# Patient Record
Sex: Male | Born: 1955
Health system: Southern US, Community
[De-identification: ages and names within clinical notes are randomized; demographics above are authoritative.]

## PROBLEM LIST (undated history)

## (undated) ENCOUNTER — Ambulatory Visit: Payer: MEDICARE

## (undated) ENCOUNTER — Telehealth

## (undated) ENCOUNTER — Encounter

## (undated) ENCOUNTER — Ambulatory Visit

## (undated) ENCOUNTER — Ambulatory Visit: Attending: Internal Medicine | Primary: Internal Medicine

## (undated) ENCOUNTER — Ambulatory Visit
Payer: MEDICARE | Attending: Student in an Organized Health Care Education/Training Program | Primary: Student in an Organized Health Care Education/Training Program

## (undated) ENCOUNTER — Inpatient Hospital Stay

## (undated) ENCOUNTER — Encounter
Attending: Student in an Organized Health Care Education/Training Program | Primary: Student in an Organized Health Care Education/Training Program

## (undated) ENCOUNTER — Telehealth
Attending: Student in an Organized Health Care Education/Training Program | Primary: Student in an Organized Health Care Education/Training Program

## (undated) ENCOUNTER — Encounter: Attending: Internal Medicine | Primary: Internal Medicine

## (undated) ENCOUNTER — Telehealth: Attending: Urology | Primary: Urology

## (undated) ENCOUNTER — Ambulatory Visit: Payer: MEDICARE | Attending: Anesthesiology | Primary: Anesthesiology

## (undated) ENCOUNTER — Telehealth: Attending: Family | Primary: Family

## (undated) ENCOUNTER — Ambulatory Visit: Payer: MEDICARE | Attending: Vascular Surgery | Primary: Vascular Surgery

## (undated) ENCOUNTER — Encounter: Payer: MEDICARE | Attending: Pulmonary Disease | Primary: Pulmonary Disease

## (undated) ENCOUNTER — Encounter: Attending: Adult Health | Primary: Adult Health

## (undated) ENCOUNTER — Telehealth: Attending: Internal Medicine | Primary: Internal Medicine

## (undated) ENCOUNTER — Non-Acute Institutional Stay: Payer: MEDICARE

## (undated) ENCOUNTER — Ambulatory Visit: Payer: MEDICARE | Attending: Internal Medicine | Primary: Internal Medicine

## (undated) ENCOUNTER — Ambulatory Visit: Attending: Pharmacist | Primary: Pharmacist

## (undated) ENCOUNTER — Encounter: Attending: Registered" | Primary: Registered"

## (undated) ENCOUNTER — Ambulatory Visit: Attending: Nurse Practitioner | Primary: Nurse Practitioner

## (undated) ENCOUNTER — Encounter: Attending: Anesthesiology | Primary: Anesthesiology

## (undated) ENCOUNTER — Ambulatory Visit: Payer: MEDICARE | Attending: Family | Primary: Family

## (undated) ENCOUNTER — Encounter: Attending: Pharmacist | Primary: Pharmacist

## (undated) ENCOUNTER — Encounter: Attending: Family | Primary: Family

## (undated) ENCOUNTER — Encounter: Payer: MEDICARE | Attending: Pediatrics | Primary: Pediatrics

## (undated) DIAGNOSIS — R519 Headache, unspecified: Secondary | ICD-10-CM

## (undated) DIAGNOSIS — I2510 Atherosclerotic heart disease of native coronary artery without angina pectoris: Secondary | ICD-10-CM

## (undated) DIAGNOSIS — E7800 Pure hypercholesterolemia, unspecified: Secondary | ICD-10-CM

## (undated) DIAGNOSIS — E119 Type 2 diabetes mellitus without complications: Secondary | ICD-10-CM

## (undated) DIAGNOSIS — Q2112 Patent foramen ovale: Secondary | ICD-10-CM

## (undated) DIAGNOSIS — F329 Major depressive disorder, single episode, unspecified: Secondary | ICD-10-CM

## (undated) DIAGNOSIS — F32A Depression, unspecified: Secondary | ICD-10-CM

## (undated) DIAGNOSIS — G4733 Obstructive sleep apnea (adult) (pediatric): Secondary | ICD-10-CM

## (undated) DIAGNOSIS — K279 Peptic ulcer, site unspecified, unspecified as acute or chronic, without hemorrhage or perforation: Secondary | ICD-10-CM

## (undated) DIAGNOSIS — R51 Headache: Secondary | ICD-10-CM

## (undated) DIAGNOSIS — I5030 Unspecified diastolic (congestive) heart failure: Secondary | ICD-10-CM

## (undated) DIAGNOSIS — Q211 Atrial septal defect: Secondary | ICD-10-CM

## (undated) DIAGNOSIS — I10 Essential (primary) hypertension: Secondary | ICD-10-CM

## (undated) HISTORY — PX: SHOULDER SURGERY: SHX246

## (undated) HISTORY — PX: CORONARY ANGIOPLASTY WITH STENT PLACEMENT: SHX49

## (undated) HISTORY — PX: PROSTATE SURGERY: SHX751

## (undated) HISTORY — PX: OTHER SURGICAL HISTORY: SHX169

## (undated) HISTORY — PX: JOINT REPLACEMENT: SHX530

## (undated) HISTORY — PX: HERNIA REPAIR: SHX51

## (undated) MED ORDER — MEDICAL SUPPLY ITEM: Freq: Every evening | 0 days

## (undated) MED ORDER — SULFAMETHOXAZOLE 400 MG-TRIMETHOPRIM 80 MG TABLET: Freq: Two times a day (BID) | ORAL | 0 days

## (undated) MED ORDER — OZEMPIC 0.25 MG OR 0.5 MG (2 MG/1.5 ML) SUBCUTANEOUS PEN INJECTOR: SUBCUTANEOUS | 0 days

## (undated) MED ORDER — ISOSORBIDE DINITRATE ORAL: ORAL | 0.00000 days

## (undated) MED ORDER — TESTOSTERONE CYPIONATE 200 MG/ML INTRAMUSCULAR OIL: INTRAMUSCULAR | 0 days

## (undated) MED ORDER — BUPROPION HCL 100 MG TABLET: Freq: Two times a day (BID) | ORAL | 0.00000 days

## (undated) MED ORDER — DULOXETINE 30 MG CAPSULE,DELAYED RELEASE: Freq: Every day | ORAL | 0 days

## (undated) MED ORDER — FARXIGA ORAL: Freq: Every day | ORAL | 0 days

## (undated) MED ORDER — ROPINIROLE 2 MG TABLET: Freq: Every evening | ORAL | 0 days

## (undated) MED ORDER — LISINOPRIL 10 MG TABLET: Freq: Every day | ORAL | 0 days

---

## 2006-12-03 ENCOUNTER — Encounter: Admission: RE | Admit: 2006-12-03 | Discharge: 2006-12-03 | Payer: Self-pay | Admitting: Specialist

## 2010-03-10 ENCOUNTER — Encounter: Payer: Self-pay | Admitting: Specialist

## 2012-04-10 DIAGNOSIS — K219 Gastro-esophageal reflux disease without esophagitis: Secondary | ICD-10-CM

## 2012-04-10 DIAGNOSIS — E785 Hyperlipidemia, unspecified: Secondary | ICD-10-CM | POA: Insufficient documentation

## 2012-04-10 DIAGNOSIS — I10 Essential (primary) hypertension: Secondary | ICD-10-CM | POA: Insufficient documentation

## 2012-04-10 DIAGNOSIS — E1165 Type 2 diabetes mellitus with hyperglycemia: Secondary | ICD-10-CM | POA: Insufficient documentation

## 2012-04-10 HISTORY — DX: Essential (primary) hypertension: I10

## 2012-04-10 HISTORY — DX: Gastro-esophageal reflux disease without esophagitis: K21.9

## 2012-07-02 DIAGNOSIS — I219 Acute myocardial infarction, unspecified: Secondary | ICD-10-CM | POA: Insufficient documentation

## 2012-07-02 DIAGNOSIS — I252 Old myocardial infarction: Secondary | ICD-10-CM | POA: Insufficient documentation

## 2013-02-24 ENCOUNTER — Encounter (HOSPITAL_COMMUNITY): Payer: Self-pay | Admitting: Emergency Medicine

## 2013-02-24 ENCOUNTER — Inpatient Hospital Stay (HOSPITAL_COMMUNITY)
Admission: EM | Admit: 2013-02-24 | Discharge: 2013-02-25 | DRG: 313 | Disposition: A | Discharge status: Home / Self Care | Payer: Medicare PPO | Attending: Internal Medicine | Admitting: Internal Medicine

## 2013-02-24 ENCOUNTER — Emergency Department (HOSPITAL_COMMUNITY): Payer: Medicare PPO

## 2013-02-24 DIAGNOSIS — Z79899 Other long term (current) drug therapy: Secondary | ICD-10-CM

## 2013-02-24 DIAGNOSIS — Z7982 Long term (current) use of aspirin: Secondary | ICD-10-CM

## 2013-02-24 DIAGNOSIS — I200 Unstable angina: Secondary | ICD-10-CM

## 2013-02-24 DIAGNOSIS — R079 Chest pain, unspecified: Secondary | ICD-10-CM | POA: Diagnosis present

## 2013-02-24 DIAGNOSIS — I10 Essential (primary) hypertension: Secondary | ICD-10-CM

## 2013-02-24 DIAGNOSIS — F329 Major depressive disorder, single episode, unspecified: Secondary | ICD-10-CM | POA: Diagnosis present

## 2013-02-24 DIAGNOSIS — M545 Low back pain, unspecified: Secondary | ICD-10-CM | POA: Diagnosis present

## 2013-02-24 DIAGNOSIS — I2510 Atherosclerotic heart disease of native coronary artery without angina pectoris: Secondary | ICD-10-CM | POA: Diagnosis present

## 2013-02-24 DIAGNOSIS — G8929 Other chronic pain: Secondary | ICD-10-CM | POA: Diagnosis present

## 2013-02-24 DIAGNOSIS — E785 Hyperlipidemia, unspecified: Secondary | ICD-10-CM | POA: Diagnosis present

## 2013-02-24 DIAGNOSIS — Z8249 Family history of ischemic heart disease and other diseases of the circulatory system: Secondary | ICD-10-CM

## 2013-02-24 DIAGNOSIS — E119 Type 2 diabetes mellitus without complications: Secondary | ICD-10-CM | POA: Diagnosis present

## 2013-02-24 DIAGNOSIS — R0789 Other chest pain: Principal | ICD-10-CM | POA: Diagnosis present

## 2013-02-24 DIAGNOSIS — Z9861 Coronary angioplasty status: Secondary | ICD-10-CM

## 2013-02-24 DIAGNOSIS — I509 Heart failure, unspecified: Secondary | ICD-10-CM

## 2013-02-24 DIAGNOSIS — F3289 Other specified depressive episodes: Secondary | ICD-10-CM

## 2013-02-24 HISTORY — DX: Essential (primary) hypertension: I10

## 2013-02-24 HISTORY — DX: Pure hypercholesterolemia, unspecified: E78.00

## 2013-02-24 HISTORY — DX: Type 2 diabetes mellitus without complications: E11.9

## 2013-02-24 HISTORY — DX: Atherosclerotic heart disease of native coronary artery without angina pectoris: I25.10

## 2013-02-24 HISTORY — DX: Peptic ulcer, site unspecified, unspecified as acute or chronic, without hemorrhage or perforation: K27.9

## 2013-02-24 HISTORY — DX: Unstable angina: I20.0

## 2013-02-24 LAB — CBC
HCT: 42.3 % (ref 39.0–52.0)
Hemoglobin: 14.4 g/dL (ref 13.0–17.0)
MCH: 29.6 pg (ref 26.0–34.0)
MCHC: 34 g/dL (ref 30.0–36.0)
MCV: 86.9 fL (ref 78.0–100.0)
Platelets: 222 10*3/uL (ref 150–400)
RBC: 4.87 MIL/uL (ref 4.22–5.81)
RDW: 14.3 % (ref 11.5–15.5)
WBC: 7.8 10*3/uL (ref 4.0–10.5)

## 2013-02-24 LAB — COMPREHENSIVE METABOLIC PANEL
ALBUMIN: 4 g/dL (ref 3.5–5.2)
ALK PHOS: 101 U/L (ref 39–117)
ALT: 23 U/L (ref 0–53)
AST: 16 U/L (ref 0–37)
BUN: 14 mg/dL (ref 6–23)
CO2: 27 meq/L (ref 19–32)
Calcium: 9.9 mg/dL (ref 8.4–10.5)
Chloride: 99 mEq/L (ref 96–112)
Creatinine, Ser: 0.9 mg/dL (ref 0.50–1.35)
GFR calc Af Amer: >90 mL/min (ref >90)
GFR calc non Af Amer: >90 mL/min (ref >90)
GLUCOSE: 133 mg/dL — AB (ref 70–99)
Potassium: 4.3 mEq/L (ref 3.7–5.3)
SODIUM: 140 meq/L (ref 137–147)
Total Bilirubin: 0.4 mg/dL (ref 0.3–1.2)
Total Protein: 7.5 g/dL (ref 6.0–8.3)

## 2013-02-24 LAB — PROTIME-INR
INR: 1.01 (ref 0.00–1.49)
Prothrombin Time: 13.1 seconds (ref 11.6–15.2)

## 2013-02-24 LAB — APTT: aPTT: 26 seconds (ref 24–37)

## 2013-02-24 LAB — POCT I-STAT TROPONIN I
Troponin i, poc: 0 ng/mL (ref 0.00–0.08)
Troponin i, poc: 0 ng/mL (ref 0.00–0.08)

## 2013-02-24 LAB — PRO B NATRIURETIC PEPTIDE: PRO B NATRI PEPTIDE: 29.1 pg/mL (ref 0–125)

## 2013-02-24 LAB — GLUCOSE, CAPILLARY: Glucose-Capillary: 172 mg/dL — ABNORMAL HIGH (ref 70–99)

## 2013-02-24 LAB — TROPONIN I

## 2013-02-24 IMAGING — CR DG CHEST 2V
2 series · 2 of 2 positions shown · non-contrast
Comparison: [DATE] PA and lateral chest x-ray

CLINICAL DATA: Chest pain with history of cardiac stent placement

EXAM:
CHEST  2 VIEW

[w chest pa]
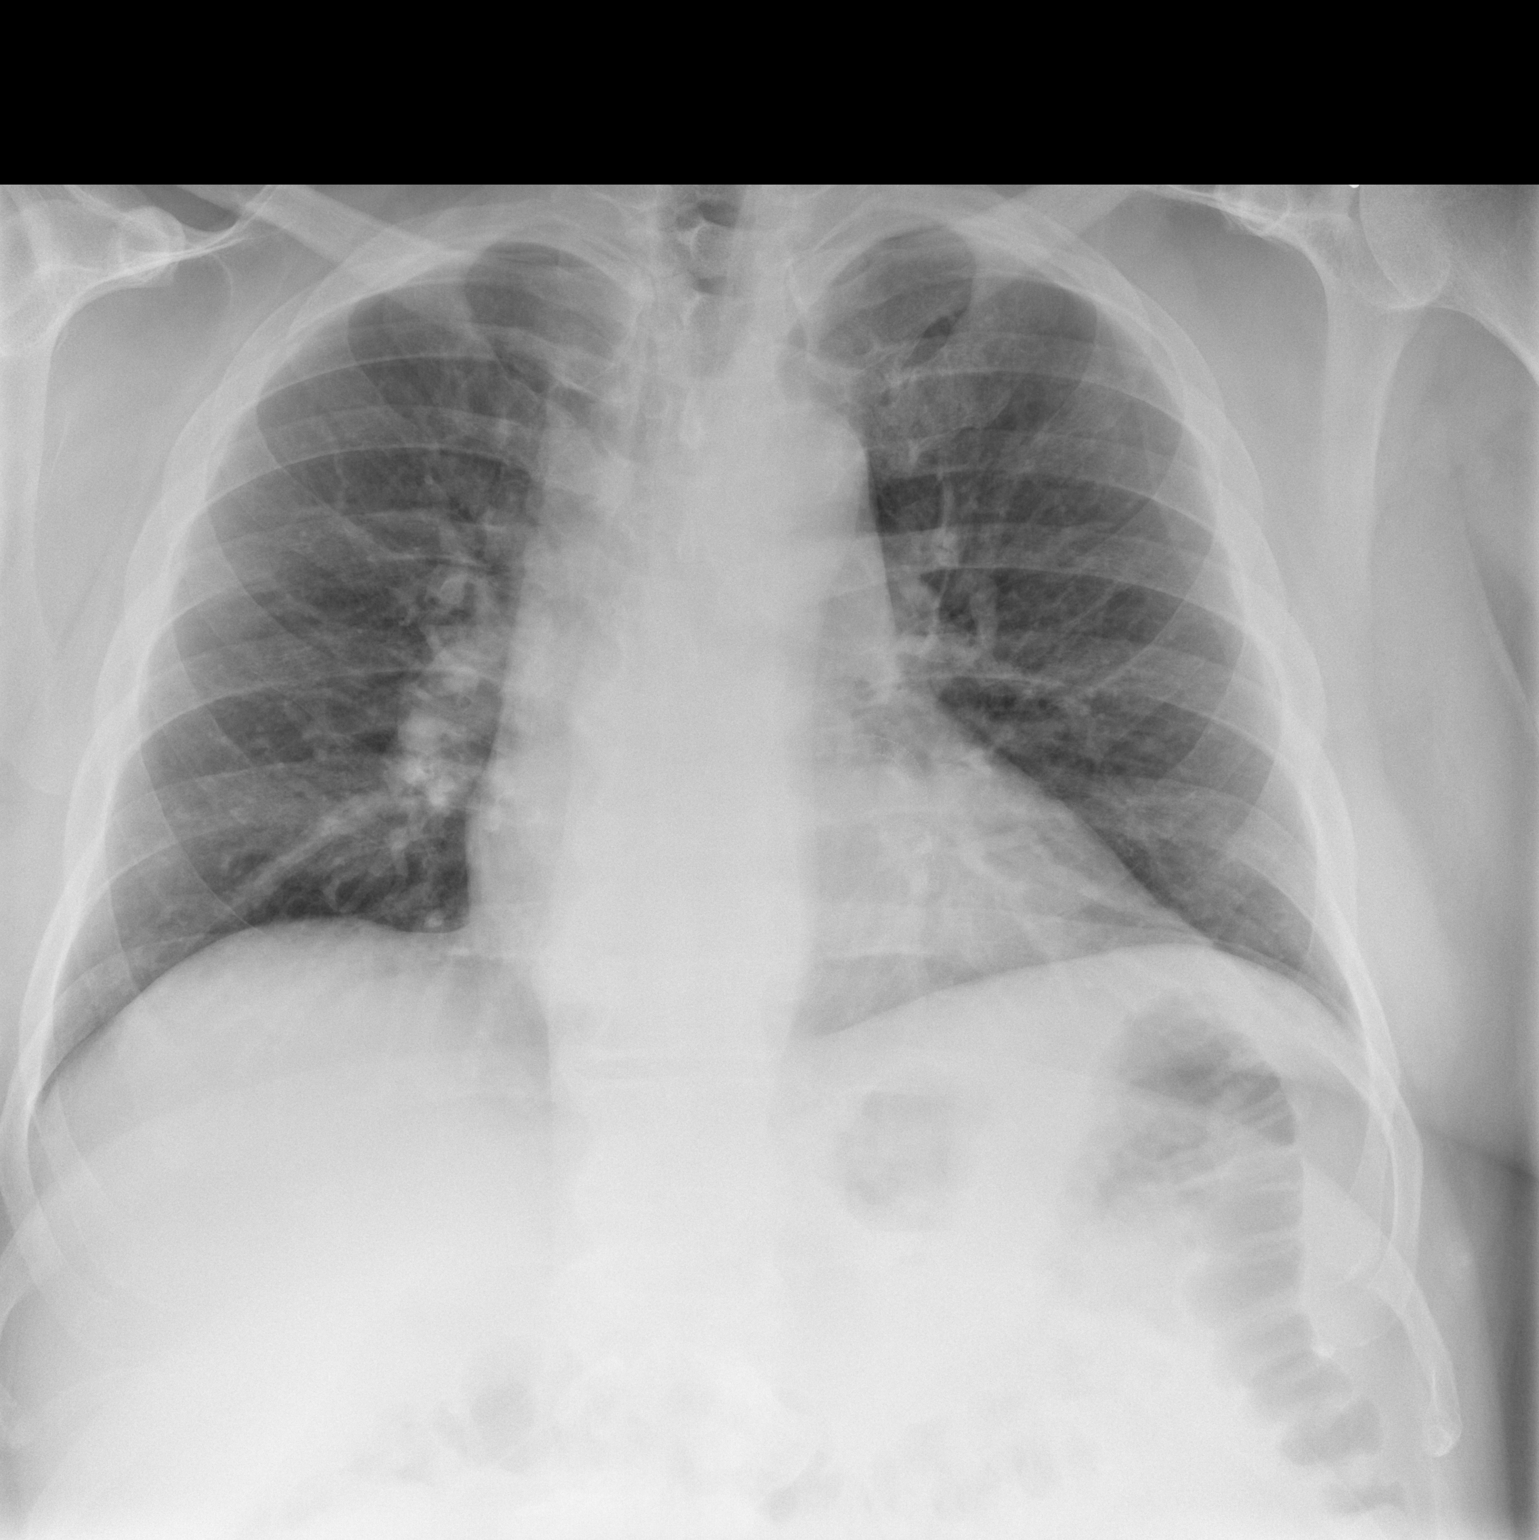

[w chest lat]
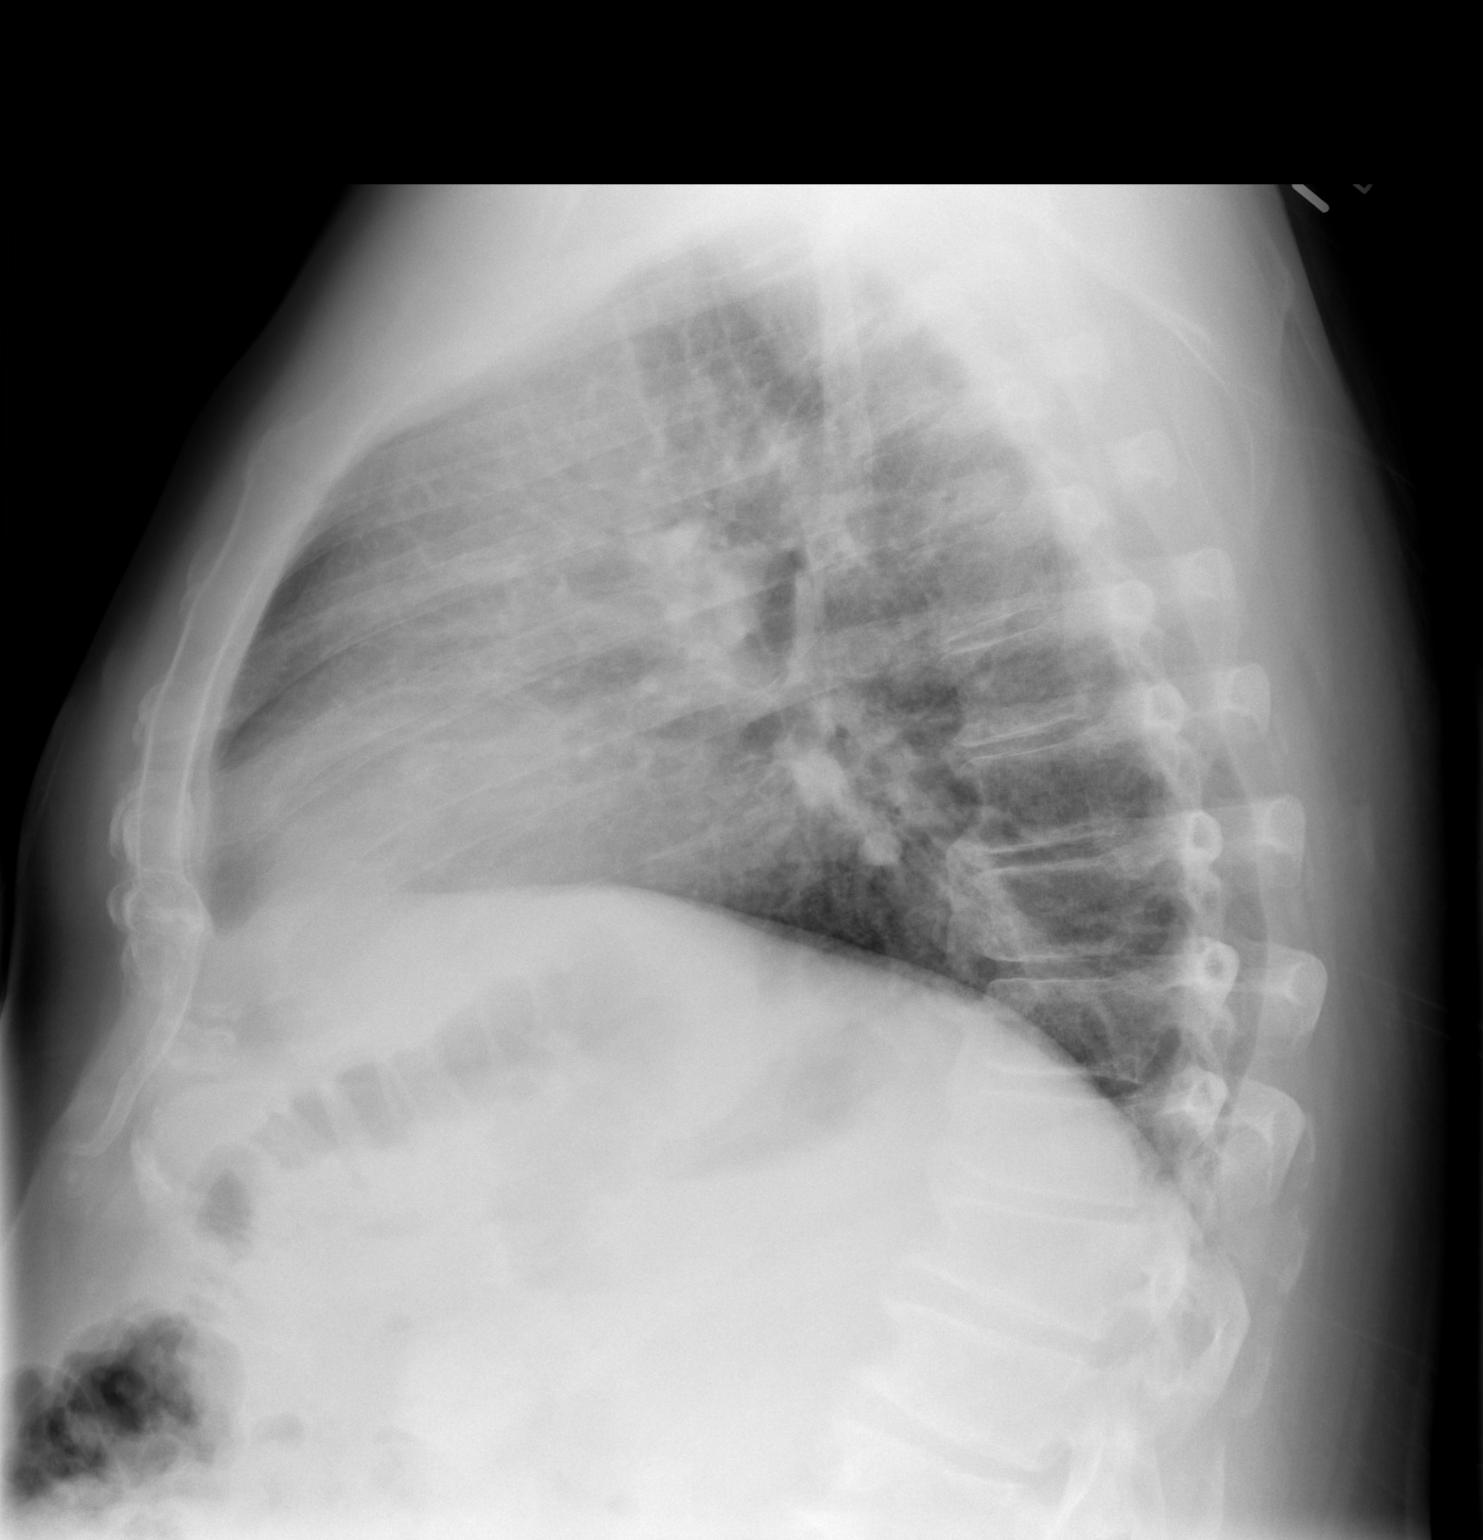

[2 of 2 positions shown; findings below may reference images not displayed]

FINDINGS: The lungs are borderline hypoinflated. The interstitial markings are
mildly increased but not greatly changed from the previous study.
The cardiopericardial silhouette is enlarged though stable. The
central pulmonary vascularity is prominent. There is no pleural
effusion or pneumothorax. The mediastinum is normal in width. There
is tortuosity of the descending thoracic aorta.
IMPRESSION: The findings suggest low-grade CHF with mild pulmonary interstitial
edema. There is no alveolar pneumonia.

## 2013-02-24 MED ORDER — ONDANSETRON HCL 4 MG/2ML IJ SOLN
4.0000 mg | Freq: Once | INTRAMUSCULAR | Status: AC
  Administered 2013-02-24: 4 mg via INTRAVENOUS
  Filled 2013-02-24: qty 2

## 2013-02-24 MED ORDER — METOCLOPRAMIDE HCL 5 MG/ML IJ SOLN
5.0000 mg | Freq: Once | INTRAMUSCULAR | Status: AC
  Administered 2013-02-24: 5 mg via INTRAVENOUS
  Filled 2013-02-24: qty 2

## 2013-02-24 MED ORDER — GABAPENTIN 300 MG PO CAPS
300.0000 mg | ORAL_CAPSULE | Freq: Two times a day (BID) | ORAL | Status: DC
  Administered 2013-02-25 (×2): 300 mg via ORAL
  Filled 2013-02-24 (×3): qty 1

## 2013-02-24 MED ORDER — ONDANSETRON HCL 4 MG/2ML IJ SOLN
4.0000 mg | Freq: Four times a day (QID) | INTRAMUSCULAR | Status: DC | PRN
Start: 2013-02-24 — End: 2013-02-25
  Administered 2013-02-24: 4 mg via INTRAVENOUS

## 2013-02-24 MED ORDER — CLOPIDOGREL BISULFATE 75 MG PO TABS
75.0000 mg | ORAL_TABLET | Freq: Every day | ORAL | Status: DC
  Administered 2013-02-25: 75 mg via ORAL
  Filled 2013-02-24: qty 1

## 2013-02-24 MED ORDER — SODIUM CHLORIDE 0.9 % IJ SOLN: 3.0000 mL | Freq: Two times a day (BID) | INTRAMUSCULAR | Status: DC

## 2013-02-24 MED ORDER — ACETAMINOPHEN 325 MG PO TABS
650.0000 mg | ORAL_TABLET | ORAL | Status: DC | PRN
  Administered 2013-02-24: 650 mg via ORAL
  Filled 2013-02-24: qty 2

## 2013-02-24 MED ORDER — RAMELTEON 8 MG PO TABS
8.0000 mg | ORAL_TABLET | Freq: Every day | ORAL | Status: DC
  Filled 2013-02-24: qty 1

## 2013-02-24 MED ORDER — DULOXETINE HCL 60 MG PO CPEP
60.0000 mg | ORAL_CAPSULE | Freq: Every day | ORAL | Status: DC
  Administered 2013-02-24 – 2013-02-25 (×2): 60 mg via ORAL
  Filled 2013-02-24 (×2): qty 1

## 2013-02-24 MED ORDER — ASPIRIN EC 81 MG PO TBEC
81.0000 mg | DELAYED_RELEASE_TABLET | Freq: Every day | ORAL | Status: DC
  Administered 2013-02-25: 81 mg via ORAL
  Filled 2013-02-24: qty 1

## 2013-02-24 MED ORDER — NITROGLYCERIN 0.3 MG SL SUBL
0.3000 mg | SUBLINGUAL_TABLET | SUBLINGUAL | Status: DC | PRN
  Administered 2013-02-24: 0.3 mg via SUBLINGUAL
  Filled 2013-02-24: qty 100

## 2013-02-24 MED ORDER — NITROGLYCERIN IN D5W 200-5 MCG/ML-% IV SOLN
INTRAVENOUS | Status: AC
  Filled 2013-02-24: qty 250

## 2013-02-24 MED ORDER — PAROXETINE HCL 20 MG PO TABS
20.0000 mg | ORAL_TABLET | Freq: Every day | ORAL | Status: DC
  Administered 2013-02-24 – 2013-02-25 (×2): 20 mg via ORAL
  Filled 2013-02-24 (×2): qty 1

## 2013-02-24 MED ORDER — FUROSEMIDE 10 MG/ML IJ SOLN
40.0000 mg | Freq: Once | INTRAMUSCULAR | Status: AC
  Administered 2013-02-24: 40 mg via INTRAVENOUS
  Filled 2013-02-24: qty 4

## 2013-02-24 MED ORDER — HYDROMORPHONE HCL PF 1 MG/ML IJ SOLN
0.5000 mg | Freq: Once | INTRAMUSCULAR | Status: AC
  Administered 2013-02-24: 0.5 mg via INTRAVENOUS
  Filled 2013-02-24: qty 1

## 2013-02-24 MED ORDER — ATORVASTATIN CALCIUM 80 MG PO TABS
80.0000 mg | ORAL_TABLET | Freq: Every day | ORAL | Status: DC
  Administered 2013-02-24 – 2013-02-25 (×2): 80 mg via ORAL
  Filled 2013-02-24 (×2): qty 1

## 2013-02-24 MED ORDER — HEPARIN (PORCINE) IN NACL 100-0.45 UNIT/ML-% IJ SOLN
1500.0000 [IU]/h | INTRAMUSCULAR | Status: DC
  Administered 2013-02-24: 1500 [IU]/h via INTRAVENOUS
  Administered 2013-02-25: 1800 [IU]/h via INTRAVENOUS
  Filled 2013-02-24 (×3): qty 250

## 2013-02-24 MED ORDER — ISOSORBIDE MONONITRATE ER 30 MG PO TB24
30.0000 mg | ORAL_TABLET | Freq: Every day | ORAL | Status: DC
  Administered 2013-02-25: 30 mg via ORAL
  Filled 2013-02-24: qty 1

## 2013-02-24 MED ORDER — ASPIRIN 325 MG PO TABS
325.0000 mg | ORAL_TABLET | ORAL | Status: AC
  Administered 2013-02-24: 325 mg via ORAL
  Filled 2013-02-24: qty 1

## 2013-02-24 MED ORDER — ONDANSETRON HCL 4 MG/2ML IJ SOLN
INTRAMUSCULAR | Status: AC
  Administered 2013-02-24: 18:00:00 4 mg via INTRAVENOUS
  Filled 2013-02-24: qty 2

## 2013-02-24 MED ORDER — TESTOSTERONE 20.25 MG/ACT (1.62%) TD GEL: 6.0000 | Freq: Every day | TRANSDERMAL | Status: DC

## 2013-02-24 MED ORDER — MORPHINE SULFATE 4 MG/ML IJ SOLN
4.0000 mg | INTRAMUSCULAR | Status: AC
  Administered 2013-02-24: 4 mg via INTRAVENOUS
  Filled 2013-02-24: qty 1

## 2013-02-24 MED ORDER — GABAPENTIN 300 MG PO CAPS
900.0000 mg | ORAL_CAPSULE | Freq: Every day | ORAL | Status: DC
  Administered 2013-02-24: 900 mg via ORAL
  Filled 2013-02-24 (×2): qty 3

## 2013-02-24 MED ORDER — NITROGLYCERIN IN D5W 200-5 MCG/ML-% IV SOLN
2.0000 ug/min | INTRAVENOUS | Status: DC
  Administered 2013-02-24: 20 ug/min via INTRAVENOUS

## 2013-02-24 MED ORDER — RANOLAZINE ER 500 MG PO TB12
1000.0000 mg | ORAL_TABLET | Freq: Two times a day (BID) | ORAL | Status: DC
  Administered 2013-02-24 – 2013-02-25 (×2): 1000 mg via ORAL
  Filled 2013-02-24 (×3): qty 2

## 2013-02-24 MED ORDER — PROMETHAZINE HCL 25 MG/ML IJ SOLN
12.5000 mg | Freq: Once | INTRAMUSCULAR | Status: AC
  Administered 2013-02-24: 12.5 mg via INTRAVENOUS
  Filled 2013-02-24: qty 1

## 2013-02-24 MED ORDER — HEPARIN BOLUS VIA INFUSION
4000.0000 [IU] | Freq: Once | INTRAVENOUS | Status: AC
  Administered 2013-02-24: 4000 [IU] via INTRAVENOUS
  Filled 2013-02-24: qty 4000

## 2013-02-24 NOTE — ED Notes (Addendum)
Attempted to call report to care nurse, but nurse not available currently. HUC indicated that RN will call this RN when available.

## 2013-02-24 NOTE — H&P (Signed)
Date: 02/24/2013               Patient Name:  Benjamin Bush MRN: [id number]  DOB: July 20, 1955 Age / Sex: 58 y.o., male   PCP: No Pcp Per Patient         Medical Service: Internal Medicine Teaching Service         Attending Physician: Dr. Inez Catalina, MD    First Contact: Dr. Darci Needle Pager: [phone]  Second Contact: Dr. Virgina Organ Pager: [phone]       After Hours (After 5p/  First Contact Pager: [phone]  weekends / holidays): Second Contact Pager: [phone]   Chief Complaint: chest pain  History of Present Illness:  This is a 57yo WM w/ PMH HTN, DMT2, HLD, CHF (unknown echo results), known CAD w/ 2 stents (2014) who presents with chest pain.  Patient is visiting from [place] area to see his wife's cousin who is currently hospitalized. He has been in his usual sate of health until this morning when he was sitting in the waiting room when he had sudden onset of L sharp chest pain w/ associated SOB and nausea, no diaphoresis. Pt took 1 NTG tablet which improved the pain, but did not relieve it completely so he went to the ED. In the ED patient was given another NTG tablet which did not help. Patient got some morphine IV which brought the pain down to a 2/10. The pain has gradually worsened again and is now a 4/10. He has some residual nausea, no vomiting. Pain has been constant since onset and does not radiate anywhere. He has some chronic lower back pain, no changes. Patient has intermittent BLE swelling and he notes his legs were swollen a few days ago, but this resolved after taking lasix 20mg  (which he takes prn at home). Patient is compliant with all medications and took all medications this morning, however he has been out of his Ranexa x 4 days. Denies URI symptoms.   In the ED, afebrile, BP 129/67, HR 59, RR 20, satting 94% on 2LPM. BMP and CBC wnl. Trop neg x 1. BNP wnl. CXR showed mild interstitial edema. Patient received lasix 40mg  IV, ASA 325mg , morphine, dilauded, reglan, and  phenergan.  Of note, patient tells [place] he is in the process of getting home O2 set up, but denies h/o lung diseases and is not sure why he requires supplemental oxygen.   Meds: Current Facility-Administered Medications  Medication Dose Route Frequency Provider Last Rate Last Dose  . nitroGLYCERIN (NITROSTAT) SL tablet 0.3 mg  0.3 mg Sublingual Q5 min PRN Junius Argyle, MD   0.3 mg at 02/24/13 1143  . nitroGLYCERIN 0.2 mg/mL in dextrose 5 % infusion  2-200 mcg/min Intravenous Titrated Junius Argyle, MD      . nitroGLYCERIN 0.2 mg/mL in dextrose 5 % infusion            Current Outpatient Prescriptions  Medication Sig Dispense Refill  . amLODipine (NORVASC) 10 MG tablet Take 20 mg by mouth daily.      Marland Kitchen aspirin EC 81 MG tablet Take 81 mg by mouth daily.      Marland Kitchen atorvastatin (LIPITOR) 80 MG tablet Take 80 mg by mouth daily.      . clopidogrel (PLAVIX) 75 MG tablet Take 75 mg by mouth daily with breakfast.      . diclofenac sodium (VOLTAREN) 1 % GEL Apply 4 g topically 4 (four) times daily as needed.      Marland Kitchen  DULoxetine (CYMBALTA) 60 MG capsule Take 60 mg by mouth daily.      . furosemide (LASIX) 20 MG tablet Take 20 mg by mouth daily as needed. edema      . gabapentin (NEURONTIN) 300 MG capsule Take 300-900 mg by mouth 3 (three) times daily. 1 capsule at am and lunch and 3 capsules at bedtime.      Marland Kitchen glipiZIDE (GLUCOTROL XL) 10 MG 24 hr tablet Take 10 mg by mouth daily with breakfast.      . isosorbide mononitrate (IMDUR) 30 MG 24 hr tablet Take 30 mg by mouth daily.      . metFORMIN (GLUCOPHAGE) 500 MG tablet Take 500 mg by mouth 2 (two) times daily with a meal.      . metoprolol (LOPRESSOR) 100 MG tablet Take 100 mg by mouth 2 (two) times daily.      Marland Kitchen PARoxetine (PAXIL) 20 MG tablet Take 20 mg by mouth daily.      . potassium chloride SA (K-DUR,KLOR-CON) 20 MEQ tablet Take 20 mEq by mouth daily.      . ranolazine (RANEXA) 1000 MG SR tablet Take 1,000 mg by mouth 2 (two) times daily.       . Testosterone (ANDROGEL PUMP) 20.25 MG/ACT (1.62%) GEL Place 6 sprays onto the skin daily.       Allergies: Allergies as of 02/24/2013 - Review Complete 02/24/2013  Allergen Reaction Noted  . Codeine Swelling 02/24/2013   Past Medical History  Diagnosis Date  . Hypertension   . Diabetes mellitus without complication   . Hypercholesteremia   . Congestive heart failure (CHF)    Past Surgical History  Procedure Laterality Date  . Coronary angioplasty with stent placement    . Hernia repair    . Joint replacement Left     knee replacement  . Vetebral fusion     No family history on file. History   Social History  . Marital Status: Married    Spouse Name: N/A    Number of Children: N/A  . Years of Education: N/A   Occupational History  . Not on file.   Social History Main Topics  . Smoking status: Never Smoker   . Smokeless tobacco: Current User    Types: Chew  . Alcohol Use: No  . Drug Use: No  . Sexual Activity: Not on file   Other Topics Concern  . Not on file   Social History Narrative  . No narrative on file    Review of Systems: General: no fevers, chills, changes in weight, changes in appetite Skin: no rash HEENT: no blurry vision, hearing changes, sore throat Pulm: no dyspnea, coughing, wheezing CV: see HPI Abd: see HPI GU: no dysuria, hematuria, polyuria Ext: no arthralgias, myalgias Neuro: no weakness, numbness, or tingling  Physical Exam: Blood pressure 129/67, pulse 55, temperature 98 F (36.7 C), temperature source Oral, resp. rate 17, height 6' (1.829 m), weight 322 lb (146.058 kg), SpO2 95.00%. on 2LPM (desatted to 89% on room air)  General: alert, cooperative, and in no apparent distress HEENT: pupils equal round and reactive to light, vision grossly intact, oropharynx clear and non-erythematous; MMM  Neck: supple, no JVD Lungs: clear to ascultation bilaterally, normal work of respiration, no wheezes, rales, ronchi Heart: regular  rate and rhythm, no murmurs, gallops, or rubs Abdomen: soft, non-tender, non-distended, normal bowel sounds Extremities: warm extremities, trace BLE to L lower leg Neurologic: alert & oriented X3, cranial nerves II-XII grossly intact, strength grossly  intact, sensation intact to light touch  Lab results: Basic Metabolic Panel:  Recent Labs  56/21/2999/10/01 1135  NA 140  K 4.3  CL 99  CO2 27  GLUCOSE 133*  BUN 14  CREATININE 0.90  CALCIUM 9.9   Liver Function Tests:  Recent Labs  02/24/13 1135  AST 16  ALT 23  ALKPHOS 101  BILITOT 0.4  PROT 7.5  ALBUMIN 4.0   CBC:  Recent Labs  02/24/13 1135  WBC 7.8  HGB 14.4  HCT 42.3  MCV 86.9  PLT 222   BNP:  Recent Labs  02/24/13 1135  PROBNP 29.1   Imaging results:  Dg Chest 2 View  02/24/2013   CLINICAL DATA:  Chest pain with history of cardiac stent placement  EXAM: CHEST  2 VIEW  COMPARISON:  March 23, 2009 PA and lateral chest x-ray  FINDINGS: The lungs are borderline hypoinflated. The interstitial markings are mildly increased but not greatly changed from the previous study. The cardiopericardial silhouette is enlarged though stable. The central pulmonary vascularity is prominent. There is no pleural effusion or pneumothorax. The mediastinum is normal in width. There is tortuosity of the descending thoracic aorta.  IMPRESSION: The findings suggest low-grade CHF with mild pulmonary interstitial edema. There is no alveolar pneumonia.   Electronically Signed   By: David  [place]   On: 02/24/2013 11:16   Other results: EKG: NSR, normal intervals and axis, no ischemic changes.   Assessment & Plan by Problem:  # Unstable angina vs NSTEMI: Patient's story is c/w anginal chest pain and given the pain occurred at rest this is likely UA vs NSTEMI. He has history of known CAD w/ 2 stents as well as HLD, HTN, DM- therefore, she is at high risk for ACS. TIMI score 4 (20% risk all cause mortality, MI in 14 days). Other DDX: anxiety,  costochondritis, MSK pain- though these are all less likely than ACS given the patient's history. Considered aortic dissection, though no new back pain and mediastinum was not wide on CXR. PE less likely given patient scores a 0.0 on modified geneva score. -admit to SDU -heparin gtt -cycle CE -cardiology consult, appreciate recs -continue plavix and ASA -continue lipitor, IMDUR -NTG gtt -CBC, PTT, PT/INR -EKG in AM -HbA1c, TSH -supplemental O2 to keep O2 sats >92% -continued home ranexa -NPO -zofran prn   # Depression: Stable. Continue duloxetine, paroxetine.  # DM type 2: Unknown A1c. Will hold metformin and glipizide for now and monitor CBGs overnight. -consider SSI in AM if CBGs uncontrolled -repeat A1c -continue gabapentin  # CHF: Per patient, he had an echo this year, but does not know the result. Pt euvolemic on exam, though with mild interstitial edema on CXR. S/p lasix 40mg  IV in ED w/ good diuresis. Unknown weight baseline.  -monitor I/O, daily weights -continue CHF medications as above  # VTE: heparin gtt  # Diet: HH  Code status:full  Dispo: Disposition is deferred at this time, awaiting improvement of current medical problems. Anticipated discharge in approximately 1-2 day(s).   The patient does have a current PCP (No Pcp Per Patient) and [facility] not [facility] after discharge.  The patient does not have transportation limitations that hinder transportation to clinic appointments.  Signed: Windell Hummingbirdachel Rosamaria Donn, MD 02/24/2013, 5:53 PM

## 2013-02-24 NOTE — Progress Notes (Signed)
Unit CM UR Completed by [facility] ED CM  W. Tomi Grandpre RN  

## 2013-02-24 NOTE — ED Notes (Signed)
Pt reports left sided sharp CP that started at 1000 this morning while resting. States that he took 1 nitro, relieving from 9/10 to 7/10. Pt has hx stent. States he was nausea, flushed and SOB with CP. AO x4. Sinus rhythm on monitor.

## 2013-02-24 NOTE — Progress Notes (Signed)
ANTICOAGULATION CONSULT NOTE - Initial Consult  Pharmacy Consult for heparin Indication: chest pain/ACS  Allergies  Allergen Reactions  . Codeine Swelling    Patient Measurements: Height: 6' (182.9 cm) Weight: 322 lb (146.058 kg) IBW/kg (Calculated) : 77.6 Heparin Dosing Weight: 112 kg  Vital Signs: Temp: 98 F (36.7 C) (01/08 1122) Temp src: Oral (01/08 1122) BP: 129/67 mmHg (01/08 1459) Pulse Rate: 55 (01/08 1459)  Labs:  Recent Labs  02/24/13 1135  HGB 14.4  HCT 42.3  PLT 222  CREATININE 0.90    Estimated Creatinine Clearance: 137.7 ml/min (by C-G formula based on Cr of 0.9).   Medical History: Past Medical History  Diagnosis Date  . Hypertension   . Diabetes mellitus without complication   . Hypercholesteremia     Assessment: 89 YOM who was visiting a family member and was in the waiting room when he had onset of chest pain. Note in EDP note that patient stated he ran out of his Ranexa and hasn't taken for a few days. Confirmed with him that he is NOT on anticoagulants PTA. No bleeding or bruising. Baseline Hgb and Plts are WNL.  Goal of Therapy:  Heparin level 0.3-0.7 units/ml Monitor platelets by anticoagulation protocol: Yes   Plan:  1. Stat aPTT and INR for baseline values 2. Heparin bolus with 4000 units IV x1 3. Start heparin drip at 1500 units/hr 4. Heparin level in 6 hours 5. Daily HL and CBC 6. Follow for long term plan  Shonte Beutler D. Jaculin Rasmus, PharmD, BCPS Clinical Pharmacist Pager: [phone] 02/24/2013 5:53 PM

## 2013-02-24 NOTE — Consult Note (Signed)
HPI: and 58 year old male with past medical history of coronary artery disease, hypertension, diabetes mellitus, hyperlipidemia for evaluation of chest pain. Patient is visiting from [facility]. In March of 2014 he had stent placed. I do not have records available. He apparently has had problems with recurrent chest pain since that time. He states he has been admitted multiple times and has been treated medically. He is on ranexa. He was visiting a cousin who is having surgery today and developed substernal chest pain described as a sharp sensation. No radiation. The pain was not pleuritic, positional, related to food. There was mild nausea and dyspnea but no diaphoresis. He took one nitroglycerin with no change. He was brought to the emergency room and took another nitroglycerin without change. His pain lasted for approximately 15 minutes and now is mild by his report. Cardiology is asked to evaluate. He has mild dyspnea on exertion. He has fatigue with exertion but his chest pain occurs both with exertion and at rest and apparently is chronic.   (Not in a hospital admission)  Allergies  Allergen Reactions  . Codeine Swelling    Past Medical History  Diagnosis Date  . Hypertension   . Diabetes mellitus without complication   . Hypercholesteremia   . Congestive heart failure (CHF)   . CAD (coronary artery disease)   . PUD (peptic ulcer disease)     Past Surgical History  Procedure Laterality Date  . Coronary angioplasty with stent placement    . Hernia repair    . Joint replacement Left     knee replacement  . Vetebral fusion    . Shoulder surgery      History   Social History  . Marital Status: Married    Spouse Name: N/A    Number of Children: 2  . Years of Education: N/A   Occupational History  .      Disabled   Social History Main Topics  . Smoking status: Never Smoker   . Smokeless tobacco: Current User    Types: Chew  . Alcohol Use: No  .  Drug Use: No  . Sexual Activity: Not on file   Other Topics Concern  . Not on file   Social History Narrative  . No narrative on file    Family History  Problem Relation Age of Onset  . CAD Mother     MI in her 38s  . CAD Father     CABG in his 9s    ROS:  Chronic back pain but no fevers or chills, productive cough, hemoptysis, dysphasia, odynophagia, melena, hematochezia, dysuria, hematuria, rash, seizure activity, orthopnea, PND, pedal edema, claudication. Remaining systems are negative.  Physical Exam:   Blood pressure 114/65, pulse 55, temperature 98 F (36.7 C), temperature source Oral, resp. rate 18, height 6' (1.829 m), weight 322 lb (146.058 kg), SpO2 99.00%.  General:  Well developed/well nourished in NAD Skin warm/dry Patient not depressed No peripheral clubbing Back-normal HEENT-normal/normal eyelids Neck supple/normal carotid upstroke bilaterally; no bruits; no JVD; no thyromegaly chest - CTA/ normal expansion CV - RRR/normal S1 and S2; no murmurs, rubs or gallops;  PMI nondisplaced Abdomen -NT/ND, no HSM, no mass, + bowel sounds, no bruit, previous abdominal surgery 2+ femoral pulses, no bruits Ext-no edema, chords, 2+ DP Neuro-grossly nonfocal  ECG Sinus with no ST changes.   Results for orders placed during the hospital encounter of 02/24/13 (from the past 48 hour(s))  CBC  Status: None   Collection Time    02/24/13 11:35 AM      Result Value Range   WBC 7.8  4.0 - 10.5 K/uL   RBC 4.87  4.22 - 5.81 MIL/uL   Hemoglobin 14.4  13.0 - 17.0 g/dL   HCT 42.3  39.0 - 52.0 %   MCV 86.9  78.0 - 100.0 fL   MCH 29.6  26.0 - 34.0 pg   MCHC 34.0  30.0 - 36.0 g/dL   RDW 14.3  11.5 - 15.5 %   Platelets 222  150 - 400 K/uL  COMPREHENSIVE METABOLIC PANEL     Status: Abnormal   Collection Time    02/24/13 11:35 AM      Result Value Range   Sodium 140  137 - 147 mEq/L   Potassium 4.3  3.7 - 5.3 mEq/L   Chloride 99  96 - 112 mEq/L   CO2 27  19 - 32 mEq/L     Glucose, Bld 133 (*) 70 - 99 mg/dL   BUN 14  6 - 23 mg/dL   Creatinine, Ser 0.90  0.50 - 1.35 mg/dL   Calcium 9.9  8.4 - 10.5 mg/dL   Total Protein 7.5  6.0 - 8.3 g/dL   Albumin 4.0  3.5 - 5.2 g/dL   AST 16  0 - 37 U/L   ALT 23  0 - 53 U/L   Alkaline Phosphatase 101  39 - 117 U/L   Total Bilirubin 0.4  0.3 - 1.2 mg/dL   GFR calc non Af Amer >90  >90 mL/min   GFR calc Af Amer >90  >90 mL/min   Comment: (NOTE)     The eGFR has been calculated using the CKD EPI equation.     This calculation has not been validated in all clinical situations.     eGFR's persistently <90 mL/min signify possible Chronic Kidney     Disease.  PRO B NATRIURETIC PEPTIDE     Status: None   Collection Time    02/24/13 11:35 AM      Result Value Range   Pro B Natriuretic peptide (BNP) 29.1  0 - 125 pg/mL  POCT I-STAT TROPONIN I     Status: None   Collection Time    02/24/13 11:40 AM      Result Value Range   Troponin i, poc 0.00  0.00 - 0.08 ng/mL   Comment 3            Comment: Due to the release kinetics of cTnI,     a negative result within the first hours     of the onset of symptoms does not rule out     myocardial infarction with certainty.     If myocardial infarction is still suspected,     repeat the test at appropriate intervals.  POCT I-STAT TROPONIN I     Status: None   Collection Time    02/24/13  2:44 PM      Result Value Range   Troponin i, poc 0.00  0.00 - 0.08 ng/mL   Comment 3            Comment: Due to the release kinetics of cTnI,     a negative result within the first hours     of the onset of symptoms does not rule out     myocardial infarction with certainty.     If myocardial infarction is still suspected,     repeat  the test at appropriate intervals.    Dg Chest 2 View  02/24/2013   CLINICAL DATA:  Chest pain with history of cardiac stent placement  EXAM: CHEST  2 VIEW  COMPARISON:  March 23, 2009 PA and lateral chest x-ray  FINDINGS: The lungs are borderline  hypoinflated. The interstitial markings are mildly increased but not greatly changed from the previous study. The cardiopericardial silhouette is enlarged though stable. The central pulmonary vascularity is prominent. There is no pleural effusion or pneumothorax. The mediastinum is normal in width. There is tortuosity of the descending thoracic aorta.  IMPRESSION: The findings suggest low-grade CHF with mild pulmonary interstitial edema. There is no alveolar pneumonia.   Electronically Signed   By: David  [place]   On: 02/24/2013 11:16    Assessment/Plan 1 chest pain-symptoms are extremely atypical and sound to be chronic. Initial enzymes are negative. Electrocardiogram shows no ST changes. Would continue to cycle enzymes. If negative he can be discharged home and followup with his cardiologist in one to 2 weeks. Continue preadmission antianginals. It would be helpful to have outside records. 2 coronary artery disease-continue aspirin, Plavix and statin. 3 hypertension-continue preadmission blood pressure medications. 4 hyperlipidemia-continue statin.  Kirk Ruths MD 02/24/2013, 6:25 PM

## 2013-02-24 NOTE — ED Notes (Signed)
Dorris, wife, [phone]

## 2013-02-24 NOTE — ED Notes (Signed)
Pt with hx of 2 stents placed to ED c/o acute onset L sided chest pain.  Took 1 nitro with some relief.  C/o nausea, sob and "flushed face".

## 2013-02-24 NOTE — ED Notes (Addendum)
Received report from [place] [facility], Charity fundraiserN. Admitting MD was paged to determine admission care plan. Patient currently unable to be transferred due to hypotension secondary to Nitroglycerin drip.

## 2013-02-24 NOTE — ED Provider Notes (Signed)
CSN: [id number]     Arrival date & time 02/24/13  1037 History   First MD Initiated Contact with Patient 02/24/13 1110     Chief Complaint  Patient presents with  . Chest Pain   (Consider location/radiation/quality/duration/timing/severity/associated sxs/prior Treatment) Patient is a 58 y.o. male presenting with chest pain. The history is provided by the patient.  Chest Pain Pain location:  L chest Pain quality: sharp   Pain radiates to:  Does not radiate Pain radiates to the back: no   Pain severity:  Moderate Onset quality:  Sudden Duration:  2 hours Timing:  Intermittent Progression:  Unchanged Chronicity:  New Context: at rest   Relieved by:  Nothing Worsened by:  Nothing tried Ineffective treatments:  None tried Associated symptoms: nausea   Associated symptoms: no abdominal pain, no cough, no fever, no headache, no numbness, no shortness of breath and not vomiting     Past Medical History  Diagnosis Date  . Hypertension   . Diabetes mellitus without complication   . Hypercholesteremia    Past Surgical History  Procedure Laterality Date  . Coronary angioplasty with stent placement    . Hernia repair    . Joint replacement Left     knee replacement  . Vetebral fusion     No family history on file. History  Substance Use Topics  . Smoking status: Never Smoker   . Smokeless tobacco: Current User    Types: Chew  . Alcohol Use: No    Review of Systems  Constitutional: Negative for fever.       Feels flushed.   HENT: Negative for drooling and rhinorrhea.   Eyes: Negative for pain.  Respiratory: Negative for cough and shortness of breath.   Cardiovascular: Positive for chest pain and leg swelling (mild swelling several days ago which was relieved w/ diuretic).  Gastrointestinal: Positive for nausea. Negative for vomiting, abdominal pain and diarrhea.  Genitourinary: Negative for dysuria and hematuria.  Musculoskeletal: Negative for gait problem and neck pain.   Skin: Negative for color change.  Neurological: Negative for numbness and headaches.  Hematological: Negative for adenopathy.  Psychiatric/Behavioral: Negative for behavioral problems.  All other systems reviewed and are negative.    Allergies  Codeine  Home Medications  No current outpatient prescriptions on file. BP 132/68  Pulse 60  Temp(Src) 97.6 F (36.4 C) (Oral)  Resp 20  SpO2 94% Physical Exam  Nursing note and vitals reviewed. Constitutional: He is oriented to person, place, and time. He appears well-developed and well-nourished.  HENT:  Head: Normocephalic and atraumatic.  Right Ear: External ear normal.  Left Ear: External ear normal.  Nose: Nose normal.  Mouth/Throat: Oropharynx is clear and moist. No oropharyngeal exudate.  Eyes: Conjunctivae and EOM are normal. Pupils are equal, round, and reactive to light.  Neck: Normal range of motion. Neck supple.  Cardiovascular: Normal rate, regular rhythm, normal heart sounds and intact distal pulses.  Exam reveals no gallop and no friction rub.   No murmur heard. Pulmonary/Chest: Effort normal and breath sounds normal. No respiratory distress. He has no wheezes.  Abdominal: Soft. Bowel sounds are normal. He exhibits no distension. There is no tenderness. There is no rebound and no guarding.  Musculoskeletal: Normal range of motion. He exhibits no edema and no tenderness.  Neurological: He is alert and oriented to person, place, and time.  Skin: Skin is warm and dry.  Psychiatric: He has a normal mood and affect. His behavior is normal.  ED Course  Procedures (including critical care time) Labs Review Labs Reviewed  COMPREHENSIVE METABOLIC PANEL - Abnormal; Notable for the following:    Glucose, Bld 133 (*)    All other components within normal limits  CBC  PRO B NATRIURETIC PEPTIDE  POCT I-STAT TROPONIN I   Imaging Review Dg Chest 2 View  02/24/2013   CLINICAL DATA:  Chest pain with history of cardiac  stent placement  EXAM: CHEST  2 VIEW  COMPARISON:  March 23, 2009 PA and lateral chest x-ray  FINDINGS: The lungs are borderline hypoinflated. The interstitial markings are mildly increased but not greatly changed from the previous study. The cardiopericardial silhouette is enlarged though stable. The central pulmonary vascularity is prominent. There is no pleural effusion or pneumothorax. The mediastinum is normal in width. There is tortuosity of the descending thoracic aorta.  IMPRESSION: The findings suggest low-grade CHF with mild pulmonary interstitial edema. There is no alveolar pneumonia.   Electronically Signed   By: David  Swazil[place]Jordan   On: 02/24/2013 11:16    EKG Interpretation    Date/Time:  Thursday February 24 2013 10:38:43 EST Ventricular Rate:  61 PR Interval:  192 QRS Duration: 88 QT Interval:  424 QTC Calculation: 426 R Axis:   36 Text Interpretation:  Normal sinus rhythm Normal ECG Confirmed by Taina Landry  MD, Daisie Haft ([id number]) on 02/24/2013 11:11:33 AM           CRITICAL CARE Performed by: Purvis SheffieldHARRISON, Ricco Dershem, S Total critical care time: 30 min Critical care time was exclusive of separately billable procedures and treating other patients. Critical care was necessary to treat or prevent imminent or life-threatening deterioration. Critical care was time spent personally by me on the following activities: development of treatment plan with patient and/or surrogate as well as nursing, discussions with consultants, evaluation of patient's response to treatment, examination of patient, obtaining history from patient or surrogate, ordering and performing treatments and interventions, ordering and review of laboratory studies, ordering and review of radiographic studies, pulse oximetry and re-evaluation of patient's condition.  MDM   1. Chest pain    11:20 AM 58 y.o. male w hx of CAD s/p PCI (2 stents) who pw left sided cp which began approximately 1.5 hours ago while he was upstairs in  a waiting room. He is here with a family member who is getting surgery. He states that the pain came on suddenly, he felt nauseous, and also short of breath. He also felt flushed. He states that he took one nitroglycerin which took the edge off his pain. He states that his pain was initially a 7 and was brought down to a 4 by the nitroglycerin. It is currently a 6 on my exam. He has multiple risk factors for cardiac disease. His cardiologist and all of his previous care has been done at hospitals outside the area as he is from out of town. He is afebrile and vital signs are unremarkable on exam. Will get screening labs, aspirin, nitroglycerin for pain.  Pt notes he has been out of his ranexa for 4-5 days.   Will admit to Internal medicine teaching service for cp r/o.   5:31 PM Pt admitted to internal medicine, but sent to Pod C while awaiting a bed. IM residents note the pt continues to have pain which is suspicious for [place]. Residents to arrange stepdown bed. Will heparinize and start IV nitroglycerin. Delta trop neg thus far. I continued to follow the pt and check on him. He remains  stable on my exam.   Critical care documented in this pt w/ chest pain concerning for unstable angina w/ pulmonary edema on CXR and titratable nitro gtt initiated. Pt was sent to step down unit for closer monitoring.   Junius Argyle, MD 02/25/13 1105

## 2013-02-24 NOTE — ED Notes (Signed)
Admitting physician at bedside

## 2013-02-24 NOTE — ED Notes (Signed)
Patient given urinal per request.

## 2013-02-24 NOTE — ED Notes (Signed)
Patient's unit assignment changed, attempted to call report to 3W, but RN asked for a moment to look over patient information. Inpatient RN to call this RN soon.

## 2013-02-24 NOTE — ED Notes (Signed)
Admitting physcian paged.

## 2013-02-25 LAB — CBC
HEMATOCRIT: 41.7 % (ref 39.0–52.0)
Hemoglobin: 13.7 g/dL (ref 13.0–17.0)
MCH: 29.1 pg (ref 26.0–34.0)
MCHC: 32.9 g/dL (ref 30.0–36.0)
MCV: 88.5 fL (ref 78.0–100.0)
PLATELETS: 194 10*3/uL (ref 150–400)
RBC: 4.71 MIL/uL (ref 4.22–5.81)
RDW: 14.3 % (ref 11.5–15.5)
WBC: 5.5 10*3/uL (ref 4.0–10.5)

## 2013-02-25 LAB — HEPARIN LEVEL (UNFRACTIONATED): Heparin Unfractionated: <0.1 IU/mL — ABNORMAL LOW (ref 0.30–0.70)

## 2013-02-25 LAB — GLUCOSE, CAPILLARY
Glucose-Capillary: 99 mg/dL (ref 70–99)
Glucose-Capillary: 139 mg/dL — ABNORMAL HIGH (ref 70–99)

## 2013-02-25 LAB — HEMOGLOBIN A1C
Hgb A1c MFr Bld: 6.7 % — ABNORMAL HIGH (ref <5.7)
MEAN PLASMA GLUCOSE: 146 mg/dL — AB (ref <117)

## 2013-02-25 LAB — TSH: TSH: 1.646 u[IU]/mL (ref 0.350–4.500)

## 2013-02-25 LAB — PROTIME-INR
INR: 0.98 (ref 0.00–1.49)
Prothrombin Time: 12.8 seconds (ref 11.6–15.2)

## 2013-02-25 LAB — TROPONIN I

## 2013-02-25 MED ORDER — TESTOSTERONE 50 MG/5GM (1%) TD GEL: 10.0000 g | Freq: Every day | TRANSDERMAL | Status: DC

## 2013-02-25 MED ORDER — AMLODIPINE BESYLATE 10 MG PO TABS: 20.0000 mg | ORAL_TABLET | Freq: Every day | ORAL | Status: DC

## 2013-02-25 MED ORDER — METOPROLOL TARTRATE 100 MG PO TABS: 100.0000 mg | ORAL_TABLET | Freq: Two times a day (BID) | ORAL | Status: DC

## 2013-02-25 MED ORDER — HEPARIN BOLUS VIA INFUSION
3000.0000 [IU] | Freq: Once | INTRAVENOUS | Status: AC
  Administered 2013-02-25: 3000 [IU] via INTRAVENOUS
  Filled 2013-02-25: qty 3000

## 2013-02-25 MED ORDER — RANOLAZINE ER 1000 MG PO TB12
1000.0000 mg | ORAL_TABLET | Freq: Two times a day (BID) | ORAL | Status: DC
Start: 2013-02-25 — End: 2016-11-24

## 2013-02-25 NOTE — Progress Notes (Signed)
Subjective: Patient's chest pain completely resolved overnight and he had no recurrent episodes. No more SOB or nausea. Patient feels ready to go home. Cardiology evaluated patient yesterday and agreed w/ plan to send patient home with close cardiology follow up as long as troponins negative.  Objective: Vital signs in last 24 hours: Filed Vitals:   02/24/13 1946 02/24/13 2056 02/24/13 2120 02/25/13 0637  BP: 98/46 102/49 125/68 126/50  Pulse: 52 52 58 64  Temp:   97.4 F (36.3 C) 97.4 F (36.3 C)  TempSrc: Oral  Oral Oral  Resp: 16 18 18 18   Height:   6' (1.829 m)   Weight:   320 lb (145.151 kg) 319 lb 10.7 oz (145 kg)  SpO2: 93% 92% 93% 95%   Weight change:   Intake/Output Summary (Last 24 hours) at 02/25/13 0704 Last data filed at 02/24/13 2200  Gross per 24 hour  Intake      0 ml  Output   2100 ml  Net  -2100 ml   Physical Exam  General: alert, cooperative, and in no apparent distress HEENT: pupils equal round and reactive to light, vision grossly intact, oropharynx clear and non-erythematous; MMM  Neck: supple, no JVD Lungs: clear to ascultation bilaterally, normal work of respiration, no wheezes, rales, ronchi Heart: regular rate and rhythm, no murmurs, gallops, or rubs Abdomen: soft,obese, non-tender, non-distended, normal bowel sounds  Extremities: warm extremities, no BLE edema Neurologic: alert & oriented X3, cranial nerves II-XII grossly intact, strength grossly intact, sensation intact to light touch  Lab Results: Basic Metabolic Panel:  Recent Labs Lab 02/24/13 1135  NA 140  K 4.3  CL 99  CO2 27  GLUCOSE 133*  BUN 14  CREATININE 0.90  CALCIUM 9.9   Liver Function Tests:  Recent Labs Lab 02/24/13 1135  AST 16  ALT 23  ALKPHOS 101  BILITOT 0.4  PROT 7.5  ALBUMIN 4.0   CBC:  Recent Labs Lab 02/24/13 1135 02/25/13 0450  WBC 7.8 5.5  HGB 14.4 13.7  HCT 42.3 41.7  MCV 86.9 88.5  PLT 222 194   Cardiac Enzymes:  Recent Labs Lab  02/24/13 2235 02/25/13 0450  TROPONINI <0.30 <0.30   BNP:  Recent Labs Lab 02/24/13 1135  PROBNP 29.1   CBG:  Recent Labs Lab 02/24/13 2136  GLUCAP 172*   Coagulation:  Recent Labs Lab 02/24/13 1901 02/25/13 0450  LABPROT 13.1 12.8  INR 1.01 0.98    Micro Results: No results found for this or any previous visit (from the past 240 hour(s)). Studies/Results: Dg Chest 2 View  02/24/2013   CLINICAL DATA:  Chest pain with history of cardiac stent placement  EXAM: CHEST  2 VIEW  COMPARISON:  March 23, 2009 PA and lateral chest x-ray  FINDINGS: The lungs are borderline hypoinflated. The interstitial markings are mildly increased but not greatly changed from the previous study. The cardiopericardial silhouette is enlarged though stable. The central pulmonary vascularity is prominent. There is no pleural effusion or pneumothorax. The mediastinum is normal in width. There is tortuosity of the descending thoracic aorta.  IMPRESSION: The findings suggest low-grade CHF with mild pulmonary interstitial edema. There is no alveolar pneumonia.   Electronically Signed   By: David  [place]   On: 02/24/2013 11:16   Medications: I have reviewed the patient's current medications. Scheduled Meds: . aspirin EC  81 mg Oral Daily  . atorvastatin  80 mg Oral Daily  . clopidogrel  75 mg Oral Q  breakfast  . DULoxetine  60 mg Oral Daily  . gabapentin  300 mg Oral BID WC  . gabapentin  900 mg Oral QHS  . heparin  3,000 Units Intravenous Once  . isosorbide mononitrate  30 mg Oral Daily  . PARoxetine  20 mg Oral Daily  . ranolazine  1,000 mg Oral BID  . sodium chloride  3 mL Intravenous Q12H  . testosterone  10 g Transdermal Daily   Continuous Infusions: . heparin 1,500 Units/hr (02/24/13 2005)   PRN Meds:.acetaminophen, nitroGLYCERIN, ondansetron (ZOFRAN) IV  Assessment/Plan:  # Atypical chest pain: Patient's pain has resolve and ACS ruled out with negative troponins. NTG gtt was d/c'd early  last night given low BPs. I was able to obtain close cardiology follow up for patient. Patient feels ready to go home. TSH wnl. HbA1c still pending. -heparin gtt stopped  -hold antihypertensives with plan to restart them all tomorrow -continue plavix and ASA  -continue lipitor, IMDUR   # Depression: Stable. Continue duloxetine, paroxetine.   # DM type 2: Unknown A1c. Will hold metformin and glipizide for now and monitor CBGs overnight.  -consider SSI in AM if CBGs uncontrolled  -repeat A1c  -continue gabapentin   # CHF: Per patient, he had an echo this year, but does not know the result. Pt euvolemic on exam, though with mild interstitial edema on CXR. S/p lasix 40mg  IV in ED w/ good diuresis. Unknown weight baseline.  -monitor I/O, daily weights  -continue CHF medications as above   # Diet: HH  Code status:full  Dispo: Discharge today.  The patient does have a current PCP (No Pcp Per Patient) and does not need an [facility] hospital follow-up appointment after discharge.  The patient does not have transportation limitations that hinder transportation to clinic appointments.  .Services Needed at time of discharge: Y = Yes, Blank = No PT:   OT:   RN:   Equipment:   Other:     LOS: 1 day   Windell Hummingbird, MD 02/25/2013, 7:04 AM

## 2013-02-25 NOTE — H&P (Signed)
  Date: 02/25/2013  Patient name: Benjamin Bush  Medical record number: [id number]  Date of birth: [birth year]   I have seen and evaluated Benjamin Bush and discussed their care with the Residency Team.  Briefly, Benjamin Bush is a 58yo man with PMH of HTN, DM2, CHF, CAD who presents with chest pain.  [place] traveling from [place] and has been out of his chronic angina medications for 4 days.  He reports L sharp chest pain, similar to previous episodes, associated with SOB and nausea.  He tried a NTG SL, which only slightly improved the pain, so he presented to the ED.    Assessment and Plan: I have seen and evaluated the patient as outlined above. I agree with the formulated Assessment and Plan as detailed in the residents' admission note, with the following changes:   1. UA vs. NSTEMI: considering time course of the pain, it is most concerning for ACS.  He will be admitted, placed on Heparin gtt, NTG gtt if tolerated, CE cycled, home medications continued and risk stratified as per resident note.  Cardiology is consulted.  Will restart home ranexa.  EKG in the AM.  NPO in case of need for intervention.    2. CHF: EF unknown given his out of system status (from [place]), we will watch I/O's closely and monitor for any signs/symptoms of volume overload.   Other issues per resident note.   Inez CatalinaEmily B Mullen, MD 1/9/201512:03 PM

## 2013-02-25 NOTE — Progress Notes (Addendum)
ANTICOAGULATION CONSULT NOTE - Follow Up Consult  Pharmacy Consult for Heparin  Indication: chest pain/ACS  Allergies  Allergen Reactions  . Codeine Swelling   Patient Measurements: Height: 6' (182.9 cm) Weight: 320 lb (145.151 kg) IBW/kg (Calculated) : 77.6 Heparin Dosing Weight: 112 kg  Vital Signs: Temp: 97.4 F (36.3 C) (01/08 2120) Temp src: Oral (01/08 2120) BP: 125/68 mmHg (01/08 2120) Pulse Rate: 58 (01/08 2120)  Labs:  Recent Labs  02/24/13 1135 02/24/13 1901 02/24/13 2235 02/25/13 0450  HGB 14.4  --   --  13.7  HCT 42.3  --   --  41.7  PLT 222  --   --  194  APTT  --  26  --   --   LABPROT  --  13.1  --  12.8  INR  --  1.01  --  0.98  HEPARINUNFRC  --   --   --  <0.10*  CREATININE 0.90  --   --   --   TROPONINI  --   --  <0.30 <0.30   Estimated Creatinine Clearance: 137.2 ml/min (by C-G formula based on Cr of 0.9).  Medications:  Heparin 1500 units/hr  Assessment: 58 y/o M on heparin for CP, HL undetectable, other labs as above.   Goal of Therapy:  Heparin level 0.3-0.7 units/ml Monitor platelets by anticoagulation protocol: Yes   Plan:  -Heparin 3000 units BOLUS -Increase heparin drip to 1800 units/hr -6 hour HL at 1300 -Daily CBC/HL -Monitor for bleeding  Abran DukeLedford, Hikaru Delorenzo 02/25/2013,5:57 AM  Addendum  Spoke to RN again, apparently IV had in fact infiltrated for unknown amount of time, instructed RN to turn drip back to 1500 units/hr given that we don't know how long the drip was off, keep order for 1300 HL.   Wilmer FloorJLedford, PharmD

## 2013-02-25 NOTE — Progress Notes (Signed)
Patient ID: Benjamin Bush, male   DOB: Mar 17, 1955, 58 y.o.   MRN: [id number]   SUBJECTIVE: No further chest pain.   Marland Kitchen aspirin EC  81 mg Oral Daily  . atorvastatin  80 mg Oral Daily  . clopidogrel  75 mg Oral Q breakfast  . DULoxetine  60 mg Oral Daily  . gabapentin  300 mg Oral BID WC  . gabapentin  900 mg Oral QHS  . isosorbide mononitrate  30 mg Oral Daily  . PARoxetine  20 mg Oral Daily  . ranolazine  1,000 mg Oral BID  . sodium chloride  3 mL Intravenous Q12H  . testosterone  10 g Transdermal Daily     Filed Vitals:   02/24/13 1946 02/24/13 2056 02/24/13 2120 02/25/13 0637  BP: 98/46 102/49 125/68 126/50  Pulse: 52 52 58 64  Temp:   97.4 F (36.3 C) 97.4 F (36.3 C)  TempSrc: Oral  Oral Oral  Resp: 16 18 18 18   Height:   6' (1.829 m)   Weight:   145.151 kg (320 lb) 145 kg (319 lb 10.7 oz)  SpO2: 93% 92% 93% 95%    Intake/Output Summary (Last 24 hours) at 02/25/13 0824 Last data filed at 02/24/13 2200  Gross per 24 hour  Intake      0 ml  Output   2100 ml  Net  -2100 ml    LABS: Basic Metabolic Panel:  Recent Labs  78/29/56 1135  NA 140  K 4.3  CL 99  CO2 27  GLUCOSE 133*  BUN 14  CREATININE 0.90  CALCIUM 9.9   Liver Function Tests:  Recent Labs  02/24/13 1135  AST 16  ALT 23  ALKPHOS 101  BILITOT 0.4  PROT 7.5  ALBUMIN 4.0   No results found for this basename: LIPASE, AMYLASE,  in the last 72 hours CBC:  Recent Labs  02/24/13 1135 02/25/13 0450  WBC 7.8 5.5  HGB 14.4 13.7  HCT 42.3 41.7  MCV 86.9 88.5  PLT 222 194   Cardiac Enzymes:  Recent Labs  02/24/13 2235 02/25/13 0450  TROPONINI <0.30 <0.30   BNP: No components found with this basename: POCBNP,  D-Dimer: No results found for this basename: DDIMER,  in the last 72 hours Hemoglobin A1C: No results found for this basename: HGBA1C,  in the last 72 hours Fasting Lipid Panel: No results found for this basename: CHOL, HDL, LDLCALC, TRIG, CHOLHDL, LDLDIRECT,  in the last  72 hours Thyroid Function Tests: No results found for this basename: TSH, T4TOTAL, FREET3, T3FREE, THYROIDAB,  in the last 72 hours Anemia Panel: No results found for this basename: VITAMINB12, FOLATE, FERRITIN, TIBC, IRON, RETICCTPCT,  in the last 72 hours  RADIOLOGY: Dg Chest 2 View  02/24/2013   CLINICAL DATA:  Chest pain with history of cardiac stent placement  EXAM: CHEST  2 VIEW  COMPARISON:  March 23, 2009 PA and lateral chest x-ray  FINDINGS: The lungs are borderline hypoinflated. The interstitial markings are mildly increased but not greatly changed from the previous study. The cardiopericardial silhouette is enlarged though stable. The central pulmonary vascularity is prominent. There is no pleural effusion or pneumothorax. The mediastinum is normal in width. There is tortuosity of the descending thoracic aorta.  IMPRESSION: The findings suggest low-grade CHF with mild pulmonary interstitial edema. There is no alveolar pneumonia.   Electronically Signed   By: Benjamin  Bush   On: 02/24/2013 11:16    PHYSICAL EXAM General: NAD  Neck: No JVD, no thyromegaly or thyroid nodule.  Lungs: Clear to auscultation bilaterally with normal respiratory effort. CV: Nondisplaced PMI.  Heart regular S1/S2, no S3/S4, no murmur.  No peripheral edema.  No carotid bruit.  Normal pedal pulses.  Abdomen: Soft, nontender, no hepatosplenomegaly, no distention.  Neurologic: Alert and oriented x 3.  Psych: Normal affect. Extremities: No clubbing or cyanosis.   TELEMETRY: Reviewed telemetry pt in NSR  ASSESSMENT AND PLAN: 58 yo with history of CAD admitted with very atypical chest pain.  This morning he is chest pain-free.  His cardiac enzymes are negative and his ECG is unremarkable.  I think that he can be discharged to followup with his cardiologist at home in [place].   Benjamin Bush 02/25/2013

## 2013-02-25 NOTE — Discharge Summary (Signed)
Name: Benjamin Bush MRN: [id number] DOB: February 19, 1955 58 y.o. PCP: No Pcp Per Patient  Date of Admission: 02/24/2013 11:09 AM Date of Discharge: 02/25/2013 Attending Physician: Inez Catalina, MD  Discharge Diagnosis: Principal Problem:   Atypical chest pain w/ hx of angina- resolved Active Problems:   Chest pain  Discharge Medications:   Medication List         amLODipine 10 MG tablet  Commonly known as:  NORVASC  Take 2 tablets (20 mg total) by mouth daily.  Start taking on:  02/26/2013     ANDROGEL PUMP 20.25 MG/ACT (1.62%) Gel  Generic drug:  Testosterone  Place 6 sprays onto the skin daily.     aspirin EC 81 MG tablet  Take 81 mg by mouth daily.     atorvastatin 80 MG tablet  Commonly known as:  LIPITOR  Take 80 mg by mouth daily.     clopidogrel 75 MG tablet  Commonly known as:  PLAVIX  Take 75 mg by mouth daily with breakfast.     diclofenac sodium 1 % Gel  Commonly known as:  VOLTAREN  Apply 4 g topically 4 (four) times daily as needed.     DULoxetine 60 MG capsule  Commonly known as:  CYMBALTA  Take 60 mg by mouth daily.     furosemide 20 MG tablet  Commonly known as:  LASIX  Take 20 mg by mouth daily as needed. edema     gabapentin 300 MG capsule  Commonly known as:  NEURONTIN  Take 300-900 mg by mouth 3 (three) times daily. 1 capsule at am and lunch and 3 capsules at bedtime.     glipiZIDE 10 MG 24 hr tablet  Commonly known as:  GLUCOTROL XL  Take 10 mg by mouth daily with breakfast.     isosorbide mononitrate 30 MG 24 hr tablet  Commonly known as:  IMDUR  Take 30 mg by mouth daily.     metFORMIN 500 MG tablet  Commonly known as:  GLUCOPHAGE  Take 500 mg by mouth 2 (two) times daily with a meal.     metoprolol 100 MG tablet  Commonly known as:  LOPRESSOR  Take 1 tablet (100 mg total) by mouth 2 (two) times daily.  Start taking on:  02/26/2013     PARoxetine 20 MG tablet  Commonly known as:  PAXIL  Take 20 mg by mouth daily.     potassium chloride SA 20 MEQ tablet  Commonly known as:  K-DUR,KLOR-CON  Take 20 mEq by mouth daily.     ranolazine 1000 MG SR tablet  Commonly known as:  RANEXA  Take 1 tablet (1,000 mg total) by mouth 2 (two) times daily.        Disposition and follow-up:   Mr.Benjamin Bush was discharged from [facility] in Stable condition.  At the hospital follow up visit please address:  1.  Make sure patient gets ranexa filled and assess for recurrent episodes of chest pain.  2.  Labs / imaging needed at time of follow-up: none 3.  Pending labs/ test needing follow-up: HbA1c pending at discharge  Follow-up Appointments:     Follow-up Information   Follow up with Hal Morales, PA-C On 03/01/2013. (1pm)    Contact information:   [address], [address] [place], [place] [address] Phone: [phone] Fax: [phone]       Follow up with Bridgett Larsson, MD On 03/09/2013. (10:30 am)    Specialty:  Family Medicine   Contact information:   [address] [place] [address] [phone]       Discharge Instructions: Discharge Orders   Future Orders Complete By Expires   Call MD for:  difficulty breathing, headache or visual disturbances  As directed    Call MD for:  persistant dizziness or light-headedness  As directed    Call MD for:  severe uncontrolled pain  As directed    Diet - low sodium heart healthy  As directed    Increase activity slowly  As directed       Consultations:   Lewayne BuntingBrian S Crenshaw, MD- cardiology  Procedures Performed:  Dg Chest 2 View  02/24/2013   CLINICAL DATA:  Chest pain with history of cardiac stent placement  EXAM: CHEST  2 VIEW  COMPARISON:  March 23, 2009 PA and lateral chest x-ray  FINDINGS: The lungs are borderline hypoinflated. The interstitial markings are mildly increased but not greatly changed from the previous study. The cardiopericardial silhouette is enlarged though stable. The central pulmonary vascularity is  prominent. There is no pleural effusion or pneumothorax. The mediastinum is normal in width. There is tortuosity of the descending thoracic aorta.  IMPRESSION: The findings suggest low-grade CHF with mild pulmonary interstitial edema. There is no alveolar pneumonia.   Electronically Signed   By: David  Swazil[place]   On: 02/24/2013 11:16   Admission HPI:  This is a 57yo WM w/ PMH HTN, DMT2, HLD, CHF (unknown echo results), known CAD w/ 2 stents (2014) who presents with chest pain.  Patient [place] from [place] to see his wife's cousin who is currently hospitalized. He has been in his usual sate of health until this morning when he was sitting in the waiting room when he had sudden onset of L sharp chest pain w/ associated SOB and nausea, no diaphoresis. Pt took 1 NTG tablet which improved the pain, but did not relieve it completely so he went to the ED. In the ED patient was given another NTG tablet which did not help. Patient got some morphine IV which brought the pain down to a 2/10. The pain has gradually worsened again and is now a 4/10. He has some residual nausea, no vomiting. Pain has been constant since onset and does not radiate anywhere. He has some chronic lower back pain, no changes. Patient has intermittent BLE swelling and he notes his legs were swollen a few days ago, but this resolved after taking lasix 20mg  (which he takes prn at home). Patient is compliant with all medications and took all medications this morning, however he has been out of his Ranexa x 4 days. Denies URI symptoms.  In the ED, afebrile, BP 129/67, HR 59, RR 20, satting 94% on 2LPM. BMP and CBC wnl. Trop neg x 1. BNP wnl. CXR showed mild interstitial edema. Patient received lasix 40mg  IV, ASA 325mg , morphine, dilauded, reglan, and phenergan.  Of note, patient tells us he is in the process of getting home O2 set up, but denies h/o lung diseases and is not sure why he requires supplemental oxygen.   Hospital Course by  problem list: # Atypical chest pain: Patient presented with sharp L sided chest pain that was atypical in nature for ACS. However, patient had some symptoms of ACS including nausea as well as associated SOB. Given his risk factors, we decided to rule out ACS and monitor on telemetry overnight. Trops were negative x 3. EKG without ischemic  change. No events on telemetry. We treated initially with NTG and heparin gtt, however NTG gtt was d/c'd after only a few hours given low BPs and improvement of chest pain. Pt's pain resolved overnight. Heparin gtt was discontinued once all troponins were negative. I was able to obtain close cardiology follow up for patient with his cardiologist in [place]. On morning of discharge, patient had no chest pain and patient felt ready to go home. TSH wnl. HbA1c still pending. Given soft blood pressure, we held all home antihypertensives, plan to restart these medications on day following discharge. We continued his home plavix, ASA, IMDUR, lipitor. I gave pt another rx for ranexa to go home with.  # Depression: Stable. Continued duloxetine, paroxetine.   # DM type 2: Unknown A1c. Held metformin and glipizide overnight. Restarted these medications on discharge. Checked HbA1c, still pending. Continued gabapentin.  # CHF: Per patient, he had an echo this year, but does not know the result. Pt euvolemic on exam, though with mild interstitial edema on CXR. S/p lasix 40mg  IV in ED w/ good diuresis. Unknown weight baseline. Continued home IMDUR, though held antihypertensives. All medications were restarted upon discharge.  Discharge Vitals:   BP 126/50  Pulse 64  Temp(Src) 97.4 F (36.3 C) (Oral)  Resp 18  Ht 6' (1.829 m)  Wt 319 lb 10.7 oz (145 kg)  BMI 43.35 kg/m2  SpO2 95%  Discharge Labs:  Results for orders placed during the hospital encounter of 02/24/13 (from the past 24 hour(s))  POCT I-STAT TROPONIN I     Status: None   Collection Time    02/24/13  2:44 PM        Result Value Range   Troponin i, poc 0.00  0.00 - 0.08 ng/mL   Comment 3           APTT     Status: None   Collection Time    02/24/13  7:01 PM      Result Value Range   aPTT 26  24 - 37 seconds  PROTIME-INR     Status: None   Collection Time    02/24/13  7:01 PM      Result Value Range   Prothrombin Time 13.1  11.6 - 15.2 seconds   INR 1.01  0.00 - 1.49  GLUCOSE, CAPILLARY     Status: Abnormal   Collection Time    02/24/13  9:36 PM      Result Value Range   Glucose-Capillary 172 (*) 70 - 99 mg/dL   Comment 1 Notify RN    TROPONIN I     Status: None   Collection Time    02/24/13 10:35 PM      Result Value Range   Troponin I <0.30  <0.30 ng/mL  TSH     Status: None   Collection Time    02/24/13 10:35 PM      Result Value Range   TSH 1.646  0.350 - 4.500 uIU/mL  CBC     Status: None   Collection Time    02/25/13  4:50 AM      Result Value Range   WBC 5.5  4.0 - 10.5 K/uL   RBC 4.71  4.22 - 5.81 MIL/uL   Hemoglobin 13.7  13.0 - 17.0 g/dL   HCT 16.1  09.6 - 04.5 %   MCV 88.5  78.0 - 100.0 fL   MCH 29.1  26.0 - 34.0 pg   MCHC 32.9  30.0 -  36.0 g/dL   RDW 16.1  09.6 - 04.5 %   Platelets 194  150 - 400 K/uL  HEPARIN LEVEL (UNFRACTIONATED)     Status: Abnormal   Collection Time    02/25/13  4:50 AM      Result Value Range   Heparin Unfractionated <0.10 (*) 0.30 - 0.70 IU/mL  TROPONIN I     Status: None   Collection Time    02/25/13  4:50 AM      Result Value Range   Troponin I <0.30  <0.30 ng/mL  PROTIME-INR     Status: None   Collection Time    02/25/13  4:50 AM      Result Value Range   Prothrombin Time 12.8  11.6 - 15.2 seconds   INR 0.98  0.00 - 1.49  GLUCOSE, CAPILLARY     Status: None   Collection Time    02/25/13  7:30 AM      Result Value Range   Glucose-Capillary 99  70 - 99 mg/dL  GLUCOSE, CAPILLARY     Status: Abnormal   Collection Time    02/25/13 11:34 AM      Result Value Range   Glucose-Capillary 139 (*) 70 - 99 mg/dL     Signed: Windell Hummingbird, MD 02/25/2013, 11:54 AM   Time Spent on Discharge: 35 minutes Services Ordered on Discharge: none Equipment Ordered on Discharge: none

## 2013-02-25 NOTE — Discharge Instructions (Signed)
Please restart taking you home blood pressure medications starting tomorrow (02/26/2013).  Please refill your ranexa as I refilled this medication for you.  Angina Pectoris Angina pectoris is extreme discomfort in your chest, neck, or arm. Your doctor may call it just angina. It is caused by a lack of oxygen to your heart wall. It may feel like tightness or heavy pressure. It may feel like a crushing or squeezing pain. Some people say it feels like gas. It may go down your shoulders, back, and arms. Some people have symptoms other than pain. These include:  Tiredness.  Shortness of breath.  Cold sweats.  Feeling sick to your stomach (nausea). There are four types of angina:  Stable angina often lasts the same amount of time each time it happens. Activity, stress, or excitement can bring it on. It often gets better after taking a special medicine called nitroglycerin. This goes under your tongue.  Unstable angina can happen when you are not active or even during sleep. It can suddenly get worse or happen more often. It may not get better after taking the special medicine. It can last up to 30 minutes.  Microvascular angina is more common in women. It may be more severe or last longer than other types.  Prinzmetal angina often happens when you are not active or in the early morning hours. HOME CARE   Only take medicines as told by your doctor.  Stay active or exercise more as told by your doctor.  Limit very hard activity as told by your doctor.  Limit heavy lifting as told by your doctor.  Keep a healthy weight.  Learn about and eat foods that are healthy for your heart.  Do not smoke. GET HELP RIGHT AWAY IF:   You have chest, neck, deep shoulder, or arm pain or discomfort that lasts more than a few minutes.  You have chest, neck, deep shoulder, or arm pain or discomfort that goes away and comes back over and over again.  You have heavy sweating that seems to happen for no  reason.  You have shortness of breath or trouble breathing.  Your angina does not get better after a few minutes of rest.  Your angina does not get better after you take nitroglycerin medicine. These can all be symptoms of a heart attack. Get help right away. Call your local emergency service (911 in [place] Do not  drive yourself to the hospital. Do not  wait to for your symptoms to go away. MAKE SURE YOU:   Understand these instructions.  Will watch your condition.  Will get help right away if you are not doing well or get worse. Document Released: 07/23/2007 Document Revised: 01/21/2012 Document Reviewed: 11/13/2011 [facility] Patient Information 2014 [place] [place].  Chest Pain (Nonspecific) It is often hard to give a specific diagnosis for the cause of chest pain. There is always a chance that your pain could be related to something serious, such as a heart attack or a blood clot in the lungs. You need to follow up with your caregiver for further evaluation. CAUSES   Heartburn.  Pneumonia or bronchitis.  Anxiety or stress.  Inflammation around your heart (pericarditis) or lung (pleuritis or pleurisy).  A blood clot in the lung.  A collapsed lung (pneumothorax). It can develop suddenly on its own (spontaneous pneumothorax) or from injury (trauma) to the chest.  Shingles infection (herpes zoster virus). The chest wall is composed of bones, muscles, and cartilage. Any of these can  be the source of the pain.  The bones can be bruised by injury.  The muscles or cartilage can be strained by coughing or overwork.  The cartilage can be affected by inflammation and become sore (costochondritis). DIAGNOSIS  Lab tests or other studies, such as X-rays, electrocardiography, stress testing, or cardiac imaging, may be needed to find the cause of your pain.  TREATMENT   Treatment depends on what may be causing your chest pain. Treatment may include:  Acid blockers for  heartburn.  Anti-inflammatory medicine.  Pain medicine for inflammatory conditions.  Antibiotics if an infection is present.  You may be advised to change lifestyle habits. This includes stopping smoking and avoiding alcohol, caffeine, and chocolate.  You may be advised to keep your head raised (elevated) when sleeping. This reduces the chance of acid going backward from your stomach into your esophagus.  Most of the time, nonspecific chest pain will improve within 2 to 3 days with rest and mild pain medicine. HOME CARE INSTRUCTIONS   If antibiotics were prescribed, take your antibiotics as directed. Finish them even if you start to feel better.  For the next few days, avoid physical activities that bring on chest pain. Continue physical activities as directed.  Do not smoke.  Avoid drinking alcohol.  Only take over-the-counter or prescription medicine for pain, discomfort, or fever as directed by your caregiver.  Follow your caregiver's suggestions for further testing if your chest pain does not go away.  Keep any follow-up appointments you made. If you do not go to an appointment, you could develop lasting (chronic) problems with pain. If there is any problem keeping an appointment, you must call to reschedule. SEEK MEDICAL CARE IF:   You think you are having problems from the medicine you are taking. Read your medicine instructions carefully.  Your chest pain does not go away, even after treatment.  You develop a rash with blisters on your chest. SEEK IMMEDIATE MEDICAL CARE IF:   You have increased chest pain or pain that spreads to your arm, neck, jaw, back, or abdomen.  You develop shortness of breath, an increasing cough, or you are coughing up blood.  You have severe back or abdominal pain, feel nauseous, or vomit.  You develop severe weakness, fainting, or chills.  You have a fever. THIS IS AN EMERGENCY. Do not wait to see if the pain will go away. Get medical  help at once. Call your local emergency services (911 in [place] Do not drive yourself to the hospital. MAKE SURE YOU:   Understand these instructions.  Will watch your condition.  Will get help right away if you are not doing well or get worse. Document Released: 11/13/2004 Document Revised: 04/28/2011 Document Reviewed: 09/09/2007 [facility] Patient Information 2014 [place], [place].

## 2013-02-27 NOTE — Discharge Summary (Signed)
I saw [name] on day of discharge and assisted with the discharge planning.

## 2013-04-08 DIAGNOSIS — Q211 Atrial septal defect, unspecified: Secondary | ICD-10-CM

## 2013-04-08 HISTORY — DX: Atrial septal defect, unspecified: Q21.10

## 2013-05-13 DIAGNOSIS — Q211 Atrial septal defect: Secondary | ICD-10-CM | POA: Insufficient documentation

## 2013-05-13 DIAGNOSIS — J449 Chronic obstructive pulmonary disease, unspecified: Secondary | ICD-10-CM

## 2013-05-13 DIAGNOSIS — Q2111 Secundum atrial septal defect: Secondary | ICD-10-CM | POA: Insufficient documentation

## 2013-05-13 HISTORY — DX: Chronic obstructive pulmonary disease, unspecified: J44.9

## 2014-11-12 DIAGNOSIS — I5032 Chronic diastolic (congestive) heart failure: Secondary | ICD-10-CM | POA: Insufficient documentation

## 2014-11-12 DIAGNOSIS — E1169 Type 2 diabetes mellitus with other specified complication: Secondary | ICD-10-CM | POA: Insufficient documentation

## 2014-11-12 DIAGNOSIS — E785 Hyperlipidemia, unspecified: Secondary | ICD-10-CM | POA: Insufficient documentation

## 2014-11-12 HISTORY — DX: Type 2 diabetes mellitus with other specified complication: E11.69

## 2014-12-13 DIAGNOSIS — Z01810 Encounter for preprocedural cardiovascular examination: Secondary | ICD-10-CM | POA: Insufficient documentation

## 2015-06-08 DIAGNOSIS — Z6841 Body Mass Index (BMI) 40.0 and over, adult: Secondary | ICD-10-CM | POA: Insufficient documentation

## 2016-02-18 DIAGNOSIS — F3341 Major depressive disorder, recurrent, in partial remission: Secondary | ICD-10-CM | POA: Insufficient documentation

## 2016-02-18 DIAGNOSIS — F32A Depression, unspecified: Secondary | ICD-10-CM | POA: Insufficient documentation

## 2016-04-10 DIAGNOSIS — E1122 Type 2 diabetes mellitus with diabetic chronic kidney disease: Secondary | ICD-10-CM | POA: Insufficient documentation

## 2016-04-10 HISTORY — DX: Type 2 diabetes mellitus with diabetic chronic kidney disease: E11.22

## 2016-11-19 DIAGNOSIS — I509 Heart failure, unspecified: Secondary | ICD-10-CM | POA: Diagnosis not present

## 2016-11-19 DIAGNOSIS — E785 Hyperlipidemia, unspecified: Secondary | ICD-10-CM | POA: Diagnosis not present

## 2016-11-19 DIAGNOSIS — E662 Morbid (severe) obesity with alveolar hypoventilation: Secondary | ICD-10-CM | POA: Diagnosis not present

## 2016-11-19 DIAGNOSIS — I2510 Atherosclerotic heart disease of native coronary artery without angina pectoris: Secondary | ICD-10-CM

## 2016-11-19 DIAGNOSIS — G4733 Obstructive sleep apnea (adult) (pediatric): Secondary | ICD-10-CM

## 2016-11-19 DIAGNOSIS — J9621 Acute and chronic respiratory failure with hypoxia: Secondary | ICD-10-CM

## 2016-11-19 DIAGNOSIS — F329 Major depressive disorder, single episode, unspecified: Secondary | ICD-10-CM | POA: Diagnosis not present

## 2016-11-19 DIAGNOSIS — G894 Chronic pain syndrome: Secondary | ICD-10-CM | POA: Diagnosis not present

## 2016-11-19 DIAGNOSIS — J9622 Acute and chronic respiratory failure with hypercapnia: Secondary | ICD-10-CM | POA: Diagnosis not present

## 2016-11-19 DIAGNOSIS — I10 Essential (primary) hypertension: Secondary | ICD-10-CM | POA: Diagnosis not present

## 2016-11-19 DIAGNOSIS — E1169 Type 2 diabetes mellitus with other specified complication: Secondary | ICD-10-CM | POA: Diagnosis not present

## 2016-11-20 DIAGNOSIS — E785 Hyperlipidemia, unspecified: Secondary | ICD-10-CM | POA: Diagnosis not present

## 2016-11-20 DIAGNOSIS — I509 Heart failure, unspecified: Secondary | ICD-10-CM | POA: Diagnosis not present

## 2016-11-20 DIAGNOSIS — G4733 Obstructive sleep apnea (adult) (pediatric): Secondary | ICD-10-CM | POA: Diagnosis not present

## 2016-11-20 DIAGNOSIS — I2510 Atherosclerotic heart disease of native coronary artery without angina pectoris: Secondary | ICD-10-CM | POA: Diagnosis not present

## 2016-11-20 DIAGNOSIS — J9621 Acute and chronic respiratory failure with hypoxia: Secondary | ICD-10-CM | POA: Diagnosis not present

## 2016-11-20 DIAGNOSIS — E662 Morbid (severe) obesity with alveolar hypoventilation: Secondary | ICD-10-CM | POA: Diagnosis not present

## 2016-11-20 DIAGNOSIS — I5032 Chronic diastolic (congestive) heart failure: Secondary | ICD-10-CM | POA: Diagnosis not present

## 2016-11-20 DIAGNOSIS — E1169 Type 2 diabetes mellitus with other specified complication: Secondary | ICD-10-CM | POA: Diagnosis not present

## 2016-11-20 DIAGNOSIS — I10 Essential (primary) hypertension: Secondary | ICD-10-CM | POA: Diagnosis not present

## 2016-11-20 DIAGNOSIS — J9622 Acute and chronic respiratory failure with hypercapnia: Secondary | ICD-10-CM | POA: Diagnosis not present

## 2016-11-21 ENCOUNTER — Inpatient Hospital Stay (HOSPITAL_COMMUNITY)
Admission: AD | Admit: 2016-11-21 | Discharge: 2016-11-24 | DRG: 291 | Disposition: A | Discharge status: Home / Self Care | Payer: Medicare PPO | Source: Other Acute Inpatient Hospital | Attending: Internal Medicine | Admitting: Internal Medicine

## 2016-11-21 DIAGNOSIS — N179 Acute kidney failure, unspecified: Secondary | ICD-10-CM | POA: Diagnosis present

## 2016-11-21 DIAGNOSIS — E1169 Type 2 diabetes mellitus with other specified complication: Secondary | ICD-10-CM

## 2016-11-21 DIAGNOSIS — J9602 Acute respiratory failure with hypercapnia: Secondary | ICD-10-CM | POA: Diagnosis present

## 2016-11-21 DIAGNOSIS — I2699 Other pulmonary embolism without acute cor pulmonale: Secondary | ICD-10-CM | POA: Diagnosis not present

## 2016-11-21 DIAGNOSIS — J9601 Acute respiratory failure with hypoxia: Secondary | ICD-10-CM | POA: Diagnosis present

## 2016-11-21 DIAGNOSIS — R338 Other retention of urine: Secondary | ICD-10-CM | POA: Diagnosis not present

## 2016-11-21 DIAGNOSIS — E662 Morbid (severe) obesity with alveolar hypoventilation: Secondary | ICD-10-CM | POA: Diagnosis present

## 2016-11-21 DIAGNOSIS — Z86711 Personal history of pulmonary embolism: Secondary | ICD-10-CM | POA: Diagnosis not present

## 2016-11-21 DIAGNOSIS — Z955 Presence of coronary angioplasty implant and graft: Secondary | ICD-10-CM

## 2016-11-21 DIAGNOSIS — E119 Type 2 diabetes mellitus without complications: Secondary | ICD-10-CM

## 2016-11-21 DIAGNOSIS — R079 Chest pain, unspecified: Secondary | ICD-10-CM | POA: Diagnosis present

## 2016-11-21 DIAGNOSIS — I5032 Chronic diastolic (congestive) heart failure: Secondary | ICD-10-CM | POA: Diagnosis present

## 2016-11-21 DIAGNOSIS — Z22322 Carrier or suspected carrier of Methicillin resistant Staphylococcus aureus: Secondary | ICD-10-CM | POA: Diagnosis not present

## 2016-11-21 DIAGNOSIS — Z9981 Dependence on supplemental oxygen: Secondary | ICD-10-CM | POA: Diagnosis not present

## 2016-11-21 DIAGNOSIS — E7800 Pure hypercholesterolemia, unspecified: Secondary | ICD-10-CM | POA: Diagnosis present

## 2016-11-21 DIAGNOSIS — Z7902 Long term (current) use of antithrombotics/antiplatelets: Secondary | ICD-10-CM | POA: Diagnosis not present

## 2016-11-21 DIAGNOSIS — J9600 Acute respiratory failure, unspecified whether with hypoxia or hypercapnia: Secondary | ICD-10-CM | POA: Diagnosis not present

## 2016-11-21 DIAGNOSIS — I5033 Acute on chronic diastolic (congestive) heart failure: Secondary | ICD-10-CM | POA: Diagnosis present

## 2016-11-21 DIAGNOSIS — N138 Other obstructive and reflux uropathy: Secondary | ICD-10-CM | POA: Diagnosis present

## 2016-11-21 DIAGNOSIS — Z6841 Body Mass Index (BMI) 40.0 and over, adult: Secondary | ICD-10-CM

## 2016-11-21 DIAGNOSIS — N401 Enlarged prostate with lower urinary tract symptoms: Secondary | ICD-10-CM | POA: Diagnosis present

## 2016-11-21 DIAGNOSIS — Z87891 Personal history of nicotine dependence: Secondary | ICD-10-CM | POA: Diagnosis not present

## 2016-11-21 DIAGNOSIS — F329 Major depressive disorder, single episode, unspecified: Secondary | ICD-10-CM | POA: Diagnosis present

## 2016-11-21 DIAGNOSIS — Z8774 Personal history of (corrected) congenital malformations of heart and circulatory system: Secondary | ICD-10-CM

## 2016-11-21 DIAGNOSIS — Z8711 Personal history of peptic ulcer disease: Secondary | ICD-10-CM | POA: Diagnosis not present

## 2016-11-21 DIAGNOSIS — I110 Hypertensive heart disease with heart failure: Principal | ICD-10-CM | POA: Diagnosis present

## 2016-11-21 DIAGNOSIS — G4733 Obstructive sleep apnea (adult) (pediatric): Secondary | ICD-10-CM | POA: Diagnosis not present

## 2016-11-21 DIAGNOSIS — I259 Chronic ischemic heart disease, unspecified: Secondary | ICD-10-CM | POA: Diagnosis not present

## 2016-11-21 DIAGNOSIS — Z7982 Long term (current) use of aspirin: Secondary | ICD-10-CM

## 2016-11-21 DIAGNOSIS — E872 Acidosis: Secondary | ICD-10-CM | POA: Diagnosis present

## 2016-11-21 DIAGNOSIS — I361 Nonrheumatic tricuspid (valve) insufficiency: Secondary | ICD-10-CM | POA: Diagnosis not present

## 2016-11-21 DIAGNOSIS — Z7984 Long term (current) use of oral hypoglycemic drugs: Secondary | ICD-10-CM | POA: Diagnosis not present

## 2016-11-21 DIAGNOSIS — R0781 Pleurodynia: Secondary | ICD-10-CM | POA: Diagnosis not present

## 2016-11-21 DIAGNOSIS — Z9289 Personal history of other medical treatment: Secondary | ICD-10-CM

## 2016-11-21 DIAGNOSIS — Z9119 Patient's noncompliance with other medical treatment and regimen: Secondary | ICD-10-CM

## 2016-11-21 DIAGNOSIS — Z66 Do not resuscitate: Secondary | ICD-10-CM | POA: Diagnosis present

## 2016-11-21 DIAGNOSIS — I25119 Atherosclerotic heart disease of native coronary artery with unspecified angina pectoris: Secondary | ICD-10-CM | POA: Diagnosis not present

## 2016-11-21 DIAGNOSIS — R0902 Hypoxemia: Secondary | ICD-10-CM | POA: Diagnosis not present

## 2016-11-21 DIAGNOSIS — I2510 Atherosclerotic heart disease of native coronary artery without angina pectoris: Secondary | ICD-10-CM | POA: Diagnosis present

## 2016-11-21 HISTORY — DX: Depression, unspecified: F32.A

## 2016-11-21 HISTORY — DX: Major depressive disorder, single episode, unspecified: F32.9

## 2016-11-21 HISTORY — DX: Headache, unspecified: R51.9

## 2016-11-21 HISTORY — DX: Unspecified diastolic (congestive) heart failure: I50.30

## 2016-11-21 HISTORY — DX: Headache: R51

## 2016-11-21 HISTORY — DX: Obstructive sleep apnea (adult) (pediatric): G47.33

## 2016-11-21 HISTORY — DX: Morbid (severe) obesity due to excess calories: E66.01

## 2016-11-21 HISTORY — DX: Patent foramen ovale: Q21.12

## 2016-11-21 HISTORY — DX: Atrial septal defect: Q21.1

## 2016-11-21 LAB — GLUCOSE, CAPILLARY: Glucose-Capillary: 163 mg/dL — ABNORMAL HIGH (ref 65–99)

## 2016-11-22 ENCOUNTER — Encounter (HOSPITAL_COMMUNITY): Payer: Self-pay | Admitting: *Deleted

## 2016-11-22 ENCOUNTER — Inpatient Hospital Stay (HOSPITAL_COMMUNITY): Payer: Medicare PPO

## 2016-11-22 DIAGNOSIS — I25119 Atherosclerotic heart disease of native coronary artery with unspecified angina pectoris: Secondary | ICD-10-CM

## 2016-11-22 DIAGNOSIS — I5032 Chronic diastolic (congestive) heart failure: Secondary | ICD-10-CM

## 2016-11-22 DIAGNOSIS — R338 Other retention of urine: Secondary | ICD-10-CM

## 2016-11-22 DIAGNOSIS — Z86711 Personal history of pulmonary embolism: Secondary | ICD-10-CM | POA: Diagnosis not present

## 2016-11-22 DIAGNOSIS — R0902 Hypoxemia: Secondary | ICD-10-CM | POA: Diagnosis not present

## 2016-11-22 DIAGNOSIS — G4733 Obstructive sleep apnea (adult) (pediatric): Secondary | ICD-10-CM | POA: Insufficient documentation

## 2016-11-22 DIAGNOSIS — J9602 Acute respiratory failure with hypercapnia: Secondary | ICD-10-CM | POA: Diagnosis present

## 2016-11-22 DIAGNOSIS — I2510 Atherosclerotic heart disease of native coronary artery without angina pectoris: Secondary | ICD-10-CM | POA: Diagnosis present

## 2016-11-22 DIAGNOSIS — N179 Acute kidney failure, unspecified: Secondary | ICD-10-CM

## 2016-11-22 DIAGNOSIS — I259 Chronic ischemic heart disease, unspecified: Secondary | ICD-10-CM

## 2016-11-22 DIAGNOSIS — E1169 Type 2 diabetes mellitus with other specified complication: Secondary | ICD-10-CM

## 2016-11-22 DIAGNOSIS — R0781 Pleurodynia: Secondary | ICD-10-CM

## 2016-11-22 DIAGNOSIS — E662 Morbid (severe) obesity with alveolar hypoventilation: Secondary | ICD-10-CM | POA: Diagnosis not present

## 2016-11-22 DIAGNOSIS — I361 Nonrheumatic tricuspid (valve) insufficiency: Secondary | ICD-10-CM

## 2016-11-22 DIAGNOSIS — J9600 Acute respiratory failure, unspecified whether with hypoxia or hypercapnia: Secondary | ICD-10-CM

## 2016-11-22 DIAGNOSIS — E119 Type 2 diabetes mellitus without complications: Secondary | ICD-10-CM

## 2016-11-22 DIAGNOSIS — I2699 Other pulmonary embolism without acute cor pulmonale: Secondary | ICD-10-CM | POA: Diagnosis not present

## 2016-11-22 HISTORY — DX: Personal history of pulmonary embolism: Z86.711

## 2016-11-22 HISTORY — DX: Chronic diastolic (congestive) heart failure: I50.32

## 2016-11-22 LAB — ECHOCARDIOGRAM COMPLETE
AVLVOTPG: 7 mmHg
CHL CUP MV DEC (S): 208
E/e' ratio: 10.63
EWDT: 208 ms
FS: 25 % — AB (ref 28–44)
Height: 72 in
IV/PV OW: 1.05
LA diam end sys: 54 mm
LA diam index: 2.05 cm/m2
LASIZE: 54 mm
LAVOL: 51.6 mL
LAVOLA4C: 47 mL
LAVOLIN: 19.5 mL/m2
LDCA: 3.14 cm2
LV E/e'average: 10.63
LV PW d: 12.4 mm — AB (ref 0.6–1.1)
LV TDI E'LATERAL: 11.1
LV TDI E'MEDIAL: 6.64
LV e' LATERAL: 11.1 cm/s
LVEEMED: 10.63
LVOT SV: 72 mL
LVOT VTI: 22.9 cm
LVOTD: 20 mm
LVOTPV: 130 cm/s
Lateral S' vel: 18.6 cm/s
MV Peak grad: 6 mmHg
MV pk E vel: 118 m/s
MVPKAVEL: 83.4 m/s
RV TAPSE: 25.6 mm
Weight: 5305.15 oz

## 2016-11-22 LAB — CBC
HEMATOCRIT: 36.4 % — AB (ref 39.0–52.0)
HEMOGLOBIN: 11.5 g/dL — AB (ref 13.0–17.0)
MCH: 26.5 pg (ref 26.0–34.0)
MCHC: 31.6 g/dL (ref 30.0–36.0)
MCV: 83.9 fL (ref 78.0–100.0)
Platelets: 202 10*3/uL (ref 150–400)
RBC: 4.34 MIL/uL (ref 4.22–5.81)
RDW: 16 % — ABNORMAL HIGH (ref 11.5–15.5)
WBC: 18 10*3/uL — AB (ref 4.0–10.5)

## 2016-11-22 LAB — GLUCOSE, CAPILLARY
GLUCOSE-CAPILLARY: 152 mg/dL — AB (ref 65–99)
GLUCOSE-CAPILLARY: 182 mg/dL — AB (ref 65–99)
GLUCOSE-CAPILLARY: 165 mg/dL — AB (ref 65–99)
Glucose-Capillary: 258 mg/dL — ABNORMAL HIGH (ref 65–99)

## 2016-11-22 LAB — COMPREHENSIVE METABOLIC PANEL
ALBUMIN: 3.6 g/dL (ref 3.5–5.0)
ALK PHOS: 50 U/L (ref 38–126)
ALT: 17 U/L (ref 17–63)
AST: 20 U/L (ref 15–41)
Anion gap: 15 (ref 5–15)
BILIRUBIN TOTAL: 0.6 mg/dL (ref 0.3–1.2)
BUN: 84 mg/dL — AB (ref 6–20)
CALCIUM: 7.6 mg/dL — AB (ref 8.9–10.3)
CO2: 29 mmol/L (ref 22–32)
Chloride: 91 mmol/L — ABNORMAL LOW (ref 101–111)
Creatinine, Ser: 4 mg/dL — ABNORMAL HIGH (ref 0.61–1.24)
GFR calc Af Amer: 17 mL/min — ABNORMAL LOW (ref >60)
GFR calc non Af Amer: 15 mL/min — ABNORMAL LOW (ref >60)
GLUCOSE: 171 mg/dL — AB (ref 65–99)
Potassium: 4.7 mmol/L (ref 3.5–5.1)
SODIUM: 135 mmol/L (ref 135–145)
TOTAL PROTEIN: 6.8 g/dL (ref 6.5–8.1)

## 2016-11-22 LAB — URINALYSIS, ROUTINE W REFLEX MICROSCOPIC
BILIRUBIN URINE: NEGATIVE
GLUCOSE, UA: NEGATIVE mg/dL
KETONES UR: NEGATIVE mg/dL
LEUKOCYTES UA: NEGATIVE
Nitrite: NEGATIVE
PH: 5 (ref 5.0–8.0)
PROTEIN: NEGATIVE mg/dL
Specific Gravity, Urine: 1.014 (ref 1.005–1.030)

## 2016-11-22 LAB — BLOOD GAS, ARTERIAL
ACID-BASE EXCESS: 3.3 mmol/L — AB (ref 0.0–2.0)
BICARBONATE: 29.6 mmol/L — AB (ref 20.0–28.0)
DRAWN BY: 511551
FIO2: 0.55
O2 Saturation: 92.2 %
PATIENT TEMPERATURE: 98.6
pCO2 arterial: 66.1 mmHg (ref 32.0–48.0)
pH, Arterial: 7.274 — ABNORMAL LOW (ref 7.350–7.450)
pO2, Arterial: 73.9 mmHg — ABNORMAL LOW (ref 83.0–108.0)

## 2016-11-22 LAB — LIPID PANEL
CHOL/HDL RATIO: 5.2 ratio
Cholesterol: 136 mg/dL (ref 0–200)
HDL: 26 mg/dL — ABNORMAL LOW (ref >40)
LDL Cholesterol: 55 mg/dL (ref 0–99)
Triglycerides: 275 mg/dL — ABNORMAL HIGH (ref <150)
VLDL: 55 mg/dL — AB (ref 0–40)

## 2016-11-22 LAB — SODIUM, URINE, RANDOM: SODIUM UR: 33 mmol/L

## 2016-11-22 LAB — HEMOGLOBIN A1C
HEMOGLOBIN A1C: 7.2 % — AB (ref 4.8–5.6)
Mean Plasma Glucose: 159.94 mg/dL

## 2016-11-22 LAB — MRSA PCR SCREENING: MRSA by PCR: POSITIVE — AB

## 2016-11-22 LAB — TROPONIN I
TROPONIN I: 0.11 ng/mL — AB (ref <0.03)
Troponin I: 0.13 ng/mL (ref <0.03)
Troponin I: 0.13 ng/mL (ref <0.03)

## 2016-11-22 LAB — BRAIN NATRIURETIC PEPTIDE: B Natriuretic Peptide: 29.4 pg/mL (ref 0.0–100.0)

## 2016-11-22 LAB — D-DIMER, QUANTITATIVE (NOT AT ARMC): D DIMER QUANT: 1.01 ug{FEU}/mL — AB (ref 0.00–0.50)

## 2016-11-22 MED ORDER — DULOXETINE HCL 60 MG PO CPEP: 60.0000 mg | ORAL_CAPSULE | Freq: Every day | ORAL | Status: DC

## 2016-11-22 MED ORDER — INSULIN ASPART 100 UNIT/ML ~~LOC~~ SOLN
0.0000 [IU] | Freq: Three times a day (TID) | SUBCUTANEOUS | Status: DC
  Administered 2016-11-22 (×3): 3 [IU] via SUBCUTANEOUS
  Administered 2016-11-23: 11 [IU] via SUBCUTANEOUS
  Administered 2016-11-23: 8 [IU] via SUBCUTANEOUS
  Administered 2016-11-23 – 2016-11-24 (×2): 3 [IU] via SUBCUTANEOUS
  Administered 2016-11-24: 8 [IU] via SUBCUTANEOUS

## 2016-11-22 MED ORDER — CHLORHEXIDINE GLUCONATE CLOTH 2 % EX PADS
6.0000 | MEDICATED_PAD | Freq: Every day | CUTANEOUS | Status: DC
  Administered 2016-11-22 – 2016-11-23 (×2): 6 via TOPICAL

## 2016-11-22 MED ORDER — ASPIRIN EC 81 MG PO TBEC
81.0000 mg | DELAYED_RELEASE_TABLET | Freq: Every day | ORAL | Status: DC
  Administered 2016-11-22 – 2016-11-24 (×3): 81 mg via ORAL
  Filled 2016-11-22 (×3): qty 1

## 2016-11-22 MED ORDER — CLOPIDOGREL BISULFATE 75 MG PO TABS
75.0000 mg | ORAL_TABLET | Freq: Every day | ORAL | Status: DC
  Administered 2016-11-22 – 2016-11-24 (×3): 75 mg via ORAL
  Filled 2016-11-22 (×4): qty 1

## 2016-11-22 MED ORDER — HEPARIN SODIUM (PORCINE) 5000 UNIT/ML IJ SOLN
5000.0000 [IU] | Freq: Three times a day (TID) | INTRAMUSCULAR | Status: DC
  Administered 2016-11-22 – 2016-11-23 (×4): 5000 [IU] via SUBCUTANEOUS
  Filled 2016-11-22 (×4): qty 1

## 2016-11-22 MED ORDER — MUPIROCIN 2 % EX OINT
1.0000 "application " | TOPICAL_OINTMENT | Freq: Two times a day (BID) | CUTANEOUS | Status: DC
  Administered 2016-11-22 – 2016-11-24 (×5): 1 via NASAL
  Filled 2016-11-22 (×2): qty 22

## 2016-11-22 MED ORDER — PAROXETINE HCL 20 MG PO TABS: 20.0000 mg | ORAL_TABLET | Freq: Every day | ORAL | Status: DC

## 2016-11-22 MED ORDER — ATORVASTATIN CALCIUM 80 MG PO TABS
80.0000 mg | ORAL_TABLET | Freq: Every day | ORAL | Status: DC
  Administered 2016-11-22 – 2016-11-24 (×3): 80 mg via ORAL
  Filled 2016-11-22 (×3): qty 1

## 2016-11-22 MED ORDER — PERFLUTREN LIPID MICROSPHERE
1.0000 mL | INTRAVENOUS | Status: AC | PRN
  Administered 2016-11-22: 2 mL via INTRAVENOUS
  Filled 2016-11-22: qty 10

## 2016-11-22 MED ORDER — DOXYCYCLINE HYCLATE 100 MG PO TABS
100.0000 mg | ORAL_TABLET | Freq: Two times a day (BID) | ORAL | Status: DC
  Administered 2016-11-22 – 2016-11-24 (×5): 100 mg via ORAL
  Filled 2016-11-22 (×5): qty 1

## 2016-11-22 MED ORDER — INFLUENZA VAC SPLIT QUAD 0.5 ML IM SUSY
0.5000 mL | PREFILLED_SYRINGE | INTRAMUSCULAR | Status: DC
Start: 2016-11-23 — End: 2016-11-24

## 2016-11-22 MED ORDER — OXYCODONE-ACETAMINOPHEN 5-325 MG PO TABS
1.0000 | ORAL_TABLET | Freq: Once | ORAL | Status: AC
  Administered 2016-11-22: 1 via ORAL
  Filled 2016-11-22: qty 1

## 2016-11-22 MED ORDER — INSULIN ASPART 100 UNIT/ML ~~LOC~~ SOLN
0.0000 [IU] | Freq: Every day | SUBCUTANEOUS | Status: DC
  Administered 2016-11-22 – 2016-11-23 (×2): 3 [IU] via SUBCUTANEOUS

## 2016-11-22 MED ORDER — ACETAMINOPHEN 500 MG PO TABS
1000.0000 mg | ORAL_TABLET | Freq: Four times a day (QID) | ORAL | Status: DC | PRN
  Administered 2016-11-22: 1000 mg via ORAL
  Filled 2016-11-22: qty 2

## 2016-11-22 MED ORDER — OXYCODONE-ACETAMINOPHEN 5-325 MG PO TABS
1.0000 | ORAL_TABLET | ORAL | Status: DC | PRN
  Administered 2016-11-22 – 2016-11-23 (×4): 1 via ORAL
  Filled 2016-11-22 (×4): qty 1

## 2016-11-22 MED ORDER — OXYCODONE-ACETAMINOPHEN 5-325 MG PO TABS: 1.0000 | ORAL_TABLET | Freq: Once | ORAL | Status: AC

## 2016-11-22 MED ORDER — LIDOCAINE HCL 2 % EX GEL
1.0000 "application " | Freq: Once | CUTANEOUS | Status: AC
  Administered 2016-11-22: 1 via URETHRAL
  Filled 2016-11-22: qty 5

## 2016-11-22 NOTE — Progress Notes (Signed)
  Echocardiogram 2D Echocardiogram with Definity has been performed.  Benjamin Bush 11/22/2016, 1:57 PM

## 2016-11-22 NOTE — Progress Notes (Signed)
Patient once again placed on O2 @ 6L/[place] so that he can eat/drink, per patient request.  Sats remain in mid 90s on O2 @ 6L/[place] w/RR 14-18

## 2016-11-22 NOTE — Progress Notes (Signed)
Patient removed from BiPAP 40% and placed on Venti mask at 45%.  Able to maintain sats at 92-94% and RR16.  Will continue to monitor and wean as able.  Patient needs to go for Nuc Med VQ scan and is required to be on O2 @ 6L/[place] for exam.  Will monitor to see if stable enough for this exam this AM.

## 2016-11-22 NOTE — Consult Note (Signed)
The patient has been seen in conjunction with Berton Bon, NP. All aspects of care have been considered and discussed. The patient has been personally interviewed, examined, and all clinical data has been reviewed.   Elevated troponin likely represents demand ischemia. Current chest pain history is been existent over the last 3-4 years and is atypical. He does have history of coronary stents. No EKG evidence of acute ischemia. Do not believe ischemic evaluation is indicated at this time.  Severe hypoxia of uncertain cause. Patient also has CO2 retention. This speaks of probable underlying pulmonary disease/obesity hypoventilation syndrome. Pulmonary consultation should be considered.  2-D Doppler echocardiogram to exclude intracardiac shunt in setting of hypoxia with prior percutaneous ASD/PFO closure 2010.  Recommend IV therapeutic heparin until PE is excluded.    Cardiology Consult    Patient ID: Benjamin Bush MRN: [id number], DOB/AGE: 1955/12/17   Admit date: 11/21/2016 Date of Consult: 11/22/2016  Primary Physician: Patient, No Pcp Per Primary Cardiologist: B. Dulce Sellar, MD  Requesting Provider: C. Rama, MD  Patient Profile    Benjamin Bush is a 61 y.o. male with a history of cad s/p pci, chronic diastolic CHF, hypertension, hyperlipidemia. OSA, DM, obesity and pulmonary embolism who is being seen today for the evaluation of elevated troponins at the request of Dr. Darnelle Catalan.  Past Medical History   Past Medical History:  Diagnosis Date  . (HFpEF) heart failure with preserved ejection fraction (HCC)    a. 06/2016 Echo: Ef >55%, gr1 DD, dil RV w/ nl syst fxn, mildly dil LA.  Marland Kitchen CAD (coronary artery disease)    a. s/p stenting x 2 (RCA/LCX)- [place]; b. 2014 Cath: mild RCA/LCX ISR.  Marland Kitchen Depression   . Diabetes mellitus without complication (HCC)   . Headache   . Hypercholesteremia   . Hypertension   . Morbid obesity (HCC)   . OSA (obstructive sleep apnea)   . PFO (patent  foramen ovale)    a. s/p percutaneous closure @ [facility], [facility]).  . PUD (peptic ulcer disease)     Past Surgical History:  Procedure Laterality Date  . CORONARY ANGIOPLASTY WITH STENT PLACEMENT    . HERNIA REPAIR    . JOINT REPLACEMENT Left    knee replacement  . SHOULDER SURGERY    . vetebral fusion       Allergies  Allergies  Allergen Reactions  . Codeine Swelling    History of Present Illness    The patient has a history of CAD with cardiac intervention in 2014, 2 stents in [place]. He was admitted to [facility] recently for CHF exacerbation. He was given Lasix but not urinating much. Dry weight is approximately 300 pounds. Is currently at 331 pounds. He was treated in ICU earlier this year at [facility] for hypercapnic respiratory failure and diastolic heart failure exacerbation.He uses oxygen and BiPAP at home.   At his primary care appointment on Wednesday the patient was found to have a low oxygen level and was sent to [facility]. He was treated at [facility] for fluid overload and diuresed. He left on Friday (yesterday) against medical advice because he had to pick up his Brother from the hospital. He returned to the hospital later that night on 11/22/16 for continued difficulty breathing and is being treated for hypercapnic respiratory failure and diastolic CHF. A d-dimer was mildly elevated at 1.01 and a V/Q scan has been ordered. BNP was normal at 29.4. Chest x-ray showed findings consistent  with hypoventilation edema.  He states that he has continued to have chest pain since his stents in 2014 which is stabbing in nature, lasting for a few seconds, nonradiating and not associated with activity. He is having shortness of breath which is chronic for him and chronic 3 pillow orthopnea. He says that he has gained 8 pounds in the last 3 months but has had no increase in lower extremity edema or abdominal fullness. He says that he has tried increasing  his torsemide several times without any effect. He has sleep apnea but is not currently using CPAP as it was taken away about 6 months ago due to nonusage. He says that he has had a productive cough for the last 6 months and has been treated one time with a Z-Pak.   His most recent echocardiogram on 07/02/16 was technically difficult due to chest wall/lung interference but showed mild left ventricular hypertrophy with normal systolic function, EF > 55% and grade 1 diastolic dysfunction, dilated right ventricle with normal systolic function, mildlydilated left atrium.  Inpatient Medications    . aspirin EC  81 mg Oral Daily  . atorvastatin  80 mg Oral Daily  . Chlorhexidine Gluconate Cloth  6 each Topical Q0600  . clopidogrel  75 mg Oral Q breakfast  . doxycycline  100 mg Oral Q12H  . heparin  5,000 Units Subcutaneous Q8H  . [START ON 11/23/2016] Influenza vac split quadrivalent PF  0.5 mL Intramuscular Tomorrow-1000  . insulin aspart  0-15 Units Subcutaneous TID WC  . insulin aspart  0-5 Units Subcutaneous QHS  . mupirocin ointment  1 application Nasal BID  . oxyCODONE-acetaminophen  1 tablet Oral Once    Family History    Family History  Problem Relation Age of Onset  . CAD Mother        MI in her 46s  . Hypertension Mother   . CAD Father        CABG in his 91s    Social History    Social History   Social History  . Marital status: Married    Spouse name: N/A  . Number of children: 2  . Years of education: N/A   Occupational History  .      Disabled   Social History Main Topics  . Smoking status: Never Smoker  . Smokeless tobacco: Current User    Types: Chew     Comment: Chews tobacco  . Alcohol use No  . Drug use: No  . Sexual activity: Yes   Other Topics Concern  . Not on file   Social History Narrative  . No narrative on file     Review of Systems    General:  No chills, fever, night sweats or weight changes.  Cardiovascular:  No chest pain, dyspnea on  exertion, edema, orthopnea, palpitations, paroxysmal nocturnal dyspnea. Dermatological: No rash, lesions/masses Respiratory: No cough, dyspnea Urologic: No hematuria, dysuria Abdominal:   No nausea, vomiting, diarrhea, bright red blood per rectum, melena, or hematemesis Neurologic:  No visual changes, wkns, changes in mental status. All other systems reviewed and are otherwise negative except as noted above.  Physical Exam    Blood pressure 111/64, pulse 90, temperature 98.3 F (36.8 C), temperature source Axillary, resp. rate 19, height 6' (1.829 m), weight (!) 331 lb 9.2 oz (150.4 kg), SpO2 90 %.  General: Pleasant, NAD Psych: Normal affect. Neuro: Alert and oriented X 3. Moves all extremities spontaneously. HEENT: Normal  Neck: Supple without bruits  or JVD. Lungs:  Resp regular and unlabored, CTA. Heart: RRR no s3, s4, or murmurs. Abdomen: Soft, non-tender, non-distended, BS + x 4.  Extremities: No clubbing, cyanosis or edema. DP/PT/Radials 2+ and equal bilaterally.  Labs    Troponin (Point of Care Test) No results for input(s): TROPIPOC in the last 72 hours.  Recent Labs  11/22/16 0111 11/22/16 0617  TROPONINI 0.13* 0.13*   Lab Results  Component Value Date   WBC 18.0 (H) 11/22/2016   HGB 11.5 (L) 11/22/2016   HCT 36.4 (L) 11/22/2016   MCV 83.9 11/22/2016   PLT 202 11/22/2016     Recent Labs Lab 11/22/16 0111  NA 135  K 4.7  CL 91*  CO2 29  BUN 84*  CREATININE 4.00*  CALCIUM 7.6*  PROT 6.8  BILITOT 0.6  ALKPHOS 50  ALT 17  AST 20  GLUCOSE 171*   No results found for: CHOL, HDL, LDLCALC, TRIG Lab Results  Component Value Date   DDIMER 1.01 (H) 11/22/2016     Radiology Studies    Dg Chest 2 View  Result Date: 11/22/2016 CLINICAL DATA:  Hypoxia and congestive heart failure chest radiograph 11/21/2016 EXAM: CHEST  2 VIEW COMPARISON:  Chest radiograph 11/21/2016. FINDINGS: Shallow lung inflation with persistent cardiomegaly. Visualization of the  lung bases is poor because of patient body habitus. Mild interstitial pulmonary edema suspected, though this may be exaggerated by shallow inflation. No pneumothorax or pleural effusion. IMPRESSION: Cardiomegaly, shallow lung inflation and suspected mild interstitial pulmonary edema, possibly exaggerated by hypoinflation. Formal upright PA and lateral radiographs are recommended when the patient is able. Electronically Signed   By: Deatra Robinson M.D.   On: 11/22/2016 02:08    ECG & Cardiac Imaging    EKG- NSR at 80 bpm, no acute ischemic changes  Echocardiogram done at [facility] health care on 07/02/2016 Component Name Value Ref Range  LV Diastolic Diameter PLAX 5.3 cm  Mitral E Point Velocity 0.0 m/s  Mitral A Point Velocity 0.0 m/s  Mitral E to A Ratio 0.7   IVS Diastolic Thickness 1.1 cm  LVPW Diastolic Thickness 1.1 cm  LA Volume 100 cm3  Aorta at Sinuses Diameter 3.6 cm  RV DIASTOLIC BASAL DIAMETER 5.0 cm  Result Narrative   Technically difficult study due to chest wall/lung interference  Limited study to evaluate ventricular function  Mile left ventricular hypertrophy with normal systolic function  (ejection fraction > 55%) & grade I diastolic dysfunction  Dilated right ventricle with normal systolic function  Dilated left atrium - mild    [facility]: 04/23/2012 : Successful left anterior descending stenting with a drug-eluting stent reducing initial 99 % stenosis to 0 % residual stenosis and noted to have Type A dissection just proximal to the deployed stent medically managed.  04/30/12:  Repeat cardiac catheterization for recurrent chest pain revealed a calcified intimal dissection relatively deep. He underwent DES placement to the LAD 06/05/12: repeat cardiac catheterization for recurrent chest pain showed patent LAD stent and small subtotal second or third diagonal. Medical management was recommended.   Assessment & Plan    Elevated troponin: troponin level  was 0.13 on 2 occasions, 5 hours apart. Patient admitted with hypercapnic respiratory failure. He has a history of PE, chronic diastolic CHF, ASD repair and sleep apnea (using CPAP). No current chest pain. EKG without ischemic changes. Would continue to follow troponin trend.  Shortness of breath: Patient has had a high oxygen requirement since admission. This is  likely a combination of diastolic CHF and obesity hypoventilation syndrome but could be related to his prior ASD repair. D-dimer was mildly elevated at 1.01 and a VQ scan has been ordered. Recommend therapeutic anticoagulation until PE is ruled out. Will check echocardiogram with special attention to the ASD repair.  Chronic diastolic heart failure:  Patient was normal systolic LV function and most recently grade 1 diastolic dysfunction. He has had trouble with fluid overload and is treated with torsemide 40 mg daily. Patient has reported 28 pound weight gain however no increase in edema. BNP was normal at 29.4. Patient does not appear significantly volume overloaded on clinical exam. We'll check echocardiogram.  History of CAD: usually followed at [facility] care. LAD stent X2 and Type A dissection in 2014 at [facility]. Current treatment includes aspirin 81 mg, Plavix 75 mg, isosorbide 60 mg, (no longer taking ranexa due to cost), metoprolol 100 mg twice a day. The patient has been having chronic central stabbing chest pain lasting a few seconds, non-radiating and not related to activity. He is not currently having any increase in his usual chest discomfort.  Acute kidney injury: SCr 4.  SCr on 07/02/16 was 1.21. Possibly related to recent diuresis, although this is quite significantly elevated for that. Pt had urinary retention prior to arrival. Now with foley. Continue to monitor renal function and avoid nephrotoxic agents.   Obstructive sleep apnea: patient has not been using CPAP as the machine was removed about 6 months ago due to  noncompliance. With the patient's body habitus, use of CPAP is likely pretty important. He should work on getting the CPAP back and use it consistently.  Hypertension: currently well controlled occasional low reading. Continue current medications  Hyperlipidemia: continue statin   Signed, Berton Bon, NP 11/22/2016, 10:04 AM  For questions or updates, please contact   Please consult [email or web address] for contact info under Cardiology/STEMI.

## 2016-11-22 NOTE — Progress Notes (Signed)
Patient placed back on BiPAP 40% FiO2 d/t sleeping; will continue to monitor.

## 2016-11-22 NOTE — Progress Notes (Addendum)
Progress Note    TYRANN DONAHO  WGN:[id number] DOB: Nov 30, 1955  DOA: 11/21/2016 PCP: Patient, No Pcp Per    Brief Narrative:   Chief complaint: Follow-up CHF  Medical records reviewed and are as summarized below:  KAHLEN MORAIS is an 61 y.o. male who was recently hospitalized at [facility] for treatment of his CHF exacerbation but ultimately left AMA, who was transferred from [place] after returning there with a chief complaint of orthopnea, shortness of breath, and nonexertional chest pain.  Assessment/Plan:   Principal Problem:   Acute hypercapnic respiratory failure (HCC)/History of pulmonary embolism/chronic diastolic CHF/OSA Required high oxygen requirement with venti mask. Likely a combination of diastolic CHF and obesity hypoventilation syndrome, but has had + sick contacts/change in sputum. D-dimer mildly elevated at 1.01. ABG showed a pH of 7.274, PCO2 66.1, and PO2 73.9. Troponin elevation to 0.13. BNP WNL at 29.4. Chest x-ray showed findings consistent with hypoventilation and interstitial edema. VQ scan ordered to rule out PE. Has a history of pulmonary embolism according to old records. Echocardiogram done 07/02/16 showed an EF of 55%, grade 1 diastolic dysfunction, normal right ventricular systolic function, no significant valvular abnormalities but was a difficult study. Add doxycycline.  Active Problems:   Headache Reports no relief with tylenol. NSAIDS contraindicated given AKI.    Leukocytosis Nitrite negative on U/A. No obvious pneumonia on chest x-ray. Start empiric doxycycline given sputum production and cough.    Chest pain/history of coronary artery disease Troponin elevation noted. Has a history of stents 2 in 2014. Continue aspirin/Plavix and statin. Will need cardiology evaluation. No active chest pain right now, but had chest pain yesterday. No ischemic changes on EKG done this morning (per my read), TWI V1. Check FLP.    AKI (acute kidney injury)  (HCC)/Acute urinary retention Baseline creatinine is 1.0, current creatinine 4. Patient reported urinary retention prior to admission. Foley placed with good urine output noted.    Type 2 diabetes mellitus (HCC) Hemoglobin A1c 7.2%. Oral hypoglycemics on hold and currently being managed with moderate scale SSI.    Hypertension Antihypertensives on hold secondary to soft blood pressure.    MRSA carrier Decontamination therapy ordered.    Morbid obesity Body mass index is 44.97 kg/m.   Family Communication/Anticipated D/C date and plan/Code Status   DVT prophylaxis: Heparin ordered. Code Status: DO NOT RESUSCITATE  Family Communication: No family at the bedside. Disposition Plan: Home when respiratory status stable and cardiology consultation performed.   Medical Consultants:    Cardiology   Anti-Infectives:    Doxycycline 11/22/16--->  Subjective:   Says he has a splitting headache, and that he is having trouble breathing. No current chest pain. Hungry and irritable. Says he has a cough productive of grey sputum and + sick contacts.  Objective:    Vitals:   11/22/16 0311 11/22/16 0323 11/22/16 0400 11/22/16 0644  BP:   (!) 85/46   Pulse: 84 75 78   Resp: 15 10 (!) 0   Temp:   98.3 F (36.8 C)   TempSrc:   Axillary   SpO2: 96% 94% 92%   Weight:    (!) 150.4 kg (331 lb 9.2 oz)  Height:        Intake/Output Summary (Last 24 hours) at 11/22/16 0729 Last data filed at 11/22/16 0545  Gross per 24 hour  Intake                0 ml  Output  1550 ml  Net            -1550 ml   Filed Weights   11/21/16 2335 11/22/16 0644  Weight: (!) 152.3 kg (335 lb 11.2 oz) (!) 150.4 kg (331 lb 9.2 oz)    Exam: General: Mildly agitated and irritable. Cardiovascular: Heart sounds show a regular rate, and rhythm. No gallops or rubs. No murmurs. No JVD. Lungs: Clear to auscultation bilaterally with fair air movement. No rales, rhonchi or wheezes. Mildly  labored. Abdomen: Soft, nontender, nondistended with normal active bowel sounds. No masses. No hepatosplenomegaly. Neurological: Alert and oriented 3. Moves all extremities 4 with equal strength. Cranial nerves II through XII grossly intact. Skin: Warm and dry. No rashes or lesions. Extremities: No clubbing or cyanosis. No edema. Pedal pulses 2+. Psychiatric: Mood and affect are irritable. Insight and judgment are fair.   Data Reviewed:   I have personally reviewed following labs and imaging studies:  Labs: Labs show the following:   Basic Metabolic Panel:  Recent Labs Lab 11/22/16 0111  NA 135  K 4.7  CL 91*  CO2 29  GLUCOSE 171*  BUN 84*  CREATININE 4.00*  CALCIUM 7.6*   GFR Estimated Creatinine Clearance: 30 mL/min (A) (by C-G formula based on SCr of 4 mg/dL (H)). Liver Function Tests:  Recent Labs Lab 11/22/16 0111  AST 20  ALT 17  ALKPHOS 50  BILITOT 0.6  PROT 6.8  ALBUMIN 3.6   CBC:  Recent Labs Lab 11/22/16 0111  WBC 18.0*  HGB 11.5*  HCT 36.4*  MCV 83.9  PLT 202   Cardiac Enzymes:  Recent Labs Lab 11/22/16 0111  TROPONINI 0.13*   CBG:  Recent Labs Lab 11/21/16 2351  GLUCAP 163*   D-Dimer:  Recent Labs  11/22/16 0204  DDIMER 1.01*   Hgb A1c:  Recent Labs  11/22/16 0204  HGBA1C 7.2*    Microbiology Recent Results (from the past 240 hour(s))  MRSA PCR Screening     Status: Abnormal   Collection Time: 11/21/16 11:59 PM  Result Value Ref Range Status   MRSA by PCR POSITIVE (A) NEGATIVE Final    Comment:        The GeneXpert MRSA Assay (FDA approved for NASAL specimens only), is one component of a comprehensive MRSA colonization surveillance program. It is not intended to diagnose MRSA infection nor to guide or monitor treatment for MRSA infections. RESULT CALLED TO, READ BACK BY AND VERIFIED WITHLeonette Most RN [id number] 11/22/16 A BROWNING     Procedures and diagnostic studies:  11/22/16: Dg Chest 2 View: My  independent review of the image shows: Poor lung volumes but likely interstitial edema is present.    Medications:   . aspirin EC  81 mg Oral Daily  . atorvastatin  80 mg Oral Daily  . Chlorhexidine Gluconate Cloth  6 each Topical Q0600  . clopidogrel  75 mg Oral Q breakfast  . DULoxetine  60 mg Oral Daily  . heparin  5,000 Units Subcutaneous Q8H  . [START ON 11/23/2016] Influenza vac split quadrivalent PF  0.5 mL Intramuscular Tomorrow-1000  . insulin aspart  0-15 Units Subcutaneous TID WC  . insulin aspart  0-5 Units Subcutaneous QHS  . mupirocin ointment  1 application Nasal BID  . PARoxetine  20 mg Oral Daily   Continuous Infusions:   LOS: 1 day   RAMA,CHRISTINA  [facility] Pager [phone]. If unable to reach me by pager, please call my cell phone at [phone].  *Please refer to [email or web address], password [email or web address] to get updated schedule on who will round on this patient, as hospitalists switch teams weekly. If 7PM-7AM, please contact night-coverage at [email or web address], password [email or web address] for any overnight needs.  11/22/2016, 7:29 AM

## 2016-11-22 NOTE — Progress Notes (Signed)
Patients cash sent to security to be locked up.Cash verified by Natalia Leatherwood, RN's. Copy secure envelope place in front of patients chart. Patient aware of cash being locked up.

## 2016-11-22 NOTE — Plan of Care (Signed)
Problem: Safety: Goal: Ability to remain free from injury will improve Outcome: Progressing Pt needs continued education of the importance of moving and repositioning in the bed in order to prevent pressure injuries

## 2016-11-22 NOTE — H&P (Addendum)
History and Physical    Benjamin Bush ZOX:[id number] DOB: 09-23-1955 DOA: 11/21/2016  PCP: dr Clint Guy med Patient coming from: [place] [facility]    Chief Complaint: sob  HPI: Benjamin Bush is a 61 y.o. male with medical history significant of  Admitted to [facility] recently, signed out ama this morning to pick up brother.  There 3 nights. Says treated for chf exacerbation. Given lasix. But not urinating much. Dry weight approximately 300 pounds. Troubled by shortness of breath. Can't lie flat. Non-smoker. Occasional dry cough. Had non-exertional chest pain 2 days ago. Has been dribbling urine for days. Says was on prostate med but ran out and order hasn't come in. Was on bipap in hospital. Sleeps sitting up. No chest pain currently, more comfortable with oxygen running. On oxygen at home, on bipap at home. Returned to hospital today because of difficulty breathing.   MICU stay earlier this year at [facility] for hypercapnic respiratory failure. Treated for dchf exacerbation.     ED Course: labs, xray, venti mask  Review of Systems: As per HPI otherwise 10 point review of systems negative.    Past Medical History:  Diagnosis Date  . CAD (coronary artery disease)   . Congestive heart failure (CHF) (HCC)   . Diabetes mellitus without complication (HCC)   . Dyspnea   . Headache   . Hypercholesteremia   . Hypertension   . PUD (peptic ulcer disease)     Past Surgical History:  Procedure Laterality Date  . CORONARY ANGIOPLASTY WITH STENT PLACEMENT    . HERNIA REPAIR    . JOINT REPLACEMENT Left    knee replacement  . SHOULDER SURGERY    . vetebral fusion       reports that he has never smoked. His smokeless tobacco use includes Chew. He reports that he does not drink alcohol or use drugs.  Allergies  Allergen Reactions  . Codeine Swelling    Family History  Problem Relation Age of Onset  . CAD Mother        MI in her 67s  . CAD Father        CABG in his  5s    Prior to Admission medications   Medication Sig Start Date End Date Taking? Authorizing Provider  amLODipine (NORVASC) 10 MG tablet Take 2 tablets (20 mg total) by mouth daily. 02/26/13   Coolidge Breeze, MD  aspirin EC 81 MG tablet Take 81 mg by mouth daily.    [provider]  atorvastatin (LIPITOR) 80 MG tablet Take 80 mg by mouth daily.    [provider]  clopidogrel (PLAVIX) 75 MG tablet Take 75 mg by mouth daily with breakfast.    [provider]  diclofenac sodium (VOLTAREN) 1 % GEL Apply 4 g topically 4 (four) times daily as needed.    [provider]  DULoxetine (CYMBALTA) 60 MG capsule Take 60 mg by mouth daily.    [provider]  furosemide (LASIX) 20 MG tablet Take 20 mg by mouth daily as needed. edema    [provider]  gabapentin (NEURONTIN) 300 MG capsule Take 300-900 mg by mouth 3 (three) times daily. 1 capsule at am and lunch and 3 capsules at bedtime.    [provider]  glipiZIDE (GLUCOTROL XL) 10 MG 24 hr tablet Take 10 mg by mouth daily with breakfast.    [provider]  isosorbide mononitrate (IMDUR) 30 MG 24 hr tablet Take 30 mg by  mouth daily.    [provider]  metFORMIN (GLUCOPHAGE) 500 MG tablet Take 500 mg by mouth 2 (two) times daily with a meal.    [provider]  metoprolol (LOPRESSOR) 100 MG tablet Take 1 tablet (100 mg total) by mouth 2 (two) times daily. 02/26/13   Coolidge Breeze, MD  PARoxetine (PAXIL) 20 MG tablet Take 20 mg by mouth daily.    [provider]  potassium chloride SA (K-DUR,KLOR-CON) 20 MEQ tablet Take 20 mEq by mouth daily.    [provider]  ranolazine (RANEXA) 1000 MG SR tablet Take 1 tablet (1,000 mg total) by mouth 2 (two) times daily. 02/25/13   Coolidge Breeze, MD  Testosterone (ANDROGEL PUMP) 20.25 MG/ACT (1.62%) GEL Place 6 sprays onto the skin daily.    [provider]    Physical Exam: Vitals:     11/21/16 2335  BP: 121/64  Pulse: 91  Resp: 19  Temp: 97.6 F (36.4 C)  TempSrc: Oral  SpO2: 93%  Weight: (!) 152.3 kg (335 lb 11.2 oz)  Height: 6' (1.829 m)    Constitutional: NAD, calm, comfortable Vitals:   11/21/16 2335  BP: 121/64  Pulse: 91  Resp: 19  Temp: 97.6 F (36.4 C)  TempSrc: Oral  SpO2: 93%  Weight: (!) 152.3 kg (335 lb 11.2 oz)  Height: 6' (1.829 m)   Eyes: PERRL, lids and conjunctivae normal ENMT: Mucous membranes are moist.  Neck: normal, supple, no masses, no thyromegaly Respiratory: faint rales at bases, no tachypnea, no wheeze Cardiovascular: Regular rate and rhythm, no murmurs / rubs / gallops. No extremity edema. 2+ pedal pulses. No carotid bruits.  Abdomen: no tenderness, no masses palpated. No hepatosplenomegaly. Bowel sounds positive.  Musculoskeletal: no clubbing / cyanosis. No joint deformity upper and lower extremities. Good ROM, no contractures. Normal muscle tone.  Skin: no rashes, lesions, ulcers. No induration Neurologic: CN 2-12 grossly intact. Sensation intact, DTR normal. Strength 5/5 in all 4.  Psychiatric: Normal judgment and insight. Alert and oriented x 3. Normal mood.     Labs on Admission: I have personally reviewed following labs and imaging studies  CBC: No results for input(s): WBC, NEUTROABS, HGB, HCT, MCV, PLT in the last 168 hours. Basic Metabolic Panel: No results for input(s): NA, K, CL, CO2, GLUCOSE, BUN, CREATININE, CALCIUM, MG, PHOS in the last 168 hours. GFR: CrCl cannot be calculated (Patient's most recent lab result is older than the maximum 21 days allowed.). Liver Function Tests: No results for input(s): AST, ALT, ALKPHOS, BILITOT, PROT, ALBUMIN in the last 168 hours. No results for input(s): LIPASE, AMYLASE in the last 168 hours. No results for input(s): AMMONIA in the last 168 hours. Coagulation Profile: No results for input(s): INR, PROTIME in the last 168 hours. Cardiac Enzymes: No results for  input(s): CKTOTAL, CKMB, CKMBINDEX, TROPONINI in the last 168 hours. BNP (last 3 results) No results for input(s): PROBNP in the last 8760 hours. HbA1C: No results for input(s): HGBA1C in the last 72 hours. CBG:  Recent Labs Lab 11/21/16 2351  GLUCAP 163*   Lipid Profile: No results for input(s): CHOL, HDL, LDLCALC, TRIG, CHOLHDL, LDLDIRECT in the last 72 hours. Thyroid Function Tests: No results for input(s): TSH, T4TOTAL, FREET4, T3FREE, THYROIDAB in the last 72 hours. Anemia Panel: No results for input(s): VITAMINB12, FOLATE, FERRITIN, TIBC, IRON, RETICCTPCT in the last 72 hours. Urine analysis: No results found for: COLORURINE, APPEARANCEUR, LABSPEC, PHURINE, GLUCOSEU, HGBUR, BILIRUBINUR, KETONESUR, PROTEINUR, UROBILINOGEN, NITRITE, LEUKOCYTESUR  Radiological Exams on Admission: No results found.   [facility] labs reviewed on paper. Significant for WBCs 20.3, H 12.2, sodium 135, K 5, creatinine 4.3, bnp 309, troponin 0.12, abg 7.33/56/61/29.5, bun 83, x-ray without significant consolidation or pulmonary edema  EKG: Independently reviewed. No ischemic changes  Assessment/Plan Active Problems:   Chest pain   Acute hypercapnic respiratory failure (HCC)   CAD (coronary artery disease)   AKI (acute kidney injury) (HCC)   Acute urinary retention   Chronic diastolic CHF (congestive heart failure) (HCC)   Obstructive sleep apnea   Type 2 diabetes mellitus (HCC)   Pulmonary embolism (HCC)  49M here with  # Hx PE - details uncertain # Acute hypercapnic respiratory failure with respiratory acidosis- hypoxic and hypercapnic at [facility], breathing symptoms and O2 normalized with venti mask. On bipap at home. Suspect obesity hypoventilation syndrome. No LE edema, no pulmonary edema, and normal bnp, think dCHF less of a factor. Given unclear history of PE, will also check d dimer. No signs dvt. Wells score 3 (intermediate) - bipap  - ABG - f/u d dimer, if elevated would  start heparin while pursuing v/q scan  # Postrenal urinary tract obstruction - possible # Acute kidney injury - cr 4 from baseline of around 1. patient endorses recent history of inability to make more than dribbles of urine, and cites a history of bph. Quite possible this is a post-renal obstructive process. Does have history of pre-renal AKI 2/2 dCHF exacerbation, but as above think that less likely. Hospitalized until today, says was receiving IV diuresis, so also possible overly-aggressive diuresis is the source of aki. - will place Foley catheter, repeat BMP in AM - may need nephrology referral - f/u urinalysis - will calculate FEUrea (has had recent lasix) - I/Os  # Diastolic dysfunction - on 06/2016 [facility] tte. Preserved ef. bnp today wnl, no LE edema, no pulmonary edema, do not think significant contributor to current clinical picture - I/Os  # Hypertension - pressures initially soft at [place], increased   #CAD - history 2 stents 2014. Had episode of chest pain earlier today. Troponin mildly elevated, though in setting of significant AKI. - serial troponins, tele, am ekg - holding home ranolazine given significant aki  # Hypertension  - hold home imdur, metopralol given low normal bp  # DM - a1c - SSI - hold home metformin, glipizide   DVT prophylaxis: heparin Code Status: dnr, confirmed w/ patient Family Communication: brother Benjamin Bush [phone] Disposition Plan: home Consults called: none Admission status: sdu   Silvano Bilis MD [facility] Pager [phone]  If 7PM-7AM, please contact night-coverage [email or web address] Password [email or web address]  11/22/2016, 12:46 AM

## 2016-11-22 NOTE — Progress Notes (Signed)
CRITICAL VALUE ALERT  Critical Value: Trop  Date & Time Notied: 11/22/2016 0216  Provider Notified: Linton Flemings  Orders Received/Actions taken: no new orders received

## 2016-11-23 ENCOUNTER — Inpatient Hospital Stay (HOSPITAL_COMMUNITY): Payer: Medicare PPO

## 2016-11-23 ENCOUNTER — Other Ambulatory Visit (HOSPITAL_COMMUNITY): Payer: 59

## 2016-11-23 LAB — BASIC METABOLIC PANEL
ANION GAP: 9 (ref 5–15)
BUN: 70 mg/dL — ABNORMAL HIGH (ref 6–20)
CO2: 33 mmol/L — ABNORMAL HIGH (ref 22–32)
Calcium: 8.5 mg/dL — ABNORMAL LOW (ref 8.9–10.3)
Chloride: 97 mmol/L — ABNORMAL LOW (ref 101–111)
Creatinine, Ser: 1.87 mg/dL — ABNORMAL HIGH (ref 0.61–1.24)
GFR calc Af Amer: 44 mL/min — ABNORMAL LOW (ref >60)
GFR, EST NON AFRICAN AMERICAN: 38 mL/min — AB (ref >60)
Glucose, Bld: 210 mg/dL — ABNORMAL HIGH (ref 65–99)
POTASSIUM: 4.5 mmol/L (ref 3.5–5.1)
SODIUM: 139 mmol/L (ref 135–145)

## 2016-11-23 LAB — CBC
HEMATOCRIT: 37.8 % — AB (ref 39.0–52.0)
HEMOGLOBIN: 11.5 g/dL — AB (ref 13.0–17.0)
MCH: 26.3 pg (ref 26.0–34.0)
MCHC: 30.4 g/dL (ref 30.0–36.0)
MCV: 86.5 fL (ref 78.0–100.0)
Platelets: 201 10*3/uL (ref 150–400)
RBC: 4.37 MIL/uL (ref 4.22–5.81)
RDW: 16.2 % — ABNORMAL HIGH (ref 11.5–15.5)
WBC: 8 10*3/uL (ref 4.0–10.5)

## 2016-11-23 LAB — UREA NITROGEN, URINE: UREA NITROGEN UR: 374 mg/dL

## 2016-11-23 LAB — GLUCOSE, CAPILLARY
GLUCOSE-CAPILLARY: 283 mg/dL — AB (ref 65–99)
Glucose-Capillary: 171 mg/dL — ABNORMAL HIGH (ref 65–99)
Glucose-Capillary: 303 mg/dL — ABNORMAL HIGH (ref 65–99)
Glucose-Capillary: 288 mg/dL — ABNORMAL HIGH (ref 65–99)

## 2016-11-23 MED ORDER — HEPARIN BOLUS VIA INFUSION
3000.0000 [IU] | Freq: Once | INTRAVENOUS | Status: AC
  Administered 2016-11-23: 3000 [IU] via INTRAVENOUS
  Filled 2016-11-23: qty 3000

## 2016-11-23 MED ORDER — ASPIRIN-ACETAMINOPHEN-CAFFEINE 250-250-65 MG PO TABS
2.0000 | ORAL_TABLET | Freq: Four times a day (QID) | ORAL | Status: DC | PRN
  Administered 2016-11-23: 2 via ORAL
  Filled 2016-11-23 (×2): qty 2

## 2016-11-23 MED ORDER — ENOXAPARIN SODIUM 80 MG/0.8ML ~~LOC~~ SOLN
75.0000 mg | SUBCUTANEOUS | Status: DC
  Administered 2016-11-23: 16:00:00 75 mg via SUBCUTANEOUS
  Filled 2016-11-23: qty 0.8

## 2016-11-23 MED ORDER — HEPARIN (PORCINE) IN NACL 100-0.45 UNIT/ML-% IJ SOLN
1800.0000 [IU]/h | INTRAMUSCULAR | Status: DC
  Administered 2016-11-23: 1800 [IU]/h via INTRAVENOUS
  Filled 2016-11-23: qty 250

## 2016-11-23 MED ORDER — ORAL CARE MOUTH RINSE
15.0000 mL | Freq: Two times a day (BID) | OROMUCOSAL | Status: DC
  Administered 2016-11-23 – 2016-11-24 (×2): 15 mL via OROMUCOSAL

## 2016-11-23 MED ORDER — GLIPIZIDE ER 10 MG PO TB24
10.0000 mg | ORAL_TABLET | Freq: Every day | ORAL | Status: DC
  Administered 2016-11-24: 10 mg via ORAL
  Filled 2016-11-23: qty 1

## 2016-11-23 MED ORDER — METFORMIN HCL 500 MG PO TABS: 1000.0000 mg | ORAL_TABLET | Freq: Two times a day (BID) | ORAL | Status: DC

## 2016-11-23 MED ORDER — TECHNETIUM TO 99M ALBUMIN AGGREGATED
4.1000 | Freq: Once | INTRAVENOUS | Status: AC
  Administered 2016-11-23: 4.1 via INTRAVENOUS

## 2016-11-23 MED ORDER — ACETAMINOPHEN 325 MG PO TABS: 650.0000 mg | ORAL_TABLET | Freq: Four times a day (QID) | ORAL | Status: DC | PRN

## 2016-11-23 MED ORDER — TECHNETIUM TC 99M DIETHYLENETRIAME-PENTAACETIC ACID
33.0000 | Freq: Once | INTRAVENOUS | Status: AC
  Administered 2016-11-23: 33 via RESPIRATORY_TRACT

## 2016-11-23 MED ORDER — FUROSEMIDE 10 MG/ML IJ SOLN
40.0000 mg | Freq: Two times a day (BID) | INTRAMUSCULAR | Status: DC
  Administered 2016-11-23 – 2016-11-24 (×2): 40 mg via INTRAVENOUS
  Filled 2016-11-23 (×2): qty 4

## 2016-11-23 MED ORDER — ISOSORBIDE MONONITRATE ER 60 MG PO TB24
60.0000 mg | ORAL_TABLET | Freq: Every day | ORAL | Status: DC
  Administered 2016-11-23 – 2016-11-24 (×2): 60 mg via ORAL
  Filled 2016-11-23 (×2): qty 1

## 2016-11-23 NOTE — Progress Notes (Signed)
Pt c/o headache 9/10; states he has had this headache since Wednesday of last week and nothing is helping.  Refuses Percocet at this time stating "what's the use of taking it if it doesn't work".  Resting comfortably on the BiPAP after returning from two trips - one to Nuc Med for VQ scan and one to xray for CXR.  VSS; sats 93% on BiPAP @ 40% FiO2.

## 2016-11-23 NOTE — Progress Notes (Addendum)
Progress Note    Benjamin Bush  UEA:[id number] DOB: 1955/11/09  DOA: 11/21/2016 PCP: Patient, No Pcp Per    Brief Narrative:   Chief complaint: Follow-up CHF  Medical records reviewed and are as summarized below:  Benjamin Bush is an 61 y.o. male who was recently hospitalized at [facility] for treatment of his CHF exacerbation but ultimately left AMA, who was transferred from [place] after returning there with a chief complaint of orthopnea, shortness of breath, and nonexertional chest pain.  Assessment/Plan:   Principal Problem:   Acute hypercapnic respiratory failure (HCC)/History of pulmonary embolism/chronic diastolic CHF/OSA/Prior percutaneous ASD/PFO closure 2010 Required high oxygen requirement with venti mask/BiPAP on admission. Acute respiratory failure is likely a combination of diastolic CHF and obesity hypoventilation syndrome, but has had + sick contacts/change in sputum. D-dimer mildly elevated at 1.01 but VQ negative (d/c empiric heparin). ABG showed a pH of 7.274, PCO2 66.1, and PO2 73.9. Troponin elevation to 0.13, but trend has been flat. BNP WNL at 29.4. Chest x-ray showed findings consistent with hypoventilation and interstitial edema. Echocardiogram 07/02/16 showed an EF of 55%, grade 1 diastolic dysfunction, normal right ventricular systolic function, no significant valvular abnormalities but was a difficult study. Cardiologist has recommended a repeat study to exclude intracardiac shunt in the setting of hypoxia and prior percutaneous ASD/PFO closure 2010. Repeat echo shows EF 60-65 percent with normal diastolic parameters. No defect or patent foramen ovale identified. Continue doxycycline. Repeat chest x-ray shows cardiomegaly and pulmonary vascular congestion. I/O balance significantly negative but weight rising. Lasix 40 mg twice a day ordered.  Active Problems:   Headache Reports no relief with tylenol. NSAIDS contraindicated given AKI. Oxycodone also ineffective.  Try exedrin migraine.    Leukocytosis Nitrite negative on U/A. No obvious pneumonia on chest x-ray. Empiric doxycycline started 11/22/16 secondary to sputum production and cough. WBC normalized.    Chest pain/history of coronary artery disease Troponin elevation noted. Has a history of stents 2 in 2014. Continue aspirin/Plavix and statin. No ischemic changes on EKG. Evaluated by cardiologist who does not believe an ischemic evaluation is indicated. Repeat echo negative for regional wall motion abnormalities. Resume Imdur.    AKI (acute kidney injury) (HCC)/Acute urinary retention Baseline creatinine is 1.0, current creatinine 1.87 (reduced from 4.0 on admission). Patient reported urinary retention prior to admission. Foley placed with good urine output noted. AKI was secondary to post renal causes. Continue Foley catheter. Voiding trial when more stable. Likely will need urology follow-up. Monitor renal function closely with diuresis.    Type 2 diabetes mellitus (HCC) Hemoglobin A1c 7.2%. Oral hypoglycemics on hold and currently being managed with moderate scale SSI. Will resume glipizide, but metformin remains on hold secondary to low GFR.    Hypertension Antihypertensives on hold secondary to soft blood pressure.    MRSA carrier Decontamination therapy ordered.    Morbid obesity Body mass index is 46.05 kg/m.   Family Communication/Anticipated D/C date and plan/Code Status   DVT prophylaxis: Lovenox ordered. Code Status: DO NOT RESUSCITATE  Family Communication: No family at the bedside. Disposition Plan: Home when respiratory status stable, continues to require BiPAP.   Medical Consultants:    Cardiology   Anti-Infectives:    Doxycycline 11/22/16--->  Subjective:   Resting comfortably on BiPAP. Still reports a headache, unrelieved by Percocet.  Objective:    Vitals:   11/22/16 2334 11/23/16 0022 11/23/16 0327 11/23/16 0332  BP: 127/63  122/65   Pulse: (!) 102  99  90   Resp: Temp: 99.1 F (37.3 C)  99.4 F (37.4 C)   TempSrc: Oral  Axillary   SpO2: 92% 93% 91%   Weight:    (!) 154 kg (339 lb 8.1 oz)  Height:        Intake/Output Summary (Last 24 hours) at 11/23/16 0721 Last data filed at 11/23/16 0610  Gross per 24 hour  Intake              580 ml  Output             7200 ml  Net            -6620 ml   Filed Weights   11/22/16 0644 11/22/16 1700 11/23/16 0332  Weight: (!) 150.4 kg (331 lb 9.2 oz) (!) 151.5 kg (334 lb) (!) 154 kg (339 lb 8.1 oz)    Exam: General: No acute distress. Lying in bed with CPAP on. Cardiovascular: Heart sounds show a regular rate, and rhythm. No gallops or rubs. No murmurs. No JVD. Lungs: Clear to auscultation bilaterally with good fair movement. No rales, rhonchi or wheezes. Abdomen: Soft/obese, nontender, nondistended with normal active bowel sounds. No masses. No hepatosplenomegaly. Neurological: Alert and oriented 3. Moves all extremities 4 with equal strength. Cranial nerves II through XII grossly intact. Skin: Warm and dry. No rashes or lesions. Extremities: No clubbing or cyanosis. 1+ edema. Pedal pulses 2+. Psychiatric: Mood and affect are depressed/flat. Insight and judgment are fair.  Data Reviewed:   I have personally reviewed following labs and imaging studies:  Labs: Labs show the following:   Basic Metabolic Panel:  Recent Labs Lab 11/22/16 0111 11/23/16 0214  NA 135 139  K 4.7 4.5  CL 91* 97*  CO2 29 33*  GLUCOSE 171* 210*  BUN 84* 70*  CREATININE 4.00* 1.87*  CALCIUM 7.6* 8.5*   GFR Estimated Creatinine Clearance: 65.1 mL/min (A) (by C-G formula based on SCr of 1.87 mg/dL (H)). Liver Function Tests:  Recent Labs Lab 11/22/16 0111  AST 20  ALT 17  ALKPHOS 50  BILITOT 0.6  PROT 6.8  ALBUMIN 3.6   CBC:  Recent Labs Lab 11/22/16 0111 11/23/16 0214  WBC 18.0* 8.0  HGB 11.5* 11.5*  HCT 36.4* 37.8*  MCV 83.9 86.5  PLT 202 201   Cardiac  Enzymes:  Recent Labs Lab 11/22/16 0111 11/22/16 0617 11/22/16 1128  TROPONINI 0.13* 0.13* 0.11*   CBG:  Recent Labs Lab 11/21/16 2351 11/22/16 0738 11/22/16 1130 11/22/16 1545 11/22/16 2154  GLUCAP 163* 152* 182* 165* 258*   D-Dimer:  Recent Labs  11/22/16 0204  DDIMER 1.01*   Hgb A1c:  Recent Labs  11/22/16 0204  HGBA1C 7.2*    Microbiology  11/21/16: MRSA PCR negative.  Procedures and diagnostic studies:  11/22/16: Dg Chest 2 View: Cardiomegaly, shallow lung inflation and suspected mild interstitial pulmonary edema, possibly exaggerated by hypoinflation. Formal upright PA and lateral radiographs are recommended when the patient is able.  11/23/16: VQ scan: 1. Normal perfusion imaging.  Negative for acute pulmonary embolus. 2. The ventilation images suggest regions of air trapping throughout the right upper and mid lung. This may represent underlying small airways disease.  11/23/16: DG Chest 2 View: My independent review of the image shows: Better quality film and the ones done 11/22/16. Central vascular congestion and cardiomegaly evident. No pleural effusions or evidence of pneumonia.     Medications:   . aspirin EC  81  mg Oral Daily  . atorvastatin  80 mg Oral Daily  . Chlorhexidine Gluconate Cloth  6 each Topical Q0600  . clopidogrel  75 mg Oral Q breakfast  . doxycycline  100 mg Oral Q12H  . heparin  5,000 Units Subcutaneous Q8H  . Influenza vac split quadrivalent PF  0.5 mL Intramuscular Tomorrow-1000  . insulin aspart  0-15 Units Subcutaneous TID WC  . insulin aspart  0-5 Units Subcutaneous QHS  . mupirocin ointment  1 application Nasal BID   Continuous Infusions:   LOS: 2 days   Benjamin Bush  [facility] Pager [phone]. If unable to reach me by pager, please call my cell phone at [phone].  *Please refer to [email or web address], password [email or web address] to get updated schedule on who will round on this patient, as hospitalists switch teams  weekly. If 7PM-7AM, please contact night-coverage at [email or web address], password [email or web address] for any overnight needs.  11/23/2016, 7:21 AM

## 2016-11-23 NOTE — Progress Notes (Signed)
ANTICOAGULATION CONSULT NOTE - Initial Consult  Pharmacy Consult for heparin Indication: r/o PE  Allergies  Allergen Reactions  . Codeine Swelling    Patient Measurements: Height: 6' (182.9 cm) Weight: (!) 339 lb 8.1 oz (154 kg) IBW/kg (Calculated) : 77.6 Heparin Dosing Weight: 115kg  Vital Signs: Temp: 99.4 F (37.4 C) (10/07 0327) Temp Source: Axillary (10/07 0327) BP: 122/65 (10/07 0327) Pulse Rate: 90 (10/07 0327)  Labs:  Recent Labs  11/22/16 0111 11/22/16 0617 11/22/16 1128 11/23/16 0214  HGB 11.5*  --   --  11.5*  HCT 36.4*  --   --  37.8*  PLT 202  --   --  201  CREATININE 4.00*  --   --  1.87*  TROPONINI 0.13* 0.13* 0.11*  --     Estimated Creatinine Clearance: 65.1 mL/min (A) (by C-G formula based on SCr of 1.87 mg/dL (H)).   Medical History: Past Medical History:  Diagnosis Date  . (HFpEF) heart failure with preserved ejection fraction (HCC)    a. 06/2016 Echo: Ef >55%, gr1 DD, dil RV w/ nl syst fxn, mildly dil LA.  Marland Kitchen CAD (coronary artery disease)    a. s/p stenting x 2 (RCA/LCX)- [place], [place]; b. 2014 Cath: mild RCA/LCX ISR.  Marland Kitchen Depression   . Diabetes mellitus without complication (HCC)   . Headache   . Hypercholesteremia   . Hypertension   . Morbid obesity (HCC)   . OSA (obstructive sleep apnea)   . PFO (patent foramen ovale)    a. s/p percutaneous closure @ [facility], [facility]).  . PUD (peptic ulcer disease)     Medications:  Prescriptions Prior to Admission  Medication Sig Dispense Refill Last Dose  . albuterol (PROVENTIL) (2.5 MG/3ML) 0.083% nebulizer solution Take 2.5 mg by nebulization 2 (two) times daily as needed for wheezing.   Past Week at Unknown time  . Cholecalciferol (VITAMIN D3) 1000 units CAPS Take 1,000 mg by mouth daily.   Past Week at Unknown time  . diclofenac sodium (VOLTAREN) 1 % GEL Apply 4 g topically 4 (four) times daily as needed.   Past Month at Unknown time  . fluticasone (FLONASE) 50 MCG/ACT nasal spray  Place 2 sprays into the nose daily as needed.   Past Week at Unknown time  . gabapentin (NEURONTIN) 400 MG capsule Take 400-800 mg by mouth See admin instructions. 400 mg at breakfast & lunch. 800 mg at bedtime   11/19/2016  . metFORMIN (GLUCOPHAGE) 1000 MG tablet Take 1,000 mg by mouth 2 (two) times daily with a meal.   11/19/2016  . metoprolol (LOPRESSOR) 100 MG tablet Take 1 tablet (100 mg total) by mouth 2 (two) times daily.   11/19/2016 at 8a  . oxyCODONE-acetaminophen (PERCOCET) 10-325 MG tablet Take 1 tablet by mouth every 6 (six) hours as needed for pain.   11/19/2016  . traZODone (DESYREL) 100 MG tablet Take 400 mg by mouth at bedtime.   Past Week at Unknown time  . aspirin EC 81 MG tablet Take 81 mg by mouth daily.   11/19/16  . clopidogrel (PLAVIX) 75 MG tablet Take 75 mg by mouth daily with breakfast.   11/19/2016  . furosemide (LASIX) 20 MG tablet Take 40 mg by mouth daily as needed for fluid. edema    Not Taking at Unknown time  . gabapentin (NEURONTIN) 300 MG capsule Take 300-900 mg by mouth 3 (three) times daily. 1 capsule at am and lunch and 3 capsules at bedtime.  Not Taking at Unknown time  . glipiZIDE (GLUCOTROL XL) 10 MG 24 hr tablet Take 10 mg by mouth daily with breakfast.   11/19/2016  . isosorbide mononitrate (IMDUR) 60 MG 24 hr tablet Take 60 mg by mouth daily.   11-19-16  . nitroGLYCERIN (NITROSTAT) 0.4 MG SL tablet Place 1 tablet under the tongue every 5 (five) minutes x 2 doses as needed.   unk  . omeprazole (PRILOSEC) 40 MG capsule Take 40 mg by mouth daily.   Not Taking at Unknown time  . pantoprazole (PROTONIX) 40 MG tablet Take 40 mg by mouth daily.   11/19/2016  . potassium chloride SA (K-DUR,KLOR-CON) 20 MEQ tablet Take 20 mEq by mouth daily.   11/19/2016  . ranolazine (RANEXA) 1000 MG SR tablet Take 1 tablet (1,000 mg total) by mouth 2 (two) times daily. (Patient not taking: Reported on 11/22/2016) 30 tablet 0 Not Taking at Unknown time  . rosuvastatin (CRESTOR) 40 MG  tablet Take 40 mg by mouth daily.   11-19-16  . torsemide (DEMADEX) 20 MG tablet Take 40 mg by mouth daily.   11-19-16   Scheduled:  . aspirin EC  81 mg Oral Daily  . atorvastatin  80 mg Oral Daily  . Chlorhexidine Gluconate Cloth  6 each Topical Q0600  . clopidogrel  75 mg Oral Q breakfast  . doxycycline  100 mg Oral Q12H  . Influenza vac split quadrivalent PF  0.5 mL Intramuscular Tomorrow-1000  . insulin aspart  0-15 Units Subcutaneous TID WC  . insulin aspart  0-5 Units Subcutaneous QHS  . mupirocin ointment  1 application Nasal BID    Assessment: 61yo male admitted w/ respiratory failure, concern for PE given elevated D-dimer, awaiting VQ scan, to begin heparin.  Goal of Therapy:  Heparin level 0.3-0.7 units/ml Monitor platelets by anticoagulation protocol: Yes   Plan:  Rec'd SQ heparin ~1hr ago; will give heparin 3000 units IV bolus x1 followed by gtt at 1800 units/hr and monitor heparin levels and CBC.  Vernard Gambles, PharmD, BCPS  11/23/2016,7:26 AM

## 2016-11-23 NOTE — Progress Notes (Signed)
Pt transported to Imaging/Nuc Med for VQ scan and 2view CXR.  Transports per wheelchair on O2 @ 6L/[place] and IV Heparin infusing.

## 2016-11-23 NOTE — Progress Notes (Signed)
Progress Note  Patient Name: Benjamin Bush Date of Encounter: 11/23/2016  Primary Cardiologist: Dulce Sellar  Subjective   He feels better. He wants to go home. Sitting at bedside.  Inpatient Medications    Scheduled Meds: . aspirin EC  81 mg Oral Daily  . atorvastatin  80 mg Oral Daily  . Chlorhexidine Gluconate Cloth  6 each Topical Q0600  . clopidogrel  75 mg Oral Q breakfast  . doxycycline  100 mg Oral Q12H  . Influenza vac split quadrivalent PF  0.5 mL Intramuscular Tomorrow-1000  . insulin aspart  0-15 Units Subcutaneous TID WC  . insulin aspart  0-5 Units Subcutaneous QHS  . mupirocin ointment  1 application Nasal BID   Continuous Infusions: . heparin 1,800 Units/hr (11/23/16 0753)   PRN Meds: acetaminophen, oxyCODONE-acetaminophen   Vital Signs    Vitals:   11/23/16 0022 11/23/16 0327 11/23/16 0332 11/23/16 0800  BP:  122/65  (!) 110/53  Pulse: 99 90    Resp: 15 11    Temp:  99.4 F (37.4 C)  98.5 F (36.9 C)  TempSrc:  Axillary  Oral  SpO2: 93% 91%    Weight:   (!) 339 lb 8.1 oz (154 kg)   Height:        Intake/Output Summary (Last 24 hours) at 11/23/16 1001 Last data filed at 11/23/16 0610  Gross per 24 hour  Intake              340 ml  Output             7200 ml  Net            -6860 ml   Filed Weights   11/22/16 0644 11/22/16 1700 11/23/16 0332  Weight: (!) 331 lb 9.2 oz (150.4 kg) (!) 334 lb (151.5 kg) (!) 339 lb 8.1 oz (154 kg)    Telemetry    Sinus rhythm and sinus tachycardia. - Personally Reviewed  ECG    Sinus rhythm with inferior Q waves. No acute ischemic changes noted. - Personally Reviewed  Physical Exam  Morbid obesity. GEN: No acute distress.   Neck: No JVD Cardiac: RRR, no murmurs, rubs, or gallops.  Respiratory: Clear to auscultation bilaterally. GI: Soft, nontender, non-distended  MS: No edema; No deformity. Neuro:  Nonfocal  Psych: Normal affect   Labs    Chemistry Recent Labs Lab 11/22/16 0111 11/23/16 0214    NA 135 139  K 4.7 4.5  CL 91* 97*  CO2 29 33*  GLUCOSE 171* 210*  BUN 84* 70*  CREATININE 4.00* 1.87*  CALCIUM 7.6* 8.5*  PROT 6.8  --   ALBUMIN 3.6  --   AST 20  --   ALT 17  --   ALKPHOS 50  --   BILITOT 0.6  --   GFRNONAA 15* 38*  GFRAA 17* 44*  ANIONGAP 15 9     Hematology Recent Labs Lab 11/22/16 0111 11/23/16 0214  WBC 18.0* 8.0  RBC 4.34 4.37  HGB 11.5* 11.5*  HCT 36.4* 37.8*  MCV 83.9 86.5  MCH 26.5 26.3  MCHC 31.6 30.4  RDW 16.0* 16.2*  PLT 202 201    Cardiac Enzymes Recent Labs Lab 11/22/16 0111 11/22/16 0617 11/22/16 1128  TROPONINI 0.13* 0.13* 0.11*   No results for input(s): TROPIPOC in the last 168 hours.   BNP Recent Labs Lab 11/22/16 0111  BNP 29.4     DDimer  Recent Labs Lab 11/22/16 0204  DDIMER 1.01*  Radiology    Dg Chest 2 View  Result Date: 11/22/2016 CLINICAL DATA:  Hypoxia and congestive heart failure chest radiograph 11/21/2016 EXAM: CHEST  2 VIEW COMPARISON:  Chest radiograph 11/21/2016. FINDINGS: Shallow lung inflation with persistent cardiomegaly. Visualization of the lung bases is poor because of patient body habitus. Mild interstitial pulmonary edema suspected, though this may be exaggerated by shallow inflation. No pneumothorax or pleural effusion. IMPRESSION: Cardiomegaly, shallow lung inflation and suspected mild interstitial pulmonary edema, possibly exaggerated by hypoinflation. Formal upright PA and lateral radiographs are recommended when the patient is able. Electronically Signed   By: Deatra Robinson M.D.   On: 11/22/2016 02:08    Cardiac Studies   2-D Doppler echocardiogram 11/22/2016: ------------------------------------------------------------------- Study Conclusions  - Left ventricle: The cavity size was normal. Wall thickness was   increased in a pattern of mild LVH. Systolic function was normal.   The estimated ejection fraction was in the range of 60% to 65%.   Wall motion was normal; there  were no regional wall motion   abnormalities. Left ventricular diastolic function parameters   were normal. - Left atrium: The atrium was moderately dilated. - Right ventricle: The cavity size was mildly dilated. Wall   thickness was normal. - Atrial septum: No defect or patent foramen ovale was identified.   Patient Profile     61 y.o. male male with a history of cad s/p pci, chronic diastolic CHF, hypertension, hyperlipidemia. OSA, DM, obesity and pulmonary embolism who is being seen today for the evaluation of elevated troponins at the request of Dr. Darnelle Catalan.  Assessment & Plan    1. Acute on chronic respiratory failure, improved with 6 L diuresis. Etiology is likely multifactorial including intrinsic lung disease, obstructive sleep apnea, diastolic left heart dysfunction, and potentially some component of acute on chronic cor pulmonale. Rule out pulmonary embolism. Continue IV heparin until excluded. 2. Chronic diastolic heart failure, with EF 60-65% and "normal diastolic parameters". Agree with diuresis as you have done, monitoring blood pressure and kidney function to avoid overdiuresis. 3. Elevated troponin in this setting is not related to acute coronary syndrome and felt secondary to demand ischemia. No ischemic evaluation is planned.  For questions or updates, please contact [facility] Please consult [email or web address] for contact info under Cardiology/STEMI.      Signed, Lesleigh Noe, MD  11/23/2016, 10:01 AM

## 2016-11-23 NOTE — Progress Notes (Signed)
Patient informed that Metformin cannot be restarted at this time d/t current kidney function per Dr. Darnelle Catalan.  Glipizide restarted.  Patient w/concerns as to why these two medications were not continued on admission and current CBGs running in high 200s/300s.  Dr. Darnelle Catalan suggests adjusting SSI as needed.

## 2016-11-24 DIAGNOSIS — J9602 Acute respiratory failure with hypercapnia: Secondary | ICD-10-CM

## 2016-11-24 LAB — BASIC METABOLIC PANEL
ANION GAP: 9 (ref 5–15)
BUN: 34 mg/dL — AB (ref 6–20)
CALCIUM: 9 mg/dL (ref 8.9–10.3)
CO2: 35 mmol/L — AB (ref 22–32)
Chloride: 98 mmol/L — ABNORMAL LOW (ref 101–111)
Creatinine, Ser: 1.14 mg/dL (ref 0.61–1.24)
GFR calc Af Amer: >60 mL/min (ref >60)
GFR calc non Af Amer: >60 mL/min (ref >60)
GLUCOSE: 180 mg/dL — AB (ref 65–99)
POTASSIUM: 4.4 mmol/L (ref 3.5–5.1)
Sodium: 142 mmol/L (ref 135–145)

## 2016-11-24 LAB — GLUCOSE, CAPILLARY
GLUCOSE-CAPILLARY: 295 mg/dL — AB (ref 65–99)
Glucose-Capillary: 179 mg/dL — ABNORMAL HIGH (ref 65–99)
Glucose-Capillary: 182 mg/dL — ABNORMAL HIGH (ref 65–99)

## 2016-11-24 MED ORDER — ATORVASTATIN CALCIUM 80 MG PO TABS: 80.0000 mg | ORAL_TABLET | Freq: Every day | ORAL | 0 refills | Status: DC

## 2016-11-24 MED ORDER — DOXYCYCLINE HYCLATE 100 MG PO TABS: 100.0000 mg | ORAL_TABLET | Freq: Two times a day (BID) | ORAL | 0 refills | Status: AC

## 2016-11-24 MED ORDER — FUROSEMIDE 10 MG/ML IJ SOLN: 40.0000 mg | Freq: Every day | INTRAMUSCULAR | Status: DC

## 2016-11-24 NOTE — Progress Notes (Signed)
Patient refuses to wait any longer for [facility] to bring oxygen tank; patient walks to nursing counter and demands that he leaves right now.  RN explains physician recommendation that patient have oxygen prior to discharge but patient states he will be fine for his 45 minute drive home.  Patient and brother both (patient's transporter home) had to be wheeled out of the facility due to SOB.  Both parties adamant they are leaving NOW.  [facility] to be called to notify of patient leaving early without oxygen.

## 2016-11-24 NOTE — Care Management (Signed)
Update:  CM informed that the portable oxygen tank that pt has for transport is empty.  CM spoke with Dwayne with [employer] and informed that pt will need portable tank and that pt has discharge orders - agency will deliver portable oxygen tank to pt today in room before 7pm - pt agreed to wait for tank for discharge

## 2016-11-24 NOTE — Progress Notes (Addendum)
Discharge Planning: Spoke to pt and his PCP is Dr Eloisa Northern, appt scheduled for 11/28/2016 at 1115 am. (added to dc instructions). Requested dc summary be faxed. Faxed dc summary to PCP.  Pt states he was seeing Dr. Dulce Sellar. Contacted office and Dr Dulce Sellar is in [facility]. Pt wants to remain in this office. Appt arranged with Dr. Rozetta Nunnery, Cardiologist. appt on 12/11/2016 at 2:45 pm. Office requested facesheet, dc summary and insurance card.  Christoper Allegra will deliver Trilogy to home on tomorrow from 12-5 pm. Contact person, Verlon Au [phone]. Pt updated and information on dc instructions.  Isidoro Donning RN CCM Case Mgmt phone [phone]

## 2016-11-24 NOTE — Progress Notes (Addendum)
Progress Note  Patient Name: Benjamin Bush Date of Encounter: 11/24/2016  Primary Cardiologist: Dr. Dulce Sellar   Subjective   Feeling much better.  Breathing improved.   Inpatient Medications    Scheduled Meds: . aspirin EC  81 mg Oral Daily  . atorvastatin  80 mg Oral Daily  . Chlorhexidine Gluconate Cloth  6 each Topical Q0600  . clopidogrel  75 mg Oral Q breakfast  . doxycycline  100 mg Oral Q12H  . enoxaparin (LOVENOX) injection  75 mg Subcutaneous Q24H  . furosemide  40 mg Intravenous BID  . glipiZIDE  10 mg Oral Q breakfast  . Influenza vac split quadrivalent PF  0.5 mL Intramuscular Tomorrow-1000  . insulin aspart  0-15 Units Subcutaneous TID WC  . insulin aspart  0-5 Units Subcutaneous QHS  . isosorbide mononitrate  60 mg Oral Daily  . mouth rinse  15 mL Mouth Rinse BID  . mupirocin ointment  1 application Nasal BID   Continuous Infusions:  PRN Meds: acetaminophen, aspirin-acetaminophen-caffeine, oxyCODONE-acetaminophen   Vital Signs    Vitals:   11/24/16 0446 11/24/16 0600 11/24/16 0728 11/24/16 0745  BP: (!) 151/72 (!) 142/77 (!) 158/80 (!) 150/77  Pulse: 63 (!) 59    Resp: 17 16    Temp:    98.5 F (36.9 C)  TempSrc:    Oral  SpO2: 92% 90% 92%   Weight:  (!) 147.7 kg (325 lb 9.9 oz)    Height:        Intake/Output Summary (Last 24 hours) at 11/24/16 0805 Last data filed at 11/24/16 0407  Gross per 24 hour  Intake            554.1 ml  Output             6530 ml  Net          -5975.9 ml   Filed Weights   11/22/16 1700 11/23/16 0332 11/24/16 0600  Weight: (!) 151.5 kg (334 lb) (!) 154 kg (339 lb 8.1 oz) (!) 147.7 kg (325 lb 9.9 oz)    Telemetry    Sinus rhythm.  No events  - Personally Reviewed  ECG    N/a - Personally Reviewed  Physical Exam   GEN: Well-appearing.  No acute distress.   Neck: Unable to assess JVD. Cardiac: RRR, no murmurs, rubs, or gallops.  Respiratory: Clear to auscultation bilaterally. GI: Soft, nontender,  non-distended  MS: No edema; No deformity. Neuro:  Nonfocal  Psych: Normal affect   Labs    Chemistry Recent Labs Lab 11/22/16 0111 11/23/16 0214 11/24/16 0305  NA 135 139 142  K 4.7 4.5 4.4  CL 91* 97* 98*  CO2 29 33* 35*  GLUCOSE 171* 210* 180*  BUN 84* 70* 34*  CREATININE 4.00* 1.87* 1.14  CALCIUM 7.6* 8.5* 9.0  PROT 6.8  --   --   ALBUMIN 3.6  --   --   AST 20  --   --   ALT 17  --   --   ALKPHOS 50  --   --   BILITOT 0.6  --   --   GFRNONAA 15* 38* >60  GFRAA 17* 44* >60  ANIONGAP Hematology Recent Labs Lab 11/22/16 0111 11/23/16 0214  WBC 18.0* 8.0  RBC 4.34 4.37  HGB 11.5* 11.5*  HCT 36.4* 37.8*  MCV 83.9 86.5  MCH 26.5 26.3  MCHC 31.6 30.4  RDW 16.0* 16.2*  PLT 202 201    Cardiac Enzymes Recent Labs Lab 11/22/16 0111 11/22/16 0617 11/22/16 1128  TROPONINI 0.13* 0.13* 0.11*   No results for input(s): TROPIPOC in the last 168 hours.   BNP Recent Labs Lab 11/22/16 0111  BNP 29.4     DDimer  Recent Labs Lab 11/22/16 0204  DDIMER 1.01*     Radiology    Dg Chest 2 View  Result Date: 11/23/2016 CLINICAL DATA:  Heart failure EXAM: CHEST  2 VIEW COMPARISON:  11/22/2016 FINDINGS: There is mild bilateral interstitial prominence. There is no focal parenchymal opacity. There is no pleural effusion or pneumothorax. There is mild stable cardiomegaly. The osseous structures are unremarkable. IMPRESSION: Cardiomegaly with mild pulmonary vascular congestion. Electronically Signed   By: Elige Ko   On: 11/23/2016 12:29   Nm Pulmonary Perf And Vent  Result Date: 11/23/2016 CLINICAL DATA:  61 year old male with intermediate probability for pulmonary embolism. Elevated D-dimer. EXAM: NUCLEAR MEDICINE VENTILATION - PERFUSION LUNG SCAN TECHNIQUE: Ventilation images were obtained in multiple projections using inhaled aerosol Tc-57m DTPA. Perfusion images were obtained in multiple projections after intravenous injection of Tc-39m MAA.  RADIOPHARMACEUTICALS:  Thirty-three mCi Technetium-87m DTPA aerosol inhalation and 4.1 mCi Technetium-69m MAA IV COMPARISON:  Recent CTA PE study performed on 11/19/2016 at [facility] FINDINGS: Ventilation: Patchy areas of increased radiotracer uptake throughout the right lung. Perfusion: No wedge shaped peripheral perfusion defects to suggest acute pulmonary embolism. IMPRESSION: 1. Normal perfusion imaging.  Negative for acute pulmonary embolus. 2. The ventilation images suggest regions of air trapping throughout the right upper and mid lung. This may represent underlying small airways disease. Electronically Signed   By: Malachy Moan M.D.   On: 11/23/2016 12:00    Cardiac Studies   Echo 11/22/16:  Personally reviewed Study Conclusions  - Left ventricle: The cavity size was normal. Wall thickness was   increased in a pattern of mild LVH. Systolic function was normal.   The estimated ejection fraction was in the range of 60% to 65%.   Wall motion was normal; there were no regional wall motion   abnormalities. Left ventricular diastolic function parameters   were normal. - Left atrium: The atrium was moderately dilated. - Right ventricle: The cavity size was mildly dilated. Wall   thickness was normal. - Atrial septum: No defect or patent foramen ovale was identified.  Patient Profile     60 y.o. male with prior PE, chronic diastolic heart failure, hypertension, hyperlipidemia, OSA, and prior ASD/PFO closure admitted with acute hypercarbic respiratory failure.    Assessment & Plan    # Hypercarbic respiratory failure: # Acute on chronic diastolic heart failure: # Obesity hypoventilation: V/Q scan was negative for PE.  The chest xray did show mild pulmonary vascular congestion.  [name] had not been compliant with his Bipap at home prior to admission.  Case management is working to help him get a new one.  Respiratory status improving with diuresis.  Given his brisk response  to one dose of IV lasix, we will reduce the dose to daily.  Likely switch to oral tomorrow. # CAD s/pp PCI:  # Elevated troponin: Troponin mildly elevated to 0.13.  This elevation is thought to be due to demand ischemia.  He has not experienced any chest pain.  Echo showed normal systolic function and EKG is without ischemic changes. Continue aspirin, clopidogrel, and Imdur.  Resume home metoprolol.   # AKI: Resolved.  He reported urinary retention prior to admission.  Renal function back to normal with massive diuresis.   # Hypertension: Home antihypertensives were held initially due to low BP.    # Hyperlipidemia: Continue atorvastatin  For questions or updates, please contact [facility] Please consult [email or web address] for contact info under Cardiology/STEMI.      Signed, Chilton Si, MD  11/24/2016, 8:05 AM

## 2016-11-24 NOTE — Discharge Instructions (Signed)
YOU MUST WEAR YOUR BIPAP/CPAP EVERY SINGLE NIGHT OR YOU WILL END UP BACK IN THE HOSPITAL AGAIN, OR EVEN WORSE YOU COULD ACUTELY DECOMPENSATE AT HOME AND NOT MAKE IT BACK TO THE HOSPITAL   Sleep Apnea  Sleep apnea is a condition that affects breathing. People with sleep apnea have moments during sleep when their breathing pauses briefly or gets shallow. Sleep apnea can cause these symptoms:  Trouble staying asleep.  Sleepiness or tiredness during the day.  Irritability.  Loud snoring.  Morning headaches.  Trouble concentrating.  Forgetting things.  Less interest in sex.  Being sleepy for no reason.  Mood swings.  Personality changes.  Depression.  Waking up a lot during the night to pee (urinate).  Dry mouth.  Sore throat.  Follow these instructions at home:  Make any changes in your routine that your doctor recommends.  Eat a healthy, well-balanced diet.  Take over-the-counter and prescription medicines only as told by your doctor.  Avoid using alcohol, calming medicines (sedatives), and narcotic medicines.  Take steps to lose weight if you are overweight.  If you were given a machine (device) to use while you sleep, use it only as told by your doctor.  Do not use any tobacco products, such as cigarettes, chewing tobacco, and e-cigarettes. If you need help quitting, ask your doctor.  Keep all follow-up visits as told by your doctor. This is important. Contact a doctor if:  The machine that you were given to use during sleep is uncomfortable or does not seem to be working.  Your symptoms do not get better.  Your symptoms get worse. Get help right away if:  Your chest hurts.  You have trouble breathing in enough air (shortness of breath).  You have an uncomfortable feeling in your back, arms, or stomach.  You have trouble talking.  One side of your body feels weak.  A part of your face is hanging down (drooping). These symptoms may be an  emergency. Do not wait to see if the symptoms will go away. Get medical help right away. Call your local emergency services (911 in the [place] Do not drive yourself to the hospital. This information is not intended to replace advice given to you by your health care provider. Make sure you discuss any questions you have with your health care provider. Document Released: 11/13/2007 Document Revised: 09/30/2015 Document Reviewed: 11/13/2014 Elsevier Interactive Patient Education  2018 [employer].   Acute Respiratory Failure, Adult  Acute respiratory failure occurs when there is not enough oxygen passing from your lungs to your body. When this happens, your lungs have trouble removing carbon dioxide from the blood. This causes your blood oxygen level to drop too low as carbon dioxide builds up. Acute respiratory failure is a medical emergency. It can develop quickly, but it is temporary if treated promptly. Your lung capacity, or how much air your lungs can hold, may improve with time, exercise, and treatment. What are the causes? There are many possible causes of acute respiratory failure, including:  Lung injury.  Chest injury or damage to the ribs or tissues near the lungs.  Lung conditions that affect the flow of air and blood into and out of the lungs, such as pneumonia, acute respiratory distress syndrome, and cystic fibrosis.  Medical conditions, such as strokes or spinal cord injuries, that affect the muscles and nerves that control breathing.  Blood infection (sepsis).  Inflammation of the pancreas (pancreatitis).  A blood clot in the lungs (pulmonary  embolism).  A large-volume blood transfusion.  Burns.  Near-drowning.  Seizure.  Smoke inhalation.  Reaction to medicines.  Alcohol or drug overdose.  What increases the risk? This condition is more likely to develop in people who have:  A blocked airway.  Asthma.  A condition or disease that damages or weakens  the muscles, nerves, bones, or tissues that are involved in breathing.  A serious infection.  A health problem that blocks the unconscious reflex that is involved in breathing, such as hypothyroidism or sleep apnea.  A lung injury or trauma.  What are the signs or symptoms? Trouble breathing is the main symptom of acute respiratory failure. Symptoms may also include:  Rapid breathing.  Restlessness or anxiety.  Skin, lips, or fingernails that appear blue (cyanosis).  Rapid heart rate.  Abnormal heart rhythms (arrhythmias).  Confusion or changes in behavior.  Tiredness or loss of energy.  Feeling sleepy or having a loss of consciousness.  How is this diagnosed? Your health care provider can diagnose acute respiratory failure with a medical history and physical exam. During the exam, your health care provider will listen to your heart and check for crackling or wheezing sounds in your lungs. Your may also have tests to confirm the diagnosis and determine what is causing respiratory failure. These tests may include:  Measuring the amount of oxygen in your blood (pulse oximetry). The measurement comes from a small device that is placed on your finger, earlobe, or toe.  Other blood tests to measure blood gases and to look for signs of infection.  Sampling your cerebral spinal fluid or tracheal fluid to check for infections.  Chest X-ray to look for fluid in spaces that should be filled with air.  Electrocardiogram (ECG) to look at the heart's electrical activity.  How is this treated? Treatment for this condition usually takes places in a hospital intensive care unit (ICU). Treatment depends on what is causing the condition. It may include one or more treatments until your symptoms improve. Treatment may include:  Supplemental oxygen. Extra oxygen is given through a tube in the nose, a face mask, or a hood.  A device such as a continuous positive airway pressure (CPAP) or  bi-level positive airway pressure (BiPAP or BPAP) machine. This treatment uses mild air pressure to keep the airways open. A mask or other device will be placed over your nose or mouth. A tube that is connected to a motor will deliver oxygen through the mask.  Ventilator. This treatment helps move air into and out of the lungs. This may be done with a bag and mask or a machine. For this treatment, a tube is placed in your windpipe (trachea) so air and oxygen can flow to the lungs.  Extracorporeal membrane oxygenation (ECMO). This treatment temporarily takes over the function of the heart and lungs, supplying oxygen and removing carbon dioxide. ECMO gives the lungs a chance to recover. It may be used if a ventilator is not effective.  Tracheostomy. This is a procedure that creates a hole in the neck to insert a breathing tube.  Receiving fluids and medicines.  Rocking the bed to help breathing.  Follow these instructions at home:  Take over-the-counter and prescription medicines only as told by your health care provider.  Return to normal activities as told by your health care provider. Ask your health care provider what activities are safe for you.  Keep all follow-up visits as told by your health care provider. This is important.  How is this prevented? Treating infections and medical conditions that may lead to acute respiratory failure can help prevent the condition from developing. Contact a health care provider if:  You have a fever.  Your symptoms do not improve or they get worse. Get help right away if:  You are having trouble breathing.  You lose consciousness.  Your have cyanosis or turn blue.  You develop a rapid heart rate.  You are confused. These symptoms may represent a serious problem that is an emergency. Do not wait to see if the symptoms will go away. Get medical help right away. Call your local emergency services (911 in the [place] Do not drive yourself to the  hospital. This information is not intended to replace advice given to you by your health care provider. Make sure you discuss any questions you have with your health care provider. Document Released: 02/08/2013 Document Revised: 09/01/2015 Document Reviewed: 08/22/2015 Elsevier Interactive Patient Education  [employer].

## 2016-11-24 NOTE — Discharge Summary (Signed)
DISCHARGE SUMMARY  Benjamin Bush  MR#: [id number]  DOB:Nov 03, 1955  Date of Admission: 11/21/2016 Date of Discharge: 11/24/2016  Attending Physician:Clementine Soulliere T  Patient's WGN:[id number], No Pcp Per  Consults: [facility]   Disposition: D/C home at insistence of Benjamin Bush   Follow-up Appts:  Follow-up Information    Your Primary Care Doctor Follow up.   Why:  It is very important that you see your Primary Care Doctor in [place] within 5-7 days for a follow-up appointment.         Your Cardiolgoy Doctor (Heart Doctor) Follow up.   Why:  It is very important that you see your Cardiologist in [place] within the next 7-10 days for a follow-up visit.           Tests Needing Follow-up: -assure compliance w/ BIPAP QHS -assess renal fxn and lytes w/ diuretic tx -assess CBG control -assess urinary retention sx and possible need for outpt Urology eval for BPH  Discharge Diagnoses: Acute hypercapnic respiratory failure History of pulmonary embolism Acute exacerbation of Chronic diastolic CHF Decompensated OSA w/ BIPAP noncompliance Hx of percutaneous ASD/PFO closure 2010 Headache History of coronary artery disease AKI (acute kidney injury) Acute urinary retention Type 2 diabetes mellitus Hypertension Morbid obesity - Body mass index is 44.16 kg/m.  Initial presentation: 61 y.o. male who was recently hospitalized at [facility] for treatment of a CHF exacerbation but ultimately left AMA, who was transferred from [place] after returning there with a chief complaint of orthopnea, shortness of breath, and nonexertional chest pain.  Hospital Course:  Acute hypercapnic respiratory failure / History of pulmonary embolism / Acute on chronic diastolic CHF / OSA / Prior percutaneous ASD/PFO closure 2010 Required high oxygen requirement with venti mask/BiPAP on admission. Acute respiratory failure is likely a combination of diastolic CHF exaxc and obesity hypoventilation syndrome,  but has had + sick contacts/change in sputum. D-dimer mildly elevated at 1.01 but VQ negative (d/c empiric heparin). ABG showed a pH of 7.274, PCO2 66.1, and PO2 73.9. Troponin elevation to 0.13, but trend has been flat. BNP WNL at 29.4. Chest x-ray showed findings consistent with hypoventilation and interstitial edema. Echocardiogram 07/02/16 showed an EF of 55%, grade 1 diastolic dysfunction, normal right ventricular systolic function, no significant valvular abnormalities but was a difficult study. Cardiology recommended a repeat study to exclude intracardiac shunt in the setting of hypoxia and prior percutaneous ASD/PFO closure 2010. Repeat echo showed EF 60-65 percent with normal diastolic parameters. No defect or patent foramen ovale identified. Continue doxycycline to complete a 7 day course. Repeat chest x-ray showed cardiomegaly and pulmonary vascular congestion. I/O balance significantly negative.    On 10/8 the Benjamin Bush reported that he felt "better than in a long time" and insisted on being d/c home.  He was no longer acutely ill.  His foley cath was discontinued, and he was able to spontaneously urinate w/o it.  He was counseled on the absolute need to comply w/ his BIPAP every single night, and the connection between his noncompliance and repeat hospitalizations.  He confirmed he already has oxygen at home.    Headache Much improved at time of d/c   Leukocytosis Nitrite negative on U/A. No obvious pneumonia on chest x-ray. Empiric doxycycline started 11/22/16 secondary to sputum production and cough. WBC normalized.  History of coronary artery disease Troponin elevation noted. Has a history of stents 2 in 2014. Continue aspirin/Plavix and statin. No ischemic changes on EKG. Evaluated by cardiologist who does not believe an ischemic evaluation  is indicated. Repeat echo negative for regional wall motion abnormalities. Resume Imdur.  AKI (acute kidney injury) / Acute urinary retention Baseline  creatinine is 1.0, creatinine 4.0 on admission. Patient reported urinary retention prior to admission. Foley placed with good urine output noted. AKI was secondary to post renal causes. Foley catheter discontinued on day of d/c and Benjamin Bush voiding freely without it. Likely will need urology follow-up as an outpt. Renal fxn has returned essentially to his baseline at the time of d/c.    Type 2 diabetes mellitus (HCC) Hemoglobin A1c 7.2%. Oral hypoglycemics on hold during hospital stay. Will resume glipizide and metformin at d/c w/ recovery of renal fxn.    Hypertension Resume usual home BP meds as BP climbing/mildly elevated at time of d/c.    MRSA carrier Decontamination therapy ordered during hospital stay.    Morbid obesity Body mass index is 46.05 kg/m.  Allergies as of 11/24/2016      Reactions   Codeine Swelling      Medication List    STOP taking these medications   furosemide 20 MG tablet Commonly known as:  LASIX   gabapentin 300 MG capsule Commonly known as:  NEURONTIN   gabapentin 400 MG capsule Commonly known as:  NEURONTIN   omeprazole 40 MG capsule Commonly known as:  PRILOSEC   ranolazine 1000 MG SR tablet Commonly known as:  RANEXA   rosuvastatin 40 MG tablet Commonly known as:  CRESTOR   traZODone 100 MG tablet Commonly known as:  DESYREL     TAKE these medications   albuterol (2.5 MG/3ML) 0.083% nebulizer solution Commonly known as:  PROVENTIL Take 2.5 mg by nebulization 2 (two) times daily as needed for wheezing.   aspirin EC 81 MG tablet Take 81 mg by mouth daily.   atorvastatin 80 MG tablet Commonly known as:  LIPITOR Take 1 tablet (80 mg total) by mouth daily.   clopidogrel 75 MG tablet Commonly known as:  PLAVIX Take 75 mg by mouth daily with breakfast.   diclofenac sodium 1 % Gel Commonly known as:  VOLTAREN Apply 4 g topically 4 (four) times daily as needed.   doxycycline 100 MG tablet Commonly known as:  VIBRA-TABS Take 1  tablet (100 mg total) by mouth 2 (two) times daily.   fluticasone 50 MCG/ACT nasal spray Commonly known as:  FLONASE Place 2 sprays into the nose daily as needed.   glipiZIDE 10 MG 24 hr tablet Commonly known as:  GLUCOTROL XL Take 10 mg by mouth daily with breakfast.   isosorbide mononitrate 60 MG 24 hr tablet Commonly known as:  IMDUR Take 60 mg by mouth daily.   metFORMIN 1000 MG tablet Commonly known as:  GLUCOPHAGE Take 1,000 mg by mouth 2 (two) times daily with a meal.   metoprolol tartrate 100 MG tablet Commonly known as:  LOPRESSOR Take 1 tablet (100 mg total) by mouth 2 (two) times daily.   nitroGLYCERIN 0.4 MG SL tablet Commonly known as:  NITROSTAT Place 1 tablet under the tongue every 5 (five) minutes x 2 doses as needed.   oxyCODONE-acetaminophen 10-325 MG tablet Commonly known as:  PERCOCET Take 1 tablet by mouth every 6 (six) hours as needed for pain.   pantoprazole 40 MG tablet Commonly known as:  PROTONIX Take 40 mg by mouth daily.   potassium chloride SA 20 MEQ tablet Commonly known as:  K-DUR,KLOR-CON Take 20 mEq by mouth daily.   torsemide 20 MG tablet Commonly known as:  DEMADEX  Take 40 mg by mouth daily.   Vitamin D3 1000 units Caps Take 1,000 mg by mouth daily.       Day of Discharge BP (!) 150/77 (BP Location: Left Arm)   Pulse (!) 59   Temp 98.5 F (36.9 C) (Oral)   Resp 16   Ht 6' (1.829 m)   Wt (!) 147.7 kg (325 lb 9.9 oz)   SpO2 92%   BMI 44.16 kg/m   Physical Exam: General: No acute respiratory distress - alert and conversant Lungs: Clear to auscultation bilaterally without wheezes or crackles - distant bs th/o all fields Cardiovascular: Regular rate and rhythm without murmur - HS distant  Abdomen: Nontender, morbidly obese, soft, bowel sounds positive, no rebound Extremities: trace B LE edema w/o cyanosis   Basic Metabolic Panel:  Recent Labs Lab 11/22/16 0111 11/23/16 0214 11/24/16 0305  NA 135 139 142  K 4.7  4.5 4.4  CL 91* 97* 98*  CO2 29 33* 35*  GLUCOSE 171* 210* 180*  BUN 84* 70* 34*  CREATININE 4.00* 1.87* 1.14  CALCIUM 7.6* 8.5* 9.0    Liver Function Tests:  Recent Labs Lab 11/22/16 0111  AST 20  ALT 17  ALKPHOS 50  BILITOT 0.6  PROT 6.8  ALBUMIN 3.6    CBC:  Recent Labs Lab 11/22/16 0111 11/23/16 0214  WBC 18.0* 8.0  HGB 11.5* 11.5*  HCT 36.4* 37.8*  MCV 83.9 86.5  PLT 202 201    Cardiac Enzymes:  Recent Labs Lab 11/22/16 0111 11/22/16 0617 11/22/16 1128  TROPONINI 0.13* 0.13* 0.11*   BNP (last 3 results)  Recent Labs  11/22/16 0111  BNP 29.4    CBG:  Recent Labs Lab 11/23/16 1247 11/23/16 1618 11/23/16 2121 11/24/16 0615 11/24/16 0817  GLUCAP 303* 288* 283* 179* 182*    Recent Results (from the past 240 hour(s))  MRSA PCR Screening     Status: Abnormal   Collection Time: 11/21/16 11:59 PM  Result Value Ref Range Status   MRSA by PCR POSITIVE (A) NEGATIVE Final    Comment:        The GeneXpert MRSA Assay (FDA approved for NASAL specimens only), is one component of a comprehensive MRSA colonization surveillance program. It is not intended to diagnose MRSA infection nor to guide or monitor treatment for MRSA infections. RESULT CALLED TO, READ BACK BY AND VERIFIED WITH: Leonette Most RN 1610 11/22/16 A BROWNING      Time spent in discharge (includes decision making & examination of Benjamin Bush): 35 minutes  11/24/2016, 10:50 AM   Lonia Blood, MD [facility] Office  [phone] Pager [phone]  On-Call/Text Page:      [email or web address]      password [facility]

## 2016-11-24 NOTE — Progress Notes (Signed)
Pt discharged; brother present for transportation home.  However, portable oxygen tank that brother brought for transport home was actually empty when RN went to apply tubing to the tank.  Come to find out, this tank was from [facility], and [facility] stated patient did not have and portable O2 tanks from them at patient's home. CM notified to assist w/provision of O2 tanks for transport.

## 2016-11-24 NOTE — Progress Notes (Signed)
Patient states he's ready to "run [place] here".  States he wants to go home TODAY.  Patient continues to have foley catheter in place for acute urinary retention.  Cardiology decreased IV Lasix to daily and start PO tomorrow was the plan.  Message sent to attending re: d/c of foley catheter?  PT consult placed for patient safety eval.  Care Management consult placed to ensure patient has home equipment needs in place for ultimate discharge.  Will continue to assess needs and readiness for discharge.

## 2016-11-24 NOTE — Care Management Note (Addendum)
Case Management Note  Patient Details  Name: Benjamin Bush MRN: [id number] Date of Birth: 1955-12-19  Subjective/Objective:    Pt admitted with hypercarbic resp failure              Action/Plan:  PTA independent from home with brother.  Pt is on 5 liters home oxygen continuous and per pt [employer] picked up equipment approximately 6 months ago - pt states he was told by pulmonologist that he no longer needed it.  Pt will discharge home via private vehicle - pt already has walker, cane and wheelchair in the home.  CM will contact [employer] to inquire about getting BIPAP back in the home - pt informed CM that he spoke with the [employer] representative last week regarding the BIPAP equipment - pt states he was informed that [facility] would provide another BIPAP if pt signed documentation stating he would wear equipment - pt states he is ready to sign the documentation.  Pt has portable oxygen tank for transport home   Expected Discharge Date:  11/24/16               Expected Discharge Plan:  Home/Self Care  In-House Referral:     Discharge planning Services  CM Consult  Post Acute Care Choice:    Choice offered to:     DME Arranged:    DME Agency:     HH Arranged:    HH Agency:     Status of Service:  Completed, signed off  If discussed at [employer] of Stay Meetings, dates discussed:    Additional Comments: Update; CM spoke with [employer] - pt previously had Trilogy equipment not BIPAP - [facility] hospital sent in order for new trilogy 11/21/16 - [facility] has the order and per rep the process has been completed - CM was told by [facility] that no other interventions were needed for pt to receive Trilogy equipment - [employer] resource sent request to respiratory department to contact pt directly to schedule delivery time of equipment in the home - CM verified pts address and phone number with pt while on the phone with the [employer] rep.  CM spoke with attending and he would like pt to move forward with Trilogy and  discontinue referral for BIPAP.  CM informed attending that delivery of equipment has not yet be confirmed as it will depend on [employer]'s resource availability (possibly not today)- attending acknowledged barrier and pt will continue to discharge home today with continuous oxygen.  Pt informed that [facility] will contact him directly to arrange delivery of Trilogy Cherylann Parr, RN 11/24/2016, 10:50 AM

## 2016-11-24 NOTE — Progress Notes (Signed)
This RN spoke w/Leslie at [place] re: BiPAP at home; scheduled to deliver at home tomorrow (11/25/2016) between 12-5pm.  Patient is agreeable to these terms.  Patient calling transportation to get him home at this time.

## 2016-11-26 LAB — HIV ANTIBODY (ROUTINE TESTING W REFLEX): HIV SCREEN 4TH GENERATION: NONREACTIVE

## 2017-01-01 DIAGNOSIS — Z955 Presence of coronary angioplasty implant and graft: Secondary | ICD-10-CM

## 2017-01-01 DIAGNOSIS — E669 Obesity, unspecified: Secondary | ICD-10-CM | POA: Insufficient documentation

## 2017-01-01 DIAGNOSIS — N182 Chronic kidney disease, stage 2 (mild): Secondary | ICD-10-CM | POA: Insufficient documentation

## 2017-01-01 DIAGNOSIS — N183 Chronic kidney disease, stage 3 unspecified: Secondary | ICD-10-CM | POA: Insufficient documentation

## 2017-01-01 DIAGNOSIS — Z8774 Personal history of (corrected) congenital malformations of heart and circulatory system: Secondary | ICD-10-CM | POA: Insufficient documentation

## 2017-01-01 HISTORY — DX: Presence of coronary angioplasty implant and graft: Z95.5

## 2017-01-01 HISTORY — DX: Personal history of (corrected) congenital malformations of heart and circulatory system: Z87.74

## 2017-01-01 HISTORY — DX: Chronic kidney disease, stage 3 unspecified: N18.30

## 2017-04-09 ENCOUNTER — Encounter: Admit: 2017-04-09 | Discharge: 2017-04-10 | Disposition: A | Discharge status: Home / Self Care | Payer: MEDICARE

## 2017-04-09 MED ORDER — PENICILLIN V POTASSIUM 500 MG TABLET
ORAL_TABLET | Freq: Four times a day (QID) | ORAL | 0 refills | 0 days | Status: CP
Start: 2017-04-09 — End: 2017-04-19

## 2017-07-27 ENCOUNTER — Encounter: Admit: 2017-07-27 | Discharge: 2017-07-30 | Payer: MEDICARE

## 2017-07-27 DIAGNOSIS — R079 Chest pain, unspecified: Principal | ICD-10-CM

## 2017-07-28 DIAGNOSIS — R079 Chest pain, unspecified: Principal | ICD-10-CM

## 2017-07-29 MED ORDER — ISOSORBIDE MONONITRATE ER 30 MG TABLET,EXTENDED RELEASE 24 HR
ORAL_TABLET | Freq: Every day | ORAL | 3 refills | 0 days | Status: CP
Start: 2017-07-29 — End: 2018-01-04

## 2017-07-29 MED ORDER — METOPROLOL TARTRATE 100 MG TABLET
ORAL_TABLET | Freq: Two times a day (BID) | ORAL | 0 refills | 0 days | Status: CP
Start: 2017-07-29 — End: 2017-09-03

## 2017-08-03 ENCOUNTER — Non-Acute Institutional Stay: Admit: 2017-08-03 | Discharge: 2017-08-04 | Payer: MEDICARE

## 2017-08-03 ENCOUNTER — Encounter: Admit: 2017-08-03 | Discharge: 2017-08-04 | Payer: MEDICARE

## 2017-08-03 DIAGNOSIS — R079 Chest pain, unspecified: Principal | ICD-10-CM

## 2017-08-11 ENCOUNTER — Encounter: Admit: 2017-08-11 | Discharge: 2017-08-12 | Payer: MEDICARE | Attending: Pulmonary Disease | Primary: Pulmonary Disease

## 2017-08-11 DIAGNOSIS — R0609 Other forms of dyspnea: Secondary | ICD-10-CM

## 2017-08-11 DIAGNOSIS — J9610 Chronic respiratory failure, unspecified whether with hypoxia or hypercapnia: Principal | ICD-10-CM

## 2017-08-11 MED ORDER — TIOTROPIUM BROMIDE 18 MCG CAPSULE WITH INHALATION DEVICE
ORAL_CAPSULE | Freq: Every day | RESPIRATORY_TRACT | 11 refills | 0 days | Status: CP
Start: 2017-08-11 — End: 2018-08-26

## 2017-08-11 MED ORDER — ALBUTEROL SULFATE HFA 90 MCG/ACTUATION AEROSOL INHALER
Freq: Four times a day (QID) | RESPIRATORY_TRACT | 11 refills | 0 days | Status: CP | PRN
Start: 2017-08-11 — End: ?

## 2017-08-11 MED ORDER — BUDESONIDE-FORMOTEROL HFA 160 MCG-4.5 MCG/ACTUATION AEROSOL INHALER
Freq: Two times a day (BID) | RESPIRATORY_TRACT | 6 refills | 0 days | Status: CP
Start: 2017-08-11 — End: 2017-09-03

## 2017-08-12 ENCOUNTER — Ambulatory Visit: Admit: 2017-08-12 | Discharge: 2017-08-12 | Payer: MEDICARE

## 2017-08-12 DIAGNOSIS — J9610 Chronic respiratory failure, unspecified whether with hypoxia or hypercapnia: Principal | ICD-10-CM

## 2017-08-26 DIAGNOSIS — R079 Chest pain, unspecified: Principal | ICD-10-CM

## 2017-08-27 ENCOUNTER — Encounter
Admit: 2017-08-27 | Discharge: 2017-08-27 | Disposition: A | Discharge status: Home / Self Care | Payer: MEDICARE | Attending: Emergency Medicine

## 2017-09-01 ENCOUNTER — Encounter
Admit: 2017-09-01 | Discharge: 2017-09-02 | Payer: MEDICARE | Attending: Cardiovascular Disease | Primary: Cardiovascular Disease

## 2017-09-01 DIAGNOSIS — I5033 Acute on chronic diastolic (congestive) heart failure: Secondary | ICD-10-CM

## 2017-09-01 DIAGNOSIS — R0602 Shortness of breath: Principal | ICD-10-CM

## 2017-09-01 DIAGNOSIS — Z6841 Body Mass Index (BMI) 40.0 and over, adult: Secondary | ICD-10-CM

## 2017-09-01 DIAGNOSIS — R079 Chest pain, unspecified: Secondary | ICD-10-CM

## 2017-09-01 MED ORDER — NITROGLYCERIN 0.4 MG SUBLINGUAL TABLET: 0 mg | tablet | 3 refills | 0 days | Status: AC

## 2017-09-01 MED ORDER — NITROGLYCERIN 0.4 MG SUBLINGUAL TABLET
ORAL_TABLET | SUBLINGUAL | 3 refills | 0.00000 days | Status: CP | PRN
Start: 2017-09-01 — End: 2018-09-01

## 2017-09-03 ENCOUNTER — Encounter: Admit: 2017-09-03 | Discharge: 2017-09-04 | Payer: MEDICARE | Attending: Adult Health | Primary: Adult Health

## 2017-09-03 DIAGNOSIS — N183 Chronic kidney disease, stage 3 (moderate): Secondary | ICD-10-CM

## 2017-09-03 DIAGNOSIS — I5033 Acute on chronic diastolic (congestive) heart failure: Principal | ICD-10-CM

## 2017-09-03 MED ORDER — TORSEMIDE 20 MG TABLET
ORAL_TABLET | 6 refills | 0 days | Status: CP
Start: 2017-09-03 — End: 2018-01-04

## 2018-01-03 ENCOUNTER — Encounter
Admit: 2018-01-03 | Discharge: 2018-01-03 | Disposition: A | Discharge status: Home / Self Care | Payer: MEDICARE | Attending: Family Medicine

## 2018-01-03 ENCOUNTER — Emergency Department
Admit: 2018-01-03 | Discharge: 2018-01-03 | Disposition: A | Discharge status: Home / Self Care | Payer: MEDICARE | Attending: Family Medicine

## 2018-01-03 DIAGNOSIS — R51 Headache: Principal | ICD-10-CM

## 2018-01-04 MED ORDER — ISOSORBIDE MONONITRATE ER 30 MG TABLET,EXTENDED RELEASE 24 HR
ORAL_TABLET | Freq: Every day | ORAL | 3 refills | 0.00000 days | Status: CP
Start: 2018-01-04 — End: ?

## 2018-01-04 MED ORDER — TORSEMIDE 20 MG TABLET
ORAL_TABLET | 1 refills | 0 days | Status: CP
Start: 2018-01-04 — End: 2018-03-30

## 2018-02-19 DIAGNOSIS — G8929 Other chronic pain: Secondary | ICD-10-CM | POA: Diagnosis not present

## 2018-02-19 DIAGNOSIS — Z5181 Encounter for therapeutic drug level monitoring: Secondary | ICD-10-CM | POA: Diagnosis not present

## 2018-02-19 DIAGNOSIS — M25561 Pain in right knee: Secondary | ICD-10-CM | POA: Diagnosis not present

## 2018-02-19 DIAGNOSIS — D649 Anemia, unspecified: Secondary | ICD-10-CM | POA: Diagnosis not present

## 2018-02-19 DIAGNOSIS — R197 Diarrhea, unspecified: Secondary | ICD-10-CM | POA: Diagnosis not present

## 2018-02-19 DIAGNOSIS — M545 Low back pain: Secondary | ICD-10-CM | POA: Diagnosis not present

## 2018-03-01 DIAGNOSIS — Z1211 Encounter for screening for malignant neoplasm of colon: Secondary | ICD-10-CM | POA: Diagnosis not present

## 2018-03-03 DIAGNOSIS — J449 Chronic obstructive pulmonary disease, unspecified: Secondary | ICD-10-CM | POA: Diagnosis not present

## 2018-03-12 DIAGNOSIS — R197 Diarrhea, unspecified: Secondary | ICD-10-CM | POA: Diagnosis not present

## 2018-03-17 DIAGNOSIS — I10 Essential (primary) hypertension: Secondary | ICD-10-CM | POA: Diagnosis not present

## 2018-03-22 DIAGNOSIS — G8929 Other chronic pain: Secondary | ICD-10-CM | POA: Diagnosis not present

## 2018-03-22 DIAGNOSIS — M25561 Pain in right knee: Secondary | ICD-10-CM | POA: Diagnosis not present

## 2018-03-22 DIAGNOSIS — E782 Mixed hyperlipidemia: Secondary | ICD-10-CM | POA: Diagnosis not present

## 2018-03-22 DIAGNOSIS — E1169 Type 2 diabetes mellitus with other specified complication: Secondary | ICD-10-CM | POA: Diagnosis not present

## 2018-03-22 DIAGNOSIS — M545 Low back pain: Secondary | ICD-10-CM | POA: Diagnosis not present

## 2018-03-22 DIAGNOSIS — Z5181 Encounter for therapeutic drug level monitoring: Secondary | ICD-10-CM | POA: Diagnosis not present

## 2018-03-30 MED ORDER — TORSEMIDE 20 MG TABLET
2 refills | 0 days | Status: CP
Start: 2018-03-30 — End: ?

## 2018-04-03 DIAGNOSIS — J449 Chronic obstructive pulmonary disease, unspecified: Secondary | ICD-10-CM | POA: Diagnosis not present

## 2018-04-17 DIAGNOSIS — I10 Essential (primary) hypertension: Secondary | ICD-10-CM | POA: Diagnosis not present

## 2018-04-20 DIAGNOSIS — E1169 Type 2 diabetes mellitus with other specified complication: Secondary | ICD-10-CM | POA: Diagnosis not present

## 2018-04-20 DIAGNOSIS — Z1211 Encounter for screening for malignant neoplasm of colon: Secondary | ICD-10-CM | POA: Diagnosis not present

## 2018-04-20 DIAGNOSIS — Z5181 Encounter for therapeutic drug level monitoring: Secondary | ICD-10-CM | POA: Diagnosis not present

## 2018-04-20 DIAGNOSIS — M545 Low back pain: Secondary | ICD-10-CM | POA: Diagnosis not present

## 2018-04-20 DIAGNOSIS — M25561 Pain in right knee: Secondary | ICD-10-CM | POA: Diagnosis not present

## 2018-04-20 DIAGNOSIS — D649 Anemia, unspecified: Secondary | ICD-10-CM | POA: Diagnosis not present

## 2018-04-20 DIAGNOSIS — E782 Mixed hyperlipidemia: Secondary | ICD-10-CM | POA: Diagnosis not present

## 2018-04-26 DIAGNOSIS — J9612 Chronic respiratory failure with hypercapnia: Secondary | ICD-10-CM | POA: Diagnosis not present

## 2018-05-02 DIAGNOSIS — J449 Chronic obstructive pulmonary disease, unspecified: Secondary | ICD-10-CM | POA: Diagnosis not present

## 2018-05-16 DIAGNOSIS — I10 Essential (primary) hypertension: Secondary | ICD-10-CM | POA: Diagnosis not present

## 2018-05-27 DIAGNOSIS — J9612 Chronic respiratory failure with hypercapnia: Secondary | ICD-10-CM | POA: Diagnosis not present

## 2018-06-02 DIAGNOSIS — J449 Chronic obstructive pulmonary disease, unspecified: Secondary | ICD-10-CM | POA: Diagnosis not present

## 2018-06-16 DIAGNOSIS — I10 Essential (primary) hypertension: Secondary | ICD-10-CM | POA: Diagnosis not present

## 2018-06-21 ENCOUNTER — Other Ambulatory Visit: Payer: Self-pay

## 2018-06-21 DIAGNOSIS — M545 Low back pain: Secondary | ICD-10-CM | POA: Diagnosis not present

## 2018-06-21 DIAGNOSIS — J309 Allergic rhinitis, unspecified: Secondary | ICD-10-CM | POA: Diagnosis not present

## 2018-06-21 DIAGNOSIS — E782 Mixed hyperlipidemia: Secondary | ICD-10-CM | POA: Diagnosis not present

## 2018-06-21 DIAGNOSIS — E1169 Type 2 diabetes mellitus with other specified complication: Secondary | ICD-10-CM | POA: Diagnosis not present

## 2018-06-21 DIAGNOSIS — M25561 Pain in right knee: Secondary | ICD-10-CM | POA: Diagnosis not present

## 2018-06-21 NOTE — Patient Outreach (Signed)
[facility]) Care Management  06/21/2018  LIGE BLANCHETTE Nov 11, 1955 [id number]   Medication Adherence call to Mr. Raife Lorenc HIPPA Compliant Voice message left with a call back number. Mr. Pierre is showing past due on Rosuvastatin 40 mg Lisinopril 10 mg and metformin 1000 mg under [employer].   Lillia Abed CPhT Pharmacy Technician [facility] Care Management Direct Dial [phone]  Fax [phone] [email or web address]

## 2018-06-26 DIAGNOSIS — J9612 Chronic respiratory failure with hypercapnia: Secondary | ICD-10-CM | POA: Diagnosis not present

## 2018-07-02 DIAGNOSIS — J449 Chronic obstructive pulmonary disease, unspecified: Secondary | ICD-10-CM | POA: Diagnosis not present

## 2018-07-16 DIAGNOSIS — I10 Essential (primary) hypertension: Secondary | ICD-10-CM | POA: Diagnosis not present

## 2018-07-21 DIAGNOSIS — Z5181 Encounter for therapeutic drug level monitoring: Secondary | ICD-10-CM | POA: Diagnosis not present

## 2018-07-21 DIAGNOSIS — Z79899 Other long term (current) drug therapy: Secondary | ICD-10-CM | POA: Diagnosis not present

## 2018-07-21 DIAGNOSIS — G894 Chronic pain syndrome: Secondary | ICD-10-CM | POA: Diagnosis not present

## 2018-07-27 DIAGNOSIS — J9612 Chronic respiratory failure with hypercapnia: Secondary | ICD-10-CM | POA: Diagnosis not present

## 2018-08-02 DIAGNOSIS — J449 Chronic obstructive pulmonary disease, unspecified: Secondary | ICD-10-CM | POA: Diagnosis not present

## 2018-08-09 ENCOUNTER — Other Ambulatory Visit: Payer: Self-pay

## 2018-08-09 NOTE — Patient Outreach (Signed)
[facility]) Care Management  08/09/2018  HUGH KAMARA [birth year] [id number]   Medication Adherence call to Mr. Saban Heinlen HIPPA Compliant Voice message left with a call back number. Mr. [place] is showing past due on Rosuvastatin 40 mg under [place].   [place] Management Direct Dial [phone]  Fax [phone] [email or web address]

## 2018-08-16 DIAGNOSIS — I10 Essential (primary) hypertension: Secondary | ICD-10-CM | POA: Diagnosis not present

## 2018-08-18 DIAGNOSIS — E1169 Type 2 diabetes mellitus with other specified complication: Secondary | ICD-10-CM | POA: Diagnosis not present

## 2018-08-18 DIAGNOSIS — M25561 Pain in right knee: Secondary | ICD-10-CM | POA: Diagnosis not present

## 2018-08-18 DIAGNOSIS — G8929 Other chronic pain: Secondary | ICD-10-CM | POA: Diagnosis not present

## 2018-08-18 DIAGNOSIS — Z5181 Encounter for therapeutic drug level monitoring: Secondary | ICD-10-CM | POA: Diagnosis not present

## 2018-08-18 DIAGNOSIS — M546 Pain in thoracic spine: Secondary | ICD-10-CM | POA: Diagnosis not present

## 2018-08-26 ENCOUNTER — Encounter: Admit: 2018-08-26 | Discharge: 2018-08-28 | Payer: MEDICARE

## 2018-08-26 ENCOUNTER — Ambulatory Visit: Admit: 2018-08-26 | Discharge: 2018-08-28 | Payer: MEDICARE

## 2018-08-26 DIAGNOSIS — R079 Chest pain, unspecified: Principal | ICD-10-CM

## 2018-08-26 DIAGNOSIS — K219 Gastro-esophageal reflux disease without esophagitis: Secondary | ICD-10-CM | POA: Diagnosis not present

## 2018-08-26 DIAGNOSIS — R002 Palpitations: Secondary | ICD-10-CM | POA: Diagnosis not present

## 2018-08-26 DIAGNOSIS — J9612 Chronic respiratory failure with hypercapnia: Secondary | ICD-10-CM | POA: Diagnosis not present

## 2018-08-26 DIAGNOSIS — T82855A Stenosis of coronary artery stent, initial encounter: Secondary | ICD-10-CM | POA: Diagnosis not present

## 2018-08-26 DIAGNOSIS — I5032 Chronic diastolic (congestive) heart failure: Secondary | ICD-10-CM | POA: Diagnosis not present

## 2018-08-26 DIAGNOSIS — I5030 Unspecified diastolic (congestive) heart failure: Secondary | ICD-10-CM | POA: Diagnosis not present

## 2018-08-26 DIAGNOSIS — Z955 Presence of coronary angioplasty implant and graft: Secondary | ICD-10-CM | POA: Diagnosis not present

## 2018-08-26 DIAGNOSIS — J449 Chronic obstructive pulmonary disease, unspecified: Secondary | ICD-10-CM | POA: Diagnosis not present

## 2018-08-26 DIAGNOSIS — Z7982 Long term (current) use of aspirin: Secondary | ICD-10-CM | POA: Diagnosis not present

## 2018-08-26 DIAGNOSIS — Z9181 History of falling: Secondary | ICD-10-CM | POA: Diagnosis not present

## 2018-08-26 DIAGNOSIS — E785 Hyperlipidemia, unspecified: Secondary | ICD-10-CM | POA: Diagnosis not present

## 2018-08-26 DIAGNOSIS — I110 Hypertensive heart disease with heart failure: Secondary | ICD-10-CM | POA: Diagnosis not present

## 2018-08-26 DIAGNOSIS — Z7951 Long term (current) use of inhaled steroids: Secondary | ICD-10-CM | POA: Diagnosis not present

## 2018-08-26 DIAGNOSIS — E7800 Pure hypercholesterolemia, unspecified: Secondary | ICD-10-CM | POA: Diagnosis not present

## 2018-08-26 DIAGNOSIS — R918 Other nonspecific abnormal finding of lung field: Secondary | ICD-10-CM | POA: Diagnosis not present

## 2018-08-26 DIAGNOSIS — K529 Noninfective gastroenteritis and colitis, unspecified: Secondary | ICD-10-CM | POA: Diagnosis not present

## 2018-08-26 DIAGNOSIS — R001 Bradycardia, unspecified: Secondary | ICD-10-CM | POA: Diagnosis not present

## 2018-08-26 DIAGNOSIS — E119 Type 2 diabetes mellitus without complications: Secondary | ICD-10-CM | POA: Diagnosis not present

## 2018-08-26 DIAGNOSIS — I2699 Other pulmonary embolism without acute cor pulmonale: Secondary | ICD-10-CM | POA: Diagnosis not present

## 2018-08-26 DIAGNOSIS — Z7902 Long term (current) use of antithrombotics/antiplatelets: Secondary | ICD-10-CM | POA: Diagnosis not present

## 2018-08-26 DIAGNOSIS — R2689 Other abnormalities of gait and mobility: Secondary | ICD-10-CM | POA: Diagnosis not present

## 2018-08-26 DIAGNOSIS — I25110 Atherosclerotic heart disease of native coronary artery with unstable angina pectoris: Secondary | ICD-10-CM | POA: Diagnosis not present

## 2018-08-26 DIAGNOSIS — I2510 Atherosclerotic heart disease of native coronary artery without angina pectoris: Secondary | ICD-10-CM | POA: Diagnosis not present

## 2018-08-26 DIAGNOSIS — Z885 Allergy status to narcotic agent status: Secondary | ICD-10-CM | POA: Diagnosis not present

## 2018-08-26 DIAGNOSIS — Z1159 Encounter for screening for other viral diseases: Secondary | ICD-10-CM | POA: Diagnosis not present

## 2018-08-26 NOTE — Unmapped (Signed)
Cardiology H&P      Attending: Dr. Jacquelyne Balint , MD   Primary Cardiologist: Dr. Leta Baptist, MD   Primary Care Physician: Eloisa Northern, MD       Assessment/Plan:     Principal Problem:    Chest pain  Active Problems:    Coronary artery disease involving native coronary artery of native heart    Hypertensive heart disease with heart failure (CMS-HCC)    Hyperlipidemia LDL goal <100 (RAF-HCC)    Morbid obesity (CMS-HCC)    Diabetes mellitus (CMS-HCC)    Obstructive sleep apnea    HTN (hypertension)    GERD (gastroesophageal reflux disease)    Dyslipidemia  Resolved Problems:    * No resolved hospital problems. *    Thomas Jettie Pagan Sr. is a 63 y.o. male with PMHx of HFpEF, HTN, HLD, CAD s/p 2 stents in 2011, COPD, OSA, DM and a history of ASD/PFO s/p closure who presented to [facility] with chest pain.    Chest Pain: EKG looks unchanged from prior without concern for ST changes, BBB, T-wave abnormalities. Trop negative x2 in ED but HEART Score is elevated as below. Do have concern about previus loss to follow up and no visits with Pulm/Cards in 1 year. His story has some concerning features for ischemic cardiac cause given his initial response to Nitro, concurrent nausea his extensive known CAD s/p stents with stenosis seen a year ago (see below). He has also had HFpEF with fluid overload/sympotmatic (likely little reserve given his lung disease) presentations in the past despite low BNP (today just 160) and he reports he is up 4 pounds since yesterday, so will do trial of diuresis. PE is also of concern as he has had one in the past. DAPT, but no anticoag. Wells PE is 3 so will evaluate with D-dimer. Needed morphine x2 but now comfortable. Of course GERD is a possibility as well.   - Rapid COVID given dry cough  - D-dimer, history of PE, Wells PE score is 3 (3-16% chance of PE)   - Monitor on telemetry  - Given Aspirin 325 mg x1 with EMS, then 81 mg in AM and daily  - Home Plavix will be continued  - Trop negative x2 in ED, will repeat third at noon today  - If chest pain returns, repeat Troponin I and ECG  - Will provide sublingual nitro as needed for chest pain  - Risk stratify further with HgbA1c and Lipids  - Oxy/morphine PRN  - Consider repeat Cath   - Tele    HEART SCORE:  History Highly Suspicious (2)  EKG Normal (0)  Age 85 - 80 (1)  Risk Factors >= 3 or History of Atherosclerotic Disease (2)  Troponin <= normal limit (0)  TOTAL: 5  4-6: 12-16.6% risk of major adverse cardiac event in 6 weeks (MACE: Major Adverse Cardiac Events defined as: all-cause mortality, myocardial infarction, or coronary revascularization)    ZOX:WRUE cath was 07/2017 which showed large, ectatic coronary arteries with moderate diffuse coronary artery disease including 50% mid LAD instent stenosis, 70% ostial D2 stenosis, and 50% mid LCx stenosis w/o intervention at that time. Saw Cardiology last in 08/2017 and has been lost to follow up since then (multiple deaths in his family that have created strain on him).   - ASA 81mg  daily  - Cont home Plavix  - Atorvastatin 80mg     HFpEF: Last Echo was 07/2017 showed 55060% EF with grade 2 diastolic dysfunction, moderate LVH  and mild valvular disease. In the past, Dr. Graciela Husbands had felt that much of his SOB/symptomatic HF had been due to his pulm disease.   - Lasix 40mg  IV now and reassess for additional needs  - Daily Torsemide 40mg   - Repeat Echo w/bubble as he has history of PFO s/p closure  - Home Metop/Lisinopril, not in apparent HF exacerbation    COPD: Home O2 of 5L (on home O2 since 2017). Last visit was w/Dr. Jethro Bolus in 07/2017. No PFTs on record but reported as being very severe per patient report from Pulmonologist in [place]. A1AT test was MM (normal and scanned in to media) but cannot find a pulm report for PFTs or other explanation for COPD as he has not had any extensive smoking history. He thinks some of this is related to occupational exposures due to [employer], [employer].   - Will re-start home inhalers, unclear why he stopped, he was unsure  - Home BiPAP  -  Albuterol prn  - Goal spO2 90    DM2: Last A1c was in 07/2017 and was 10.7. Has not been in care for 1 yr  - Home NPH 10U BID (reports taking 10-15)  - SSI  - Holding home metformin 1g BID for now  - A1c  - UA and microalbumin/cr while here and getting urine anyway  - Will need PCP f/u    HTN: Home Imdur 90mg  daily, Lisinopril 10mg  daily, Metop 100mg  BID,   OSA and obesity hypoventilation: Home BiPAP  GERD: home PPI  HLD: Statin as above, AM lipid panel   Depression: Home Bupropion 300mg  qhs, Sertaline 100mg  daily  BPH: Home flomax    FEN/GI  - Heart healthy diet  - Replete Electrolytes; Mg to 2.0 and K to 4.0     PPX  - DVT: Enoxaparin    Code Status: do not resuscitate    Dispo: Admit to Cardiology- Status: Obs    Contact Information:  Emergency Contact: Gatlin,Kim  Address: [address]           [place], [place] [address] [place]  Mobile Phone: [phone]  Relation: Daughter-in-law  Preferred language: ENGLISH  Interpreter needed? No  -----------------------------------------------------------------------------    HPI   Thomas Jettie Pagan Sr. is a 63 y.o. male who presents for evaluation of chest pain and palpitations. He noticed retrosternal CP and palpitations described as 8/10 sharp to burning starting yesterday afternoon ~5pm. Responded to 1x SL nitro within 10-15 min. Not brought on by exertion as he was sitting in his chair at the time. No dizziness or diaphoresis, but did have nausea at the time, also resolved with nitro. Went to bed without issue, on his home BiPAP for his COPD and OSA and then woke up acutely at 0300 form the same central substernal CP that was sharp to burning again. He denied radiation to me but reported L arm radiation to ED doctor. Nausea also returned. Tried nitro x 2 aobut 10-15 min apart without effect. His daughter in law called EMS and they told her to give ASA 81x4 and he chewed these, got morphine with EMS and again in ED. Felling better now, pain now 4/10. Reports yesterday he was up 4 pounds from day prior (317 form his dry wt ~313. Is taking his torsemide, but did not change in response to this wt change. No emesis or retching. Chronic diarrhea persists. No dysuria but does have weak stream, but does make urine in response to diuretic. Can  walk about 20-30 feet before sig dyspnea occurs. Does not have any new or progressive LE edema. Developed dry cough yesterday afternoon. No known sick contacts or COVID exposures and no fever.     Has had issues with GERD In past and on PPI, reports compliance. No belching, large meal or triggers like EtOH, spicy foods etc yesterday.       Allergies:  Codeine    Medications:   No current facility-administered medications for this encounter.      Current Outpatient Medications   Medication Sig Dispense Refill   ??? donepeziL (ARICEPT) 10 MG tablet Take 20 mg by mouth nightly.     ??? albuterol 2.5 mg /3 mL (0.083 %) nebulizer solution Inhale 2.5 mg by nebulization every six (6) hours as needed for wheezing.     ??? albuterol HFA 90 mcg/actuation inhaler Inhale 2 puffs every six (6) hours as needed for wheezing. 1 Inhaler 11   ??? aspirin (ECOTRIN) 81 MG tablet Take 81 mg by mouth daily.     ??? BD VEO INSULIN SYRINGE UF 1/2 mL 31 gauge x 15/64 Syrg      ??? bethanechol (URECHOLINE) 10 MG tablet Take 10 mg by mouth Two (2) times a day.      ??? budesonide-formoterol (SYMBICORT) 160-4.5 mcg/actuation inhaler Inhale 2 puffs Two (2) times a day.     ??? buPROPion (WELLBUTRIN SR) 150 MG 12 hr tablet Take 150 mg by mouth Two (2) times a day.     ??? cholecalciferol, vitamin D3, (VITAMIN D3) 1,000 unit capsule Take 1,000 Units by mouth daily.     ??? clopidogrel (PLAVIX) 75 mg tablet Take 75 mg by mouth daily.     ??? diphenhydrAMINE (BENADRYL) 25 mg capsule/tablet Take 25 mg by mouth every six (6) hours as needed for itching.     ??? gabapentin (NEURONTIN) 300 MG capsule 1 tablet(300 mg total) by mouth in the morning, 1 tablet(300 mg total) by mouth at noon, 2 tablets(600mg  total) by mouth at bedtime. (Patient taking differently: 400 mg. 1 tablet(300 mg total) by mouth in the morning, 1 tablet(300 mg total) by mouth at noon, 2 tablets(600mg  total) by mouth at bedtime.) 120 capsule 0   ??? insulin NPH (HUMULIN,NOVOLIN) 100 unit/mL injection Inject 30 Units under the skin Two (2) times a day.      ??? isosorbide mononitrate (IMDUR) 30 MG 24 hr tablet Take 3 tablets (90 mg total) by mouth daily. 90 tablet 3   ??? lisinopril (PRINIVIL,ZESTRIL) 10 MG tablet Take 10 mg by mouth daily.     ??? loperamide (IMODIUM) 2 mg capsule Take 2 mg by mouth 4 (four) times a day as needed for diarrhea.     ??? metFORMIN (GLUCOPHAGE) 1000 MG tablet Take 1,000 mg by mouth 2 (two) times a day with meals.      ??? metoprolol tartrate (LOPRESSOR) 100 MG tablet Take 100 mg by mouth Two (2) times a day.     ??? nitroglycerin (NITROSTAT) 0.4 MG SL tablet Place 1 tablet (0.4 mg total) under the tongue every five (5) minutes as needed for chest pain. 25 tablet 3   ??? oxyCODONE-acetaminophen (PERCOCET) 10-325 mg per tablet Take 1 tablet by mouth every eight (8) hours as needed for pain.     ??? pantoprazole (PROTONIX) 40 MG tablet Take 40 mg by mouth daily.     ??? rosuvastatin (CRESTOR) 40 MG tablet Take 40 mg by mouth daily.     ??? sertraline (ZOLOFT)  100 MG tablet Take 100 mg by mouth daily.     ??? tamsulosin (FLOMAX) 0.4 mg capsule Take 0.8 mg by mouth nightly.      ??? tiotropium (SPIRIVA) 18 mcg inhalation capsule Place 1 capsule (18 mcg total) into inhaler and inhale daily. (Patient not taking: Reported on 01/03/2018) 30 capsule 11   ??? torsemide (DEMADEX) 20 MG tablet TAKE 2 TABLETS BY MOUTH ON MONDAY, WEDNESDAY AND FRIDAY AND 3 TABLETS ON ALL OTHER DAYS 48 each 2       Medical History:  Past Medical History:   Diagnosis Date   ??? CAD (coronary artery disease)    ??? CHF (congestive heart failure) (CMS-HCC)     unknown type   ??? Depression    ??? Diabetes mellitus (CMS-HCC)    ??? H/O chest pain    ??? Hyperlipidemia    ??? Hypertension    ??? Hypoxemia    ??? Obesity    ??? OSA (obstructive sleep apnea)    ??? Pulmonary embolism (CMS-HCC)        Surgical History:  Past Surgical History:   Procedure Laterality Date   ??? BACK SURGERY     ??? CARDIAC CATHETERIZATION     ??? CORONARY STENT PLACEMENT     ??? HERNIA REPAIR     ??? PATENT FORAMEN OVALE CLOSURE      closure at [address] Bernette [facility]    ??? PR CATH PLACE/CORON ANGIO, IMG SUPER/INTERP,W LEFT HEART VENTRICULOGRAPHY N/A 07/29/2017    Procedure: Left Heart Catheterization;  Surgeon: Alvira Philips, MD;  Location: [facility] CATH;  Service: Cardiology       Social History:  Social History     Tobacco Use   ??? Smoking status: Never Smoker   ??? Smokeless tobacco: Current User     Types: Snuff   ??? Tobacco comment: Has dipped for 45 years   Substance Use Topics   ??? Alcohol use: Not Currently     Alcohol/week: 0.0 standard drinks     Comment: Rare       Family History:  Family History   Problem Relation Age of Onset   ??? Heart attack Mother    ??? Heart disease Mother    ??? Hypertension Mother    ??? Heart failure Mother    ??? Heart disease Father    ??? Heart attack Father    ??? Hypertension Father    ??? Heart failure Father    ??? Dementia Father    ??? Lung disease Brother        Review of Systems:  A 12 system review of systems was negative except as noted in HPI.    Objective:      Patient Vitals for the past 8 hrs:   BP Temp Temp src Pulse SpO2 Pulse Resp SpO2   08/26/18 1003 131/91 ??? ??? 62 ??? 18 97 %   08/26/18 0558 ??? 36.6 ??C Oral ??? ??? ??? ???   08/26/18 0544 157/70 ??? ??? 60 61 25 100 %     No intake/output data recorded.    Physical Exam  VITAL SIGNS:  Wt Readings from Last 3 Encounters:   01/03/18 (!) 148.3 kg (327 lb)   09/03/17 (!) 151.9 kg (334 lb 14.4 oz)   09/01/17 (!) 152 kg (335 lb)     Temp Readings from Last 3 Encounters:   08/26/18 36.6 ??C (Oral)   01/03/18 36.8 ??C   08/26/17 37.2 ??C (Temporal)  BP Readings from Last 3 Encounters:   08/26/18 131/91   01/03/18 124/60   09/03/17 138/60     Pulse Readings from Last 3 Encounters:   08/26/18 62   01/03/18 59   09/03/17 81       Gen:  Obese male laying in ED bed. No acute distress and able to answer all questions.   HEENT:No evidence of trauma. PERRLA. EOM's intact. Sclera clear, conjunctiva pink without exudate. MMM. No thyromegaly or nodules. No JVD noted but neck is quite thick.  CV: Regular rate and rhythm but borderline bracycardic, no murmurs rubs or gallops  Pulm:  Normal effort. [place] in place with 5L. Bibasilar fine crackles, L>R. Poor air movement throughout.    Abd:  Obese but soft and non-tender without rebound or guarding and not distended with normal BS. No HSM. No aortic pulsations.  Ext:  Warm, trace bilateral LE edema to mid shin  Neuro: CNII-XII intact. Normal cerebellar function  Psych: Normal mood and affect but sad when discussing the loss of his daughter earlier this year      Labs: All labs and imaging studies were personally reviewed and are per the EHR.    Imaging:  Chest Xray: 8-9 ribs expanded with generously sized heart borders. No effusions or focal opacifications. Some streaky RLL opacities that could be infiltrate vs atelectasis. No B lines or fluid in the fissure.     ECG: NSR, rate 61.  Possibly borderline LAD. No definitive LVH from EKG. No ST changes. Normal P waves in II and V. Small S wave in lead 1 but no Q in lead 3 but there is flatting of T wave in lead 3. PR 204. Normal QRS and QTc

## 2018-08-27 DIAGNOSIS — Z7902 Long term (current) use of antithrombotics/antiplatelets: Secondary | ICD-10-CM | POA: Diagnosis not present

## 2018-08-27 DIAGNOSIS — E785 Hyperlipidemia, unspecified: Secondary | ICD-10-CM | POA: Diagnosis not present

## 2018-08-27 DIAGNOSIS — I110 Hypertensive heart disease with heart failure: Secondary | ICD-10-CM | POA: Diagnosis not present

## 2018-08-27 DIAGNOSIS — I5032 Chronic diastolic (congestive) heart failure: Secondary | ICD-10-CM | POA: Diagnosis not present

## 2018-08-27 DIAGNOSIS — Z955 Presence of coronary angioplasty implant and graft: Secondary | ICD-10-CM | POA: Diagnosis not present

## 2018-08-27 DIAGNOSIS — I25110 Atherosclerotic heart disease of native coronary artery with unstable angina pectoris: Secondary | ICD-10-CM | POA: Diagnosis not present

## 2018-08-27 DIAGNOSIS — Z1159 Encounter for screening for other viral diseases: Secondary | ICD-10-CM | POA: Diagnosis not present

## 2018-08-27 DIAGNOSIS — Z885 Allergy status to narcotic agent status: Secondary | ICD-10-CM | POA: Diagnosis not present

## 2018-08-27 DIAGNOSIS — K219 Gastro-esophageal reflux disease without esophagitis: Secondary | ICD-10-CM | POA: Diagnosis not present

## 2018-08-27 DIAGNOSIS — Z7951 Long term (current) use of inhaled steroids: Secondary | ICD-10-CM | POA: Diagnosis not present

## 2018-08-27 DIAGNOSIS — K529 Noninfective gastroenteritis and colitis, unspecified: Secondary | ICD-10-CM | POA: Diagnosis not present

## 2018-08-27 DIAGNOSIS — T82855A Stenosis of coronary artery stent, initial encounter: Secondary | ICD-10-CM | POA: Diagnosis not present

## 2018-08-27 DIAGNOSIS — J449 Chronic obstructive pulmonary disease, unspecified: Secondary | ICD-10-CM | POA: Diagnosis not present

## 2018-08-27 DIAGNOSIS — R002 Palpitations: Secondary | ICD-10-CM | POA: Diagnosis not present

## 2018-08-27 DIAGNOSIS — E7800 Pure hypercholesterolemia, unspecified: Secondary | ICD-10-CM | POA: Diagnosis not present

## 2018-08-27 DIAGNOSIS — R079 Chest pain, unspecified: Secondary | ICD-10-CM | POA: Diagnosis not present

## 2018-08-27 DIAGNOSIS — R2689 Other abnormalities of gait and mobility: Secondary | ICD-10-CM | POA: Diagnosis not present

## 2018-08-27 DIAGNOSIS — E119 Type 2 diabetes mellitus without complications: Secondary | ICD-10-CM | POA: Diagnosis not present

## 2018-08-27 DIAGNOSIS — Z7982 Long term (current) use of aspirin: Secondary | ICD-10-CM | POA: Diagnosis not present

## 2018-08-27 DIAGNOSIS — Z9181 History of falling: Secondary | ICD-10-CM | POA: Diagnosis not present

## 2018-08-27 LAB — BASIC METABOLIC PANEL
ANION GAP: 13 mmol/L (ref 7–15)
ANION GAP: 10 mmol/L (ref 7–15)
BLOOD UREA NITROGEN: 39 mg/dL — ABNORMAL HIGH (ref 7–21)
BLOOD UREA NITROGEN: 39 mg/dL — ABNORMAL HIGH (ref 7–21)
BUN / CREAT RATIO: 29
BUN / CREAT RATIO: 28
CALCIUM: 8.1 mg/dL — ABNORMAL LOW (ref 8.5–10.2)
CALCIUM: 8.2 mg/dL — ABNORMAL LOW (ref 8.5–10.2)
CHLORIDE: 99 mmol/L (ref 98–107)
CHLORIDE: 96 mmol/L — ABNORMAL LOW (ref 98–107)
CO2: 30 mmol/L (ref 22.0–30.0)
CREATININE: 1.36 mg/dL — ABNORMAL HIGH (ref 0.70–1.30)
CREATININE: 1.38 mg/dL — ABNORMAL HIGH (ref 0.70–1.30)
EGFR CKD-EPI AA MALE: 64 mL/min/{1.73_m2} (ref >=60)
EGFR CKD-EPI AA MALE: 63 mL/min/{1.73_m2} (ref >=60)
EGFR CKD-EPI NON-AA MALE: 56 mL/min/{1.73_m2} — ABNORMAL LOW (ref >=60)
EGFR CKD-EPI NON-AA MALE: 55 mL/min/{1.73_m2} — ABNORMAL LOW (ref >=60)
GLUCOSE RANDOM: 140 mg/dL (ref 70–179)
GLUCOSE RANDOM: 154 mg/dL (ref 70–179)
POTASSIUM: 4.3 mmol/L (ref 3.5–5.0)
POTASSIUM: 4.4 mmol/L (ref 3.5–5.0)
SODIUM: 142 mmol/L (ref 135–145)

## 2018-08-27 LAB — LIPID PANEL
CHOLESTEROL/HDL RATIO SCREEN: 10 — ABNORMAL HIGH (ref <5.0)
HDL CHOLESTEROL: 21 mg/dL — ABNORMAL LOW (ref 40–59)
NON-HDL CHOLESTEROL: 188 mg/dL

## 2018-08-27 LAB — FASTING

## 2018-08-27 LAB — SODIUM: Sodium:SCnc:Pt:Ser/Plas:Qn:: 136

## 2018-08-27 LAB — MAGNESIUM: Magnesium:MCnc:Pt:Ser/Plas:Qn:: 1.9

## 2018-08-27 LAB — VITAMIN D, TOTAL (25OH): Lab: 28.9

## 2018-08-27 LAB — LDL CHOLESTEROL DIRECT: Cholesterol.in LDL:MCnc:Pt:Ser/Plas:Qn:Direct assay: 146.2 — ABNORMAL HIGH

## 2018-08-27 LAB — EGFR CKD-EPI NON-AA MALE: Lab: 56 — ABNORMAL LOW

## 2018-08-27 LAB — LDL CHOLESTEROL, DIRECT: LDL CHOLESTEROL DIRECT: 146.2 mg/dL — ABNORMAL HIGH (ref 60.0–99.0)

## 2018-08-27 LAB — PHOSPHORUS: Phosphate:MCnc:Pt:Ser/Plas:Qn:: 4

## 2018-08-27 MED ORDER — ICOSAPENT ETHYL 1 GRAM CAPSULE
ORAL_CAPSULE | Freq: Two times a day (BID) | 0 refills | 0.00000 days
Start: 2018-08-27 — End: 2018-08-27

## 2018-08-27 MED ORDER — EMPAGLIFLOZIN 25 MG TABLET
ORAL_TABLET | Freq: Every day | 0 refills | 0.00000 days
Start: 2018-08-27 — End: 2018-08-27

## 2018-08-27 MED ORDER — EVOLOCUMAB 140 MG/ML SUBCUTANEOUS PEN INJECTOR
SUBCUTANEOUS | 1 refills | 56.00000 days | Status: CP
Start: 2018-08-27 — End: ?
  Filled 2018-09-01: qty 2, 28d supply, fill #0

## 2018-08-27 NOTE — Unmapped (Signed)
Per test claim for REPATHA at the [facility], patient needs Medication Assistance Program for Prior Authorization.

## 2018-08-27 NOTE — Unmapped (Addendum)
Thomas Owens is a 63 y.o. male with PMHx of HFpEF, HTN, HLD, CAD s/p 2 stents in 2011, COPD (on 5L home O2), OSA, PE (2017, not on [facility]), DM and a history of ASD/PFO s/p closure who presented to [facility] with CP. Hospital course by problem list below.     Chest Pain: Patient presented with one day history of retrosternal CP and palpitations associated with nausea and initially responsive to NTG. He was loaded with aspirin PTA. In the ED,  EKG was unchanged from prior without concern for ST changes, BBB, T-wave abnormalities. Trop negative x4. D-dimer obtained and minimally elevated but patient without tachycardia (though on BB) or worsening hypoxia. Bilateral LE venous duplexes obtained and without evidence of DVT. V/Q scan deferred given bibasilar opacities and CTA not pursued given unlikely diagnosis and anticipated contrast load from [facility]. Patient continued having chest pain while inpatient and given his known CAD and instent thrombosis, LHC was completed and showed minimal change from [facility] in 2019, no major blockages. After the LHC, even without changes, his chest pain was markedly improved. He was chest pain free for the remained of his hospitalization. There was no clear inciting etiology of his chest pain but felt most likely 2/2 to chronic CAD and poorly controlled COPD.     CAD - HLD - HTN: Continued on home DAPT, atorvastatin and anti-hypertensives. LHC showed 50% in-stent restenosis of mid LAD unchanged from prior. No other evidence of obstructive CAD. Lipid panel was obtained and LDL elevated at 146 with low HDL of 21. He was continued on atorvastatin 80 and Zetia 10mg  daily was started. He will follow-up with cardiology for potential PCSK9 initiation. His BP was well controlled throughout.     HFpEF: Patient with known G2DD. CXR w/o pulmonary edema and oxygen requirement at baseline on admission. Pro-BNP minimally elevated from baseline of 50's. Received one dose of IV lasix. Repeat echo showed EF of 55-60%, LVH, dilated LA, dilated ascending aorta. Resumed home regimen without any changes.     COPD: On 5L at home and on the same here. Unclear cause of COPD but severe per outside PFT report. Normal A1AT testing. Nightly BiPAP continued while inpatient. Following with [facility] but no visit since 07/2017. Had not used his inhalers in 7 month secondary to cost. Changed Symbicort to Advair and Spiriva to Incruse Ellipta at discharge in accordance with patient's formulary. Likely does not need 5L of oxygen but patient unwilling to try lower amounts at this time. Encouraged 6 minute walk test to eval further. Will encourage outpatient pulmonology follow-up.     T2DM: A1c obtained on admission 7.2 improved from 10.7 one year prior. Home metformin held in lieu of SSI. Continued on home NPH regimen. BG well controlled throughout. No changes to home regimen made on discharge.     AKI, most likely prerenal: Patient came in with creatinine of 0.95 and increased to 1.36, most likely due to diuresis. Improved with oral hydration. Needs follow-up BMP in one week.

## 2018-08-27 NOTE — Unmapped (Signed)
Problem: Adult Inpatient Plan of Care  Goal: Plan of Care Review  Outcome: Progressing  Goal: Patient-Specific Goal (Individualization)  Outcome: Progressing  Goal: Absence of Hospital-Acquired Illness or Injury  Outcome: Progressing  Goal: Optimal Comfort and Wellbeing  Outcome: Progressing  Goal: Readiness for Transition of Care  Outcome: Progressing  Goal: Rounds/Family Conference  Outcome: Progressing     Problem: Adjustment to Illness (Heart Failure)  Goal: Optimal Coping  Outcome: Progressing     Problem: Fluid Imbalance (Heart Failure)  Goal: Fluid Balance  Outcome: Progressing     Problem: Functional Ability Impaired (Heart Failure)  Goal: Optimal Functional Ability  Outcome: Progressing     Problem: Oral Intake Inadequate (Heart Failure)  Goal: Optimal Nutrition Intake  Outcome: Progressing     Problem: COPD Comorbidity  Goal: Maintenance of COPD Symptom Control  Outcome: Progressing     Problem: Diabetes Comorbidity  Goal: Blood Glucose Level Within Desired Range  Outcome: Progressing     Problem: Heart Failure Comorbidity  Goal: Maintenance of Heart Failure Symptom Control  Outcome: Progressing     Problem: Obstructive Sleep Apnea Risk or Actual (Comorbidity Management)  Goal: Unobstructed Breathing During Sleep  Outcome: Progressing   A&Ox4, denies any CP or SOB after came back from the LHC, SB to NSR on monitor, VSS. LHC puncture site on R radial is clean/dry/intact, Tegaderm dressing placed over there. Pt voided in urinal, and encourage him to increase the fluid intake, will continue to monitor.

## 2018-08-27 NOTE — Unmapped (Signed)
Cardiology - Thomas Owens 1 [facility]) Progress Note    Interval History:  NAEO. Patient had some chest pain yesterday. Patient slept well on BIPAP overnight. LHC completed today without intervention.     Assessment/Plan:  Thomas Owens Sr. is a 63 y.o. male with a PMHx of HFpEF, HTN, HLD, CAD s/p 2 stents in 2011, COPD, OSA, DM, and a history of ASD/PFO s/p closure that presented to [facility] with chest pain. LHC on 7/10 did not show any major blockages.     Principal Problem:    Chest pain  Active Problems:    Coronary artery disease involving native coronary artery of native heart    Hypertensive heart disease with heart failure (CMS-HCC)    Hyperlipidemia LDL goal <100 (RAF-HCC)    Morbid obesity (CMS-HCC)    Diabetes mellitus (CMS-HCC)    Obstructive sleep apnea    HTN (hypertension)    GERD (gastroesophageal reflux disease)    Dyslipidemia  Resolved Problems:    * No resolved hospital problems. *    Chest Pain: EKG unremarkable and unchanged during episode of chest pain yesterday. Troponins negative x4. History concerning for ACS. D-dimer obtained and minimally elevated but patient without tachycardia (though on BB) or worsening hypoxia. Bilateral LE venous duplexes without evidence of DVT. LHC 7/10 did not show significant changes from 2019 - no significant blockages.   - Nitrolycerin PRN   - TTE pending    CAD: LHC 08/2017: Large, ectatic coronary arteries with moderate diffuse coronary artery disease including 50% mid LAD instent stenosis, 70% ostial D2 stenosis, and 50% mid LCx stenosis. Had two stents placed in 2011.   - Aspirin 81mg  daily  - Atorvastatin 80mg  daily  - Clopidogrel 75mg  daily  - Metoprolol Tartrate 100mg  BID  - Could benefit from CCB for anti-anginal properties, however, pressures currently well controlled.     AKI, most likely prerenal etiology: Creatinine 0.95 on admission, now 1.36, most likely from diuresis. Will monitor closely in the setting of contrast load for LHC.   - Encourage hydration today   - Trend Cr  - Likely discharge tomorrow.     COPD: On 5L at home and on the same here. Unclear cause of COPD but severe per outside PFT report.   - Fluticasone  - Umeclidinium  - Home BIPAP at night  - 5L O2    T2DM: A1c obtained on admission 7.2 improved from 10.7 one year prior. Home metformin held in lieu of SSI. Patient has peripheral neuropathy.  - Insulin NPH BID  - SSI    HFpEF: TTE 2019 showed G2DD with EF 55-60%. CXR w/o pulmonary edema and oxygen requirement at baseline on admission. No signs or symptoms of volume overload. Resume home regimen.  - Torsemide PO, 40mg  MWF, 60mg  TTSS  - Repeat TTE   - BP control as above     HTN:  BP mostly well controlled overnight with some noted lower pressures. Much improved from 157/70 noted on admission. Will need to optimize in setting of HFpEF   - Isosorbide mononitrate 90mg  daily  - Lisinopril 10   - Metoprolol as above    BPH: Patient reports getting prostate surgery and says symptoms are improved, but still present.   - Bethanechol 10mg  BID  - Tamsulosin 0.8mg  daily    Depression: Has had depression since 2018 due to father, brother, and daughter passing away in the last two years. No suicidal ideation.    - Bupropion 300mg  daily  - Sertraline  100mg  daily    Dementia: Was told his over the last year that his brain imaging revealed some signs of dementia. On Donepezil 20mg  @ home which has been reduced given limited evidence for benefit  - Donepezil 10mg  daily    Back Pain: Has had back pain since 2002 that has gradually worsened. Was told he has degenerative disk disease.  - Gabapentin 600mg     Hypocalcemia: Calcium 6.9 on admission and corrected calcium low as well. However, repeat  Calcium and ionized was WNL. Phosphorous WNL. PTH appropriately elevated. Asymptomatic.  - Check vitamin D     HLD:  Cholesterol and LDL both elevated. HDL low. Already on high intensity statin.   - Atorvastatin 80mg  daily   - Start Zetia 10   - Consider PCSK 9     Daily Checklist:  Diet: NPO  DVT PPx: Lovenox 40mg  q24h   GI PPx: PPI - pantoprazole 40mg  daily  Code Status: DNR and DNI  Dispo: Pending LHC, discharge tomorrow pending stable creatinine   ___________________________________________________________________    Objective:  Temp:  [35.4 ??C (95.7 ??F)-36.9 ??C (98.4 ??F)] 35.4 ??C (95.7 ??F)  Heart Rate:  [52-87] 52  SpO2 Pulse:  [57] 57  Resp:  [18-20] 19  BP: (94-151)/(55-91) 94/55  FiO2 (%):  [40 %] 40 %  SpO2:  [85 %-100 %] 85 %    General: Alert, NAD  CV: normal rate, regular rhythm  Lungs: normal WOB  Abd: soft, NT, ND  Extremities: Warm, no pitting edema  Neuro: No sensation below calves bilaterally     Labs/Studies:  Labs and Studies from the last 24hrs per EMR and Reviewed    Doy Mince [facility] MS3    I attest that I have reviewed the student note and that the components of the history of the present illness, the physical exam, and the assessment and plan documented were performed by me or were performed in my presence by the student where I verified the documentation and performed (or re-performed) the exam and medical decision making.    Joyice Faster MD, PharmD   [facility] PGY-2 Internal Medicine

## 2018-08-27 NOTE — Unmapped (Signed)
Order was placed for PIV by Venous Access Team (VAT).  Patient was assessed for placement of a PIV. Access was obtained. Blood return noted.  Dressing intact and device well secured.  Flushed with normal saline.  Pt advised to inform RN of any s/s of discomfort at the PIV site.    Workup / Procedure Time:  15 minutes      The primary RN was notified.       Thank you,     Cristal Deer RN Venous Access Team

## 2018-08-27 NOTE — Unmapped (Signed)
Pt has been A&Ox4, complained of mild chest pain pt stated that nitro sublingual was ineffective this afternoon, gave oxycodone pt stated pain relieved. O2 states dropped down to the lower 80s however mask was not properly on patient's face. Stuck pt twice and was unable to provide a PIV. Will page Venus access team in the morning in need of a IV ultra sound.   VSS, pt voiding in urine, srict I&O, NPO since MN will continue to monitor.   Problem: Adult Inpatient Plan of Care  Goal: Plan of Care Review  Outcome: Progressing  Goal: Patient-Specific Goal (Individualization)  Outcome: Progressing  Goal: Absence of Hospital-Acquired Illness or Injury  Outcome: Progressing  Goal: Optimal Comfort and Wellbeing  Outcome: Progressing  Goal: Readiness for Transition of Care  Outcome: Progressing  Goal: Rounds/Family Conference  Outcome: Progressing     Problem: Adjustment to Illness (Heart Failure)  Goal: Optimal Coping  Outcome: Progressing     Problem: Fluid Imbalance (Heart Failure)  Goal: Fluid Balance  Outcome: Progressing     Problem: Functional Ability Impaired (Heart Failure)  Goal: Optimal Functional Ability  Outcome: Progressing     Problem: Oral Intake Inadequate (Heart Failure)  Goal: Optimal Nutrition Intake  Outcome: Progressing     Problem: COPD Comorbidity  Goal: Maintenance of COPD Symptom Control  Outcome: Progressing     Problem: Diabetes Comorbidity  Goal: Blood Glucose Level Within Desired Range  Outcome: Progressing     Problem: Heart Failure Comorbidity  Goal: Maintenance of Heart Failure Symptom Control  Outcome: Progressing

## 2018-08-27 NOTE — Unmapped (Signed)
Cardiac Catheterization Laboratory  [place], [place]  Tel: [phone]     Fax: [phone]       HISTORY & PHYSICAL ASSESSMENT    PCP:  Eloisa Northern, MD  Phone:  [phone]  Fax:  [phone]    Referring Physicians:  Referred Self  No address on file     Primary Cardiologist:  Angelita Ingles, MD    History:    [phone] h/o CAD s/p PCI mid-LAD 2011, last cath 07/2017 demonstrated 50% ISR presenting for LHC with ongoing chest pain concerning for angina. Trop negative x2    hypertension  hyperlipidemia  diabetes mellitus    Never Smoked    Prior PCI; date 2011    No known history of prior MI.    No known history of prior CABG.    No known heart failure.     No cardiac arrest surrounding this admission.      Assessments:    ECG :  normal     Stress Test : No stress test performed    No new antiarrhythmic therapy initiated prior to cath lab.    No cardiac CTA performed    Prior angiogram WITHOUT intervention demonstrated non-obstructive CAD on the date of 07/2017    No record of EF within 6 months.    No Agatston coronary calcium score was assessed.    CSHA Clinical Frailty Scale : 3 - Managing Well    Chest Pain Assessment : typical angina     Cardiovascular Instability : persistent ischemic symptoms    Medications Administered : (pre-procedure)  Aspirin    Medications Contraindicated :   None documented    The patient's estimated bleeding risk is 1.0%.    Strategies used to mitigate risk include:RRA access

## 2018-08-27 NOTE — Unmapped (Signed)
Problem: Obstructive Sleep Apnea Risk or Actual (Comorbidity Management)  Goal: Unobstructed Breathing During Sleep  Outcome: Ongoing - Unchanged  Note: Patient wears BiPAP at home and  5 L /min nasal cannula.  Patient did not know home settings.  RT placed patient on BiPAP settings 17/8 and patient felt comfortable with pressures.  Patient wore BiPAP all night and came off this morning.  RT will continue monitor.

## 2018-08-28 DIAGNOSIS — R079 Chest pain, unspecified: Principal | ICD-10-CM

## 2018-08-28 DIAGNOSIS — Z885 Allergy status to narcotic agent status: Secondary | ICD-10-CM | POA: Diagnosis not present

## 2018-08-28 DIAGNOSIS — I25110 Atherosclerotic heart disease of native coronary artery with unstable angina pectoris: Secondary | ICD-10-CM | POA: Diagnosis not present

## 2018-08-28 DIAGNOSIS — K219 Gastro-esophageal reflux disease without esophagitis: Secondary | ICD-10-CM | POA: Diagnosis not present

## 2018-08-28 DIAGNOSIS — Z7902 Long term (current) use of antithrombotics/antiplatelets: Secondary | ICD-10-CM | POA: Diagnosis not present

## 2018-08-28 DIAGNOSIS — E119 Type 2 diabetes mellitus without complications: Secondary | ICD-10-CM | POA: Diagnosis not present

## 2018-08-28 DIAGNOSIS — E785 Hyperlipidemia, unspecified: Secondary | ICD-10-CM | POA: Diagnosis not present

## 2018-08-28 DIAGNOSIS — Z7982 Long term (current) use of aspirin: Secondary | ICD-10-CM | POA: Diagnosis not present

## 2018-08-28 DIAGNOSIS — I5032 Chronic diastolic (congestive) heart failure: Secondary | ICD-10-CM | POA: Diagnosis not present

## 2018-08-28 DIAGNOSIS — E7800 Pure hypercholesterolemia, unspecified: Secondary | ICD-10-CM | POA: Diagnosis not present

## 2018-08-28 DIAGNOSIS — Z9181 History of falling: Secondary | ICD-10-CM | POA: Diagnosis not present

## 2018-08-28 DIAGNOSIS — T82855A Stenosis of coronary artery stent, initial encounter: Secondary | ICD-10-CM | POA: Diagnosis not present

## 2018-08-28 DIAGNOSIS — K529 Noninfective gastroenteritis and colitis, unspecified: Secondary | ICD-10-CM | POA: Diagnosis not present

## 2018-08-28 DIAGNOSIS — I10 Essential (primary) hypertension: Secondary | ICD-10-CM | POA: Diagnosis not present

## 2018-08-28 DIAGNOSIS — N179 Acute kidney failure, unspecified: Secondary | ICD-10-CM | POA: Diagnosis not present

## 2018-08-28 DIAGNOSIS — Z7951 Long term (current) use of inhaled steroids: Secondary | ICD-10-CM | POA: Diagnosis not present

## 2018-08-28 DIAGNOSIS — J449 Chronic obstructive pulmonary disease, unspecified: Secondary | ICD-10-CM | POA: Diagnosis not present

## 2018-08-28 DIAGNOSIS — R2689 Other abnormalities of gait and mobility: Secondary | ICD-10-CM | POA: Diagnosis not present

## 2018-08-28 DIAGNOSIS — Z955 Presence of coronary angioplasty implant and graft: Secondary | ICD-10-CM | POA: Diagnosis not present

## 2018-08-28 DIAGNOSIS — R002 Palpitations: Secondary | ICD-10-CM | POA: Diagnosis not present

## 2018-08-28 DIAGNOSIS — I517 Cardiomegaly: Secondary | ICD-10-CM | POA: Diagnosis not present

## 2018-08-28 DIAGNOSIS — I110 Hypertensive heart disease with heart failure: Secondary | ICD-10-CM | POA: Diagnosis not present

## 2018-08-28 DIAGNOSIS — I77819 Aortic ectasia, unspecified site: Secondary | ICD-10-CM | POA: Diagnosis not present

## 2018-08-28 DIAGNOSIS — I2510 Atherosclerotic heart disease of native coronary artery without angina pectoris: Secondary | ICD-10-CM | POA: Diagnosis not present

## 2018-08-28 DIAGNOSIS — Z1159 Encounter for screening for other viral diseases: Secondary | ICD-10-CM | POA: Diagnosis not present

## 2018-08-28 LAB — BASIC METABOLIC PANEL
ANION GAP: 8 mmol/L (ref 7–15)
BLOOD UREA NITROGEN: 34 mg/dL — ABNORMAL HIGH (ref 7–21)
BUN / CREAT RATIO: 28
CALCIUM: 8.7 mg/dL (ref 8.5–10.2)
CHLORIDE: 100 mmol/L (ref 98–107)
CO2: 31 mmol/L — ABNORMAL HIGH (ref 22.0–30.0)
CREATININE: 1.21 mg/dL (ref 0.70–1.30)
EGFR CKD-EPI AA MALE: 74 mL/min/{1.73_m2} (ref >=60)
EGFR CKD-EPI NON-AA MALE: 64 mL/min/{1.73_m2} (ref >=60)
POTASSIUM: 4.4 mmol/L (ref 3.5–5.0)
SODIUM: 139 mmol/L (ref 135–145)

## 2018-08-28 LAB — GLUCOSE RANDOM: Glucose:MCnc:Pt:Ser/Plas:Qn:: 161

## 2018-08-28 MED ORDER — FLUTICASONE 250 MCG-SALMETEROL 50 MCG/DOSE BLISTR POWDR FOR INHALATION
Freq: Two times a day (BID) | RESPIRATORY_TRACT | 1 refills | 0.00000 days | Status: CP
Start: 2018-08-28 — End: 2018-10-27

## 2018-08-28 MED ORDER — UMECLIDINIUM 62.5 MCG/ACTUATION BLISTER POWDER FOR INHALATION
Freq: Every day | RESPIRATORY_TRACT | 1 refills | 0.00000 days | Status: CP
Start: 2018-08-28 — End: 2018-10-27

## 2018-08-28 MED ORDER — DONEPEZIL 10 MG TABLET
Freq: Every evening | ORAL | 0 refills | 0.00000 days
Start: 2018-08-28 — End: ?

## 2018-08-28 MED ORDER — EZETIMIBE 10 MG TABLET
ORAL_TABLET | Freq: Every evening | ORAL | 2 refills | 0 days | Status: CP
Start: 2018-08-28 — End: 2018-09-27

## 2018-08-28 NOTE — Unmapped (Signed)
Pt is A&Ox4, used Bipap last night, voiding, went to ECHO this morning, will continue to monitor.   Problem: Adult Inpatient Plan of Care  Goal: Plan of Care Review  Outcome: Progressing  Goal: Patient-Specific Goal (Individualization)  Outcome: Progressing  Goal: Absence of Hospital-Acquired Illness or Injury  Outcome: Progressing  Goal: Optimal Comfort and Wellbeing  Outcome: Progressing  Goal: Readiness for Transition of Care  Outcome: Progressing  Goal: Rounds/Family Conference  Outcome: Progressing     Problem: Adjustment to Illness (Heart Failure)  Goal: Optimal Coping  Outcome: Progressing     Problem: Fluid Imbalance (Heart Failure)  Goal: Fluid Balance  Outcome: Progressing     Problem: Functional Ability Impaired (Heart Failure)  Goal: Optimal Functional Ability  Outcome: Progressing     Problem: Oral Intake Inadequate (Heart Failure)  Goal: Optimal Nutrition Intake  Outcome: Progressing     Problem: COPD Comorbidity  Goal: Maintenance of COPD Symptom Control  Outcome: Progressing     Problem: Diabetes Comorbidity  Goal: Blood Glucose Level Within Desired Range  Outcome: Progressing     Problem: Heart Failure Comorbidity  Goal: Maintenance of Heart Failure Symptom Control  Outcome: Progressing     Problem: Obstructive Sleep Apnea Risk or Actual (Comorbidity Management)  Goal: Unobstructed Breathing During Sleep  Outcome: Progressing

## 2018-08-28 NOTE — Unmapped (Signed)
Care Management  Initial Transition Planning Assessment              General  Care Manager assessed the patient by : In person interview with patient, Medical record review, Discussion with Clinical Care team  Orientation Level: Oriented X4  Who provides care at home?: (Pt is independent with his ADLs at home and drives.)  Reason for referral: Discharge Planning    Contact/[facility]  Extended Emergency Contact Information  Primary Emergency Contact: Gatlin,Kim  Address: [address]           [place], [place] [address] [place]  Mobile Phone: [phone]  Relation: Daughter  Preferred language: ENGLISH  Interpreter needed? No    Legal Next of Kin / Guardian / POA / Advance Directives     Advance Directive (Medical Treatment)  Reason patient does not have an advance directive covering medical treatment:: Patient does not wish to complete one at this time.  Healthcare Decision Maker: Patient does not wish to appoint a Health Care Decision Maker at this time    Health Care Decision Maker [HCDM] (Medical & Mental Health Treatment)  Healthcare Decision Maker: Patient does not wish to appoint a Health Care Decision Maker at this time  Information offered on HCDM, Medical & Mental Health advance directives:: Patient declined information.    Advance Directive (Mental Health Treatment)  Does patient have an advance directive covering mental health treatment?: Patient does not have advance directive covering mental health treatment.  Reason patient does not have an advance directive covering mental health treatment:: Patient does not wish to complete one at this time.    Patient Information  Lives with: Family members(Pt lives with his daughter-in-law)    Type of Residence: Private residence     Location/Detail: This is a single wide mobile home with 6-7 steps to access from the back entrance and 3-4 steps to access from the front entrance. Pt denied any difficulty navigating the steps. Support Systems: Family Members    Responsibilities/Dependents at home?: No    Home Care services in place prior to admission?: No    Outpatient/Community Resources in place prior to admission: (None)    Equipment Currently Used at Home: cane, straight, shower chair, oxygen(Pt also has a BIPAP that he uses nightly.)  Current HME Agency (Name/Phone #): Pt uses oxygen at 5 liters continuously. [facility] provides the oxygen and pt has a concentrator and portable tanks. Daughter-in-law will bring the portable oxygen for discharge home.    Currently receiving outpatient dialysis?: No    Type of Residence: Mailing Address:  [address]  [place] [address]     Contacts:   Patient Phone Number: [phone]  Manon Hilding (daughter): [phone]        Medical Provider(s): Eloisa Northern, MD - confirmed  Reason for Admission: Admitting Diagnosis:  No admission diagnoses are documented for this encounter.  Past Medical History:   has a past medical history of Back pain (2002), CAD (coronary artery disease), CHF (congestive heart failure) (CMS-HCC), Dementia (CMS-HCC) (2019), Depression (2018), Diabetes mellitus (CMS-HCC) (1998), H/O chest pain (2019), Hyperlipidemia, Hypertension, Hypoxemia, Obesity, OSA (obstructive sleep apnea), and Pulmonary embolism (CMS-HCC).  Past Surgical History:   has a past surgical history that includes Back surgery; Hernia repair; Patent foramen ovale closure; Coronary stent placement; Cardiac catheterization; and pr cath place/coron angio, img super/interp,w left heart ventriculography (N/A, 07/29/2017).   Previous admit date: 04/17/2016    Primary Insurance- Payor: Advertising copywriter  MEDICARE ADV / Plan: UNITED HEALTHCARE MEDICARE ADV / Product Type: *No Product type* /   Secondary Insurance ??? None  Prescription Coverage ??? [employer] Conservation officer, nature  Preferred Pharmacy - [facility] - [place], [place] - [address]  [facility]  [facility] [place] [address]  [facility]  [facility] [phone] - [place], [place] - [address]    Transportation home: Private vehicle. Daughter-in-law will transport the pt home when he discharges  Level of function prior to admission: Independent. Pt denied any alcohol, tobacco, or illicit drug use.    Financial Information    Need for financial assistance?: No. Pt is collecting disability       Social Determinants of Health    ??? Food insecurity     Worry: Never true     Inability: Never true     Discharge Needs Assessment  Concerns to be Addressed: denies needs/concerns at this time    Clinical Risk Factors: Multiple Diagnoses (Chronic)    Barriers to taking medications: No    Prior overnight hospital stay or ED visit in last 90 days: No    Readmission Within the Last 30 Days: no previous admission in last 30 days    Anticipated Changes Related to Illness: none    Equipment Needed After Discharge: none    Discharge Facility/Level of Care Needs: (Home)    Readmission  Risk of Unplanned Readmission Score:  %  Predictive Model Details           20% (Medium) Factors Contributing to Score   Calculated 08/28/2018 11:43 40% Number of active Rx orders is 62   [place] Risk of Unplanned Readmission Model 9% ECG/EKG order is present in last 6 months     8% Latest BUN is high (34 mg/dL)     6% Imaging order is present in last 6 months     6% Latest hemoglobin is low (12.7 g/dL)     6% Phosphorous result is present     5% Charlson Comorbidity Index is 5     5% Number of ED visits in last six months is 1     Readmitted Within the Last 30 Days? (No if blank)   Patient at risk for readmission?: Yes    Discharge Plan  Screen findings are: Care Manager reviewed the plan of the patient's care with the Multidisciplinary Team. No discharge planning needs identified at this time. Care Manager will continue to manage plan and monitor patient's progress with the team.    Expected Discharge Date: (TBD)    Expected Transfer from Critical Care: (N/A)    Patient and/or family were provided with choice of facilities / services that are available and appropriate to meet post hospital care needs?: N/A       Initial Assessment complete?: Yes      CM met with patient in pt room.  Pt/visitors were not wearing hospital provided masks for the duration of the interaction with CM. Roommate was not wearing a mask either.  CM was wearing hospital provided surgical mask and hospital provided eye protection.  CM was within 6 foot of the patient/visitors during this interaction.

## 2018-08-28 NOTE — Unmapped (Signed)
OCCUPATIONAL THERAPY  Evaluation (08/28/18 1008)    Patient Name:  Thomas Alberts Sr.       Medical Record Number: [id number]   Date of Birth: [birth year]  Sex: Male          OT Treatment Diagnosis:       Assessment  Assessment: Thomas Alberts Sr. is a 63 y.o. male with a PMHx of HFpEF, HTN, HLD, CAD s/p 2 stents in 2011, COPD, OSA, DM, and a history of ASD/PFO s/p closure that presented to [facility] with chest pain. LHC on 7/10 did not show any major blockages. Patient presents to OT at functional baseline with an AMPAC score of 24/24 indicating a 0% impairment in self care. Acute skilled OT services are not indicated at this time. Please re-consult if status changes. Based on pt's occupational profile, assessment review, level of clinical decision making involved, and intervention plan, this evaluation is considered to be a  low complexity case.  Today's Interventions: AMPAC 24/24, OT evaluation, bed mobility, functional transfers and functional mobility, education on role of OT, POC, safety, importance of RN assist with OOB mobility.     Activity Tolerance During Today's Session  Patient tolerated treatment well    Plan  Planned Frequency of Treatment:  D/C Services for: D/C Services       Planned Interventions:       Post-Discharge Occupational Therapy Recommendations:  OT Post Acute Discharge Recommendations: OT services not indicated   OT DME Recommendations: None    GOALS:   Patient and Family Goals: Go home.        Prognosis:  Good  Positive Indicators:  PLOF  Barriers to Discharge: None    Subjective  Current Status Pt left seated in bedside chair with all needs met, call bell within reach, RN aware   Prior Functional Status PTA pt was independent in ADLs and required assist for IADLs. Pt reports he does not leave the house much. He has a cane but hasn't used it in a few weeks. No recent falls.             Patient / Caregiver reports: I have a date tonight.    Past Medical History:   Diagnosis Date ??? Back pain 2002    Said that doctor told him he has degenerative disk disease   ??? CAD (coronary artery disease)     s/p 2 stents in 2011, LHC 2020 showed no major blockages, but had ectatic coronaries   ??? CHF (congestive heart failure) (CMS-HCC)     HFpEF, G2DD, EF 55-60%   ??? Dementia (CMS-HCC) 2019    Brain imaging suggested dementia. Has some issues with short term memory   ??? Depression 2018    Father, brother, and daughter passed away within last 2 years. Very difficult for patient, but no suicidal ideation.   ??? Diabetes mellitus (CMS-HCC) 1998    A1c 7.2 (2020), peripheral neuropathy - no sensation in bilateral lower extremeties up to mid calf   ??? H/O chest pain 2019    Has had substernal chest pain over the last year   ??? Hyperlipidemia    ??? Hypertension    ??? Hypoxemia    ??? Obesity    ??? OSA (obstructive sleep apnea)    ??? Pulmonary embolism (CMS-HCC)     Social History     Tobacco Use   ??? Smoking status: Never Smoker   ??? Smokeless tobacco: Current User     Types: Snuff   ???  Tobacco comment: Has dipped for 45 years   Substance Use Topics   ??? Alcohol use: Not Currently     Alcohol/week: 0.0 standard drinks     Comment: Rare      Past Surgical History:   Procedure Laterality Date   ??? BACK SURGERY     ??? CARDIAC CATHETERIZATION     ??? CORONARY STENT PLACEMENT     ??? HERNIA REPAIR     ??? PATENT FORAMEN OVALE CLOSURE      closure at [address] [facility]    ??? PR CATH PLACE/CORON ANGIO, IMG SUPER/INTERP,W LEFT HEART VENTRICULOGRAPHY N/A 07/29/2017    Procedure: Left Heart Catheterization;  Surgeon: Alvira Philips, MD;  Location: [facility] CATH;  Service: Cardiology    Family History   Problem Relation Age of Onset   ??? Heart attack Mother    ??? Heart disease Mother    ??? Hypertension Mother    ??? Heart failure Mother    ??? Heart disease Father    ??? Heart attack Father    ??? Hypertension Father    ??? Heart failure Father    ??? Dementia Father    ??? Lung disease Brother         Codeine     Objective Findings  Precautions / Restrictions  Falls precautions    Weight Bearing  Non-applicable    Required Braces or Orthoses  Non-applicable    Communication Preference  Verbal    Pain  No c/o pain    Equipment / Environment  Vascular access (PIV, TLC, Port-a-cath, PICC);Telemetry    Living Situation  Living Environment: Trailer  Lives With: Other(Daughter-in-law )  Home Living: One level home;Stairs to enter with rails;Tub/shower unit;Standard height toilet  Rail placement (outside): Bilateral rails  Number of Stairs: 6     Cognition   Orientation Level:  Oriented x 4   Arousal/Alertness:  Appropriate responses to stimuli   Attention Span:  Appears intact   Memory:  Appears intact   Following Commands:  Follows all commands and directions without difficulty   Safety Judgment:  Good awareness of safety precautions   Awareness of Errors:  Good awareness of safety precautions   Problem Solving:  Able to problem solve independently   Comments:      Vision / Perception    Hearing: WFL      Perception: WFL       Hand Function     WFL    Skin Inspection  Exposed skin intact     ROM / Strength/Coordination  UE ROM/ Strength/ Coordination: B UE ROM, strength, coordination WFL        Sensation:       Balance:  Sitting balance/standing balance with independence     Functional Mobility  Transfer Assistance Needed: No  Bed Mobility Assistance Needed: No      ADLs  ADLs: Independent             Medical Staff Made Aware: RN aware       Occupational Therapy Session Duration  OT Individual - Duration: 15         I attest that I have reviewed the above information.  Signed: Barb Merino, OT  Filed 08/28/2018

## 2018-08-28 NOTE — Unmapped (Signed)
Pt Aox4, able to make needs known.  Pt D/c to home after LHC.  Pt's R radial site covered with Tegaderm and C/D/I.  No s/s of hematoma or bleeding.   Educated on d/c instructions, all questions answered.    No calls from tele.   Daily COVID screening negative     Problem: Adult Inpatient Plan of Care  Goal: Plan of Care Review  Outcome: Resolved  Goal: Patient-Specific Goal (Individualization)  Outcome: Resolved  Goal: Absence of Hospital-Acquired Illness or Injury  Outcome: Resolved  Goal: Optimal Comfort and Wellbeing  Outcome: Resolved  Goal: Readiness for Transition of Care  Outcome: Resolved  Goal: Rounds/Family Conference  Outcome: Resolved     Problem: Adjustment to Illness (Heart Failure)  Goal: Optimal Coping  Outcome: Resolved     Problem: Fluid Imbalance (Heart Failure)  Goal: Fluid Balance  Outcome: Resolved     Problem: Functional Ability Impaired (Heart Failure)  Goal: Optimal Functional Ability  Outcome: Resolved     Problem: Oral Intake Inadequate (Heart Failure)  Goal: Optimal Nutrition Intake  Outcome: Resolved     Problem: COPD Comorbidity  Goal: Maintenance of COPD Symptom Control  Outcome: Resolved     Problem: Diabetes Comorbidity  Goal: Blood Glucose Level Within Desired Range  Outcome: Resolved     Problem: Heart Failure Comorbidity  Goal: Maintenance of Heart Failure Symptom Control  Outcome: Resolved     Problem: Obstructive Sleep Apnea Risk or Actual (Comorbidity Management)  Goal: Unobstructed Breathing During Sleep  Outcome: Resolved

## 2018-08-28 NOTE — Unmapped (Signed)
PHYSICAL THERAPY  Evaluation (08/28/18 0924)     Patient Name:  Thomas Alberts Sr.       Medical Record Number: [id number]   Date of Birth: Nov 23, 1955  Sex: Male            Treatment Diagnosis: decreased endurnace    ASSESSMENT  Problem List: Fall Risk;Decreased endurance;Decreased mobility     Assessment : Thomas Alberts Sr. is a 63 y.o. male with a PMHx of HFpEF, HTN, HLD, CAD s/p 2 stents in 2011, COPD, OSA, DM, and a history of ASD/PFO s/p closure that presented to [facility] with chest pain. LHC on 7/10 did not show any major blockages. PT agreeable to PT eval. Pt present near baseline mobility and is eager to d/c home today. Pt presents with decreased endurance but reports his mobility has progressed to ambulation in the home wihtout AD. Will continue to benefit skilled PT to address functional mobility and progress towards goals. Pt declines HHPT as I don't need it. I'm fine. After a review of the personal factors, comorbidities, clinical presentation, and examination of the number of affected body systems, the patient presents as a mod complexity case.     Today's Interventions: mobility assessment as able, education on role of PT, Poc        PLAN  Planned Frequency of Treatment:  1-2x per day for: 2-3x week      Planned Interventions: Balance activities;Education - Patient;Endurance activities;Functional mobility;Gait training;Therapeutic activity;Transfer training;Self-care / Home training    Post-Discharge Physical Therapy Recommendations:  PT services not indicated    PT DME Recommendations: None           Goals:   Patient and Family Goals: home asap so he can get ready for his date tonight    Long Term Goal #1: Pt will return to PLOF in 6 weeks       SHORT GOAL #1: Pt will ambulate 50 feet with LRAD and mod (I)              Time Frame : 1 week  SHORT GOAL #2: Pt will navigate up/down 3 STE with rails and supervision              Time Frame : 1 week       Prognosis:  Good  Positive Indicators: PLOF, CLOF, motivation  Barriers to Discharge: None    SUBJECTIVE  Patient reports: I'm ready to go home. I'm doing great. Reports he is at his baseline mobility and is eager to d/c home today  Current Functional Status: Pt received reclined in bed, was left seated in recliner w/ all needs in reach, eating breakfast     Prior Functional Status: Reports since last admission had returned to ambulating in home without AD, ind with ADLs and cooking (though then stated he cooked cereal). Lives with DIL who assists with IADL's. Pt reports he has not been out of the house much since the start of covid. He reports most of his family has died and he is the only remaining family member still living. His DIL has stayed with him to support him. He reports recent connection with his high school sweet heart and they have a date planned for tonight.   Equipment available at home: [place];Wheelchair-manual;Shower chair     Past Medical History:   Diagnosis Date   ??? Back pain 2002    Said that doctor told him he has degenerative disk disease   ??? CAD (coronary artery  disease)     s/p 2 stents in 2011, LHC 2020 showed no major blockages, but had ectatic coronaries   ??? CHF (congestive heart failure) (CMS-HCC)     HFpEF, G2DD, EF 55-60%   ??? Dementia (CMS-HCC) 2019    Brain imaging suggested dementia. Has some issues with short term memory   ??? Depression 2018    Father, brother, and daughter passed away within last 2 years. Very difficult for patient, but no suicidal ideation.   ??? Diabetes mellitus (CMS-HCC) 1998    A1c 7.2 (2020), peripheral neuropathy - no sensation in bilateral lower extremeties up to mid calf   ??? H/O chest pain 2019    Has had substernal chest pain over the last year   ??? Hyperlipidemia    ??? Hypertension    ??? Hypoxemia    ??? Obesity    ??? OSA (obstructive sleep apnea)    ??? Pulmonary embolism (CMS-HCC)     Social History     Tobacco Use   ??? Smoking status: Never Smoker   ??? Smokeless tobacco: Current User     Types: Snuff   ??? Tobacco comment: Has dipped for 45 years   Substance Use Topics   ??? Alcohol use: Not Currently     Alcohol/week: 0.0 standard drinks     Comment: Rare      Past Surgical History:   Procedure Laterality Date   ??? BACK SURGERY     ??? CARDIAC CATHETERIZATION     ??? CORONARY STENT PLACEMENT     ??? HERNIA REPAIR     ??? PATENT FORAMEN OVALE CLOSURE      closure at [address] [facility]    ??? PR CATH PLACE/CORON ANGIO, IMG SUPER/INTERP,W LEFT HEART VENTRICULOGRAPHY N/A 07/29/2017    Procedure: Left Heart Catheterization;  Surgeon: Alvira Philips, MD;  Location: [facility] CATH;  Service: Cardiology    Family History   Problem Relation Age of Onset   ??? Heart attack Mother    ??? Heart disease Mother    ??? Hypertension Mother    ??? Heart failure Mother    ??? Heart disease Father    ??? Heart attack Father    ??? Hypertension Father    ??? Heart failure Father    ??? Dementia Father    ??? Lung disease Brother         Allergies: Codeine     Objective Findings  Precautions / Restrictions  Precautions: Falls precautions  Weight Bearing Status: Non-applicable  Required Braces or Orthoses: Non-applicable    Communication Preference: Verbal   Pain Comments: none stated  Medical Tests / Procedures: reviewed per epic  Equipment / Environment: Patient wearing mask for full session;Vascular access (PIV, TLC, Port-a-cath, PICC);Telemetry;Supplemental oxygen(PT wearing maks and eye shield for full session)    At Rest: VSS   With Activity: NAD  Orthostatics: asymptomatic       Living Situation  Living Environment: Trailer  Lives With: Other(Daughter-in-law )  Home Living: One level home;Stairs to enter with rails;Tub/shower unit;Standard height toilet  Rail placement (outside): Bilateral rails  Number of Stairs: 6     Cognition: A&O x 4. Pt does become tearful when talking about his daughter passing earlier this year.           UE ROM: WFL  UE Strength: WFL  LE ROM: WFL  LE Strength: [facility]  Sensation: baseline numbness B feet Balance: good sitting and standing balance         Bed Mobility: mod (I)  Transfers: mod (I)   Gait  Level of Assistance: Modified independent, requires aide device or extra time  Assistive Device: None  Distance Ambulated (ft): 3 ft  Gait: ambulated bed>chair without AD. Pt reports he is at his baseline. He was able to ambulate to stretcher in hallway.   Stairs: not assessed      Endurance: adequate for mobility today    Physical Therapy Session Duration  PT Individual - Duration: 18(overlap with OT for safe mobility)    Medical Staff Made Aware: RN cleared pt to work with PT and was updated at end of session.     I attest that I have reviewed the above information.  Signed: Gillermo Murdoch, PT  Filed 08/28/2018

## 2018-08-28 NOTE — Unmapped (Signed)
Physician Discharge Summary [facility]  3 AD [facility]  [address]  [place] [address]  Dept: [phone]  Loc: [phone]     Identifying Information:   Thomas Alberts Sr.  06-23-1955  [id number]    Primary Care Physician: Eloisa Northern, MD   Code Status: DNR and DNI    Admit Date: 08/26/2018    Discharge Date: 08/28/2018     Discharge To: Home    Discharge Service: CARD Anderson 1     Discharge Attending Physician: Jacquelyne Balint, MD    Discharge Diagnoses:  Principal Problem:    Chest pain POA: Yes  Active Problems:    Coronary artery disease involving native coronary artery of native heart POA: Yes    Hypertensive heart disease with heart failure (CMS-HCC) POA: Yes    Hyperlipidemia LDL goal <100 (RAF-HCC) POA: Yes    Morbid obesity (CMS-HCC) POA: Yes    Diabetes mellitus (CMS-HCC) POA: Yes    Obstructive sleep apnea POA: Yes    HTN (hypertension) POA: Yes    GERD (gastroesophageal reflux disease) POA: Yes    Dyslipidemia POA: Yes    Depression POA: Yes  Resolved Problems:    * No resolved hospital problems. *      Outpatient Provider Follow Up Issues:   - Follow-up AKI with BMP in one week   - Evaluate need for 5L O2. Get outpatient PFT's/6-minute walk test with pulmonology   - Evaluate cost effectiveness of replacement inhalers   - Start Repatha w/ Cardiology and follow-up repeat lipid panel in 3 months    New Meds:  -Zetia 10   -Repatha to be started as outpatient     Hospital Course:   Thomas Owens is a63 y.o. male with PMHx of HFpEF, HTN, HLD, CAD s/p 2 stents in 2011, COPD (on 5L home O2), OSA, PE (2017, not on [facility]), DM and a history of ASD/PFO s/p closure who presented to [facility] with CP. Hospital course by problem list below.     Chest Pain: Patient presented with one day history of retrosternal CP and palpitations associated with nausea and initially responsive to NTG. He was loaded with aspirin PTA. In the ED,  EKG was unchanged from prior without concern for ST changes, BBB, T-wave abnormalities. Trop negative x4. D-dimer obtained and minimally elevated but patient without tachycardia (though on BB) or worsening hypoxia. Bilateral LE venous duplexes obtained and without evidence of DVT. V/Q scan deferred given bibasilar opacities and CTA not pursued given unlikely diagnosis and anticipated contrast load from [facility]. Patient continued having chest pain while inpatient and given his known CAD and instent thrombosis, LHC was completed and showed minimal change from [facility] in 2019, no major blockages. After the LHC, even without changes, his chest pain was markedly improved. He was chest pain free for the remained of his hospitalization. There was no clear inciting etiology of his chest pain but felt most likely 2/2 to chronic CAD and poorly controlled COPD.     CAD - HLD - HTN: Continued on home DAPT, atorvastatin and anti-hypertensives. LHC showed 50% in-stent restenosis of mid LAD unchanged from prior. No other evidence of obstructive CAD. Lipid panel was obtained and LDL elevated at 146 with low HDL of 21. He was continued on atorvastatin 80 and Zetia 10mg  daily was started. He will follow-up with cardiology for potential PCSK9 initiation. His BP was well controlled throughout.     HFpEF: Patient with known G2DD. CXR w/o pulmonary edema and  oxygen requirement at baseline on admission. Pro-BNP minimally elevated from baseline of 50's. Received one dose of IV lasix. Repeat echo showed EF of 55-60%, LVH, dilated LA, dilated ascending aorta. Resumed home regimen without any changes.     COPD: On 5L at home and on the same here. Unclear cause of COPD but severe per outside PFT report. Normal A1AT testing. Nightly BiPAP continued while inpatient. Following with [facility] but no visit since 07/2017. Had not used his inhalers in 7 month secondary to cost. Changed Symbicort to Advair and Spiriva to Incruse Ellipta at discharge in accordance with patient's formulary. Likely does not need 5L of oxygen but patient unwilling to try lower amounts at this time. Encouraged 6 minute walk test to eval further. Will encourage outpatient pulmonology follow-up.     T2DM: A1c obtained on admission 7.2 improved from 10.7 one year prior. Home metformin held in lieu of SSI. Continued on home NPH regimen. BG well controlled throughout. No changes to home regimen made on discharge.     AKI, most likely prerenal: Patient came in with creatinine of 0.95 and increased to 1.36, most likely due to diuresis. Improved with oral hydration. Needs follow-up BMP in one week.         Procedures:  cardiac catheterization  No admission procedures for hospital encounter.  ______________________________________________________________________  Discharge Medications:     Your Medication List      STOP taking these medications    budesonide-formoteroL 160-4.5 mcg/actuation inhaler  Commonly known as:  SYMBICORT  Replaced by:  fluticasone propion-salmeteroL 250-50 mcg/dose diskus     tiotropium 18 mcg inhalation capsule  Commonly known as:  SPIRIVA  Replaced by:  umeclidinium 62.5 mcg/actuation inhaler        START taking these medications    evolocumab 140 mg/mL Pnij  Inject the contents of 1 pen (140 mg) under the skin every fourteen (14) days.     ezetimibe 10 mg tablet  Commonly known as:  ZETIA  Take 1 tablet (10 mg total) by mouth nightly.     fluticasone propion-salmeteroL 250-50 mcg/dose diskus  Commonly known as:  ADVAIR  Inhale 1 puff 2 (two) times a day.  Replaces:  budesonide-formoteroL 160-4.5 mcg/actuation inhaler     umeclidinium 62.5 mcg/actuation inhaler  Commonly known as:  INCRUSE ELLIPTA  Inhale 1 puff once daily.  Replaces:  tiotropium 18 mcg inhalation capsule        CHANGE how you take these medications    donepeziL 10 MG tablet  Commonly known as:  ARICEPT  Take 1 tablet (10 mg total) by mouth nightly.  What changed:  how much to take        CONTINUE taking these medications    albuterol 90 mcg/actuation inhaler  Commonly known as:  PROVENTIL HFA;VENTOLIN HFA  Inhale 2 puffs every six (6) hours as needed for wheezing.     aspirin 81 MG tablet  Commonly known as:  ECOTRIN  Take 81 mg by mouth daily.     BD VEO INSULIN SYRINGE UF 1/2 mL 31 gauge x 15/64 (6 mm) Syrg  Generic drug:  insulin syringe-needle U-100     bethanechol 10 MG tablet  Commonly known as:  URECHOLINE  Take 10 mg by mouth Two (2) times a day.     buPROPion 300 MG 24 hr tablet  Commonly known as:  WELLBUTRIN XL  Take 300 mg by mouth daily.     clopidogreL 75 mg tablet  Commonly known  as:  PLAVIX  Take 75 mg by mouth daily.     diphenhydrAMINE 25 mg capsule/tablet  Commonly known as:  BENADRYL  Take 25 mg by mouth every six (6) hours as needed for itching.     gabapentin 300 MG capsule  Commonly known as:  NEURONTIN  Take 600 mg by mouth Four (4) times a day.     insulin NPH 100 unit/mL injection  Commonly known as:  HumuLIN,NovoLIN  Inject 10-15 Units under the skin daily.     isosorbide mononitrate 30 MG 24 hr tablet  Commonly known as:  IMDUR  Take 3 tablets (90 mg total) by mouth daily.     lisinopriL 10 MG tablet  Commonly known as:  PRINIVIL,ZESTRIL  Take 10 mg by mouth daily.     loperamide 2 mg capsule  Commonly known as:  IMODIUM  Take 2 mg by mouth 4 (four) times a day as needed for diarrhea.     metFORMIN 1000 MG tablet  Commonly known as:  GLUCOPHAGE  Take 1,000 mg by mouth 2 (two) times a day with meals.     metoprolol tartrate 100 MG tablet  Commonly known as:  LOPRESSOR  Take 100 mg by mouth Two (2) times a day.     nitroglycerin 0.4 MG SL tablet  Commonly known as:  NITROSTAT  Place 1 tablet (0.4 mg total) under the tongue every five (5) minutes as needed for chest pain.     oxyCODONE-acetaminophen 10-325 mg per tablet  Commonly known as:  PERCOCET  Take 1 tablet by mouth Every six (6) hours.     pantoprazole 40 MG tablet  Commonly known as:  PROTONIX  Take 40 mg by mouth daily.     rosuvastatin 40 MG tablet  Commonly known as:  CRESTOR  Take 40 mg by mouth daily. sertraline 100 MG tablet  Commonly known as:  ZOLOFT  Take 100 mg by mouth daily.     tamsulosin 0.4 mg capsule  Commonly known as:  FLOMAX  Take 0.8 mg by mouth nightly.     torsemide 20 MG tablet  Commonly known as:  DEMADEX  TAKE 2 TABLETS BY MOUTH ON MONDAY, WEDNESDAY AND FRIDAY AND 3 TABLETS ON ALL OTHER DAYS     VITAMIN D3 25 mcg (1,000 unit) capsule  Generic drug:  cholecalciferol (vitamin D3)  Take 1,000 Units by mouth daily.            Allergies:  Codeine  ______________________________________________________________________  Pending Test Results (if blank, then none):      Most Recent Labs:  All lab results last 24 hours -   Recent Results (from the past 24 hour(s))   POCT Glucose    Collection Time: 08/27/18  4:47 PM   Result Value Ref Range    Glucose, POC 149 70 - 179 mg/dL   Basic Metabolic Panel    Collection Time: 08/27/18  5:08 PM   Result Value Ref Range    Sodium 136 135 - 145 mmol/L    Potassium 4.4 3.5 - 5.0 mmol/L    Chloride 96 (L) 98 - 107 mmol/L    CO2 30.0 22.0 - 30.0 mmol/L    Anion Gap 10 7 - 15 mmol/L    BUN 39 (H) 7 - 21 mg/dL    Creatinine 1.61 (H) 0.70 - 1.30 mg/dL    BUN/Creatinine Ratio 28     EGFR CKD-EPI Non-African American, Male 55 (L) >=60 mL/min/1.82m2    EGFR  CKD-EPI African American, Male 72 >=60 mL/min/1.23m2    Glucose 154 70 - 179 mg/dL    Calcium 8.2 (L) 8.5 - 10.2 mg/dL   POCT Glucose    Collection Time: 08/27/18  9:33 PM   Result Value Ref Range    Glucose, POC 209 (H) 70 - 179 mg/dL   POCT Glucose    Collection Time: 08/28/18  5:32 AM   Result Value Ref Range    Glucose, POC 167 70 - 179 mg/dL   Echocardiogram W Colorflow Spectral Doppler With Contrast    Collection Time: 08/28/18  7:06 AM   Result Value Ref Range    LV Diastolic Diameter PLAX 4.9 cm    LV Systolic Diameter PLAX 3.3 cm    IVS Diastolic Thickness 1.4 cm    LVPW Diastolic Thickness 1.4 cm    Aortic Root Diameter 4.1 cm    LA Systolic Diameter LX 6.0 cm    LA Area 4C View 23.9 cm2    LA Area 2C View 23.8 cm2    LA Volume 85.2 cm3    Ascending Aorta Diameter 3.7 cm    AV Peak Velocity 1.6 m/s    AV Peak Gradient 10.1     Mitral E Point Velocity 0.0 m/s    Mitral A Point Velocity 0.0 m/s    Mitral E to A Ratio 1.6     PV Peak Velocity 1.2 m/s    LV E' Lateral Velocity 0.1 m/s    PV peak gradient 5.86 mmHg    Aorta at Sinuses Diameter 4.1 cm   Basic Metabolic Panel    Collection Time: 08/28/18 10:14 AM   Result Value Ref Range    Sodium 139 135 - 145 mmol/L    Potassium 4.4 3.5 - 5.0 mmol/L    Chloride 100 98 - 107 mmol/L    CO2 31.0 (H) 22.0 - 30.0 mmol/L    Anion Gap 8 7 - 15 mmol/L    BUN 34 (H) 7 - 21 mg/dL    Creatinine 1.61 0.96 - 1.30 mg/dL    BUN/Creatinine Ratio 28     EGFR CKD-EPI Non-African American, Male 76 >=60 mL/min/1.44m2    EGFR CKD-EPI African American, Male 72 >=60 mL/min/1.78m2    Glucose 161 70 - 179 mg/dL    Calcium 8.7 8.5 - 04.5 mg/dL   POCT Glucose    Collection Time: 08/28/18 10:51 AM   Result Value Ref Range    Glucose, POC 207 (H) 70 - 179 mg/dL       Relevant Studies/Radiology (if blank, then none):  Xr Chest 1 View Portable    Result Date: 08/26/2018  EXAM: XR CHEST PORTABLE DATE: 08/26/2018 6:10 AM ACCESSION: [id number] UN DICTATED: 08/26/2018 6:27 AM INTERPRETATION LOCATION: [facility] CLINICAL INDICATION: 63 year old Male with CHEST PAIN  COMPARISON: Chest radiograph 08/26/2017 and prior exams. TECHNIQUE: Portable Chest Radiograph. FINDINGS: Sequelae of prior ACDF. Hypoinflated lungs. Streaky bibasilar opacities. No pleural effusion or pneumothorax Stable enlarged cardiomediastinal silhouette.     Streaky bibasilar opacities, likely atelectasis.     Echocardiogram W Colorflow Spectral Doppler With Contrast    Result Date: 08/28/2018  ?? Technically difficult study due to chest wall/lung interference ?? Ultrasound enhancing agent utilized to improve endocardial border definition ?? Left ventricular hypertrophy - mild to moderate ?? Normal left ventricular systolic function, ejection fraction 55 to 60% ?? Dilated left atrium ?? Dilated ascending aorta ?? Dilated right ventricle - mild ?? Normal right ventricular systolic function  Pvl Venous Duplex Lower Extremity Bilateral    Result Date: 08/26/2018   Peripheral Vascular Lab     [address]   [place], [place] [address]  PVL VENOUS DUPLEX LOWER EXTREMITY BILATERAL Patient Demographics Pt. Name: Thomas Owens [facility] SR. Location: PVL Inpatient Lab MRN:      [id number]               Sex:      M DOB:      09/30/1955              Age:      42 years  Study Information Authorizing Provider [id number] [employer] Touch      Performed Time        08/26/2018 2:56:38 Name                 Allison Gap                                    PM Ordering Physician   Joyice Faster       Patient Location      [facility] Accession Number     [id number] UN      Technologist          Carlton Adam                                                               RVT Diagnosis:                              Assisting                                         Technologist Ordered Reason For Exam: DVT Indication: SOB chest pain, PE Risk Factors: Immobility, confirmed PE and (obesity). Anticoagulation: (Unknown). Protocol The major deep veins from the inguinal ligament to the ankle are assessed for bilaterally for compressibility and spectral Doppler flow characteristics. The assessed veins include bilateral common femoral vein, femoral vein in the thigh, popliteal vein, and intramuscular calf veins. The iliac vein is assessed indirectly using Doppler waveform analysis. The great saphenous vein is assessed for compressibility at the saphenofemoral junction, and the small saphenous vein assessed for compressibility behind the knee. Limitations: Body habitus and poor ultrasound/tissue interface.  Right Duplex Findings All veins visualized appear fully compressible. Doppler flow signals demonstrate normal spontaneity, phasicity, and augmentation. Not visualized segments included the proximal calf veins.  Left Duplex Findings All veins visualized appear fully compressible. Doppler flow signals demonstrate normal spontaneity, phasicity, and augmentation. Not visualized segments included the proximal calf veins.  Right Technical Summary No evidence of deep venous obstruction in the lower extremity. No indirect evidence of obstruction proximal to the inguinal ligament. Not visualized segments included the proximal calf veins.  Left Technical Summary No evidence of deep venous obstruction in the lower extremity. No indirect evidence of obstruction proximal to the inguinal ligament. Not visualized segments included the proximal calf veins.  Final Interpretation Right There is no evidence of DVT in the lower extremity. However, portions of this examination  were limited- see technologist comments above. There is no evidence of obstruction proximal to the inguinal ligament or in the common femoral vein. Left There is no evidence of DVT in the lower extremity. However, portions of this examination were limited- see technologist comments above. There is no evidence of obstruction proximal to the inguinal ligament or in the common femoral vein.  Electronically signed by [id number] Jodell Cipro MD on 08/26/2018 at 3:31:31 PM.  FINAL    Cath/vascular Procedure    Result Date: 08/27/2018  Cardiac Catheterization Laboratory [facility] [place], [place] Tel: [phone] Fax: [phone] CARDIAC CATHETERIZATION REPORT Date of Procedure: August 27, 2018 ___________________________________________________________________________ _ Findings: 1. No obstructive coronary disease. 2.   Normal left ventricular filling pressures (LVEDP = 4  mmHg) Recommendations: 1. Aggressive secondary prevention. 2. Follow up with primary cardiologist. Complications: ?? None ___________________________________________________________________________ _ Referring Physician: Jacquelyne Balint, MD Primary Care Physician:  [email or web address] Primary Cardiologist: Jacquelyne Balint, MD Performing Attending:  Jacquelyne Balint MD Diagnostic Fellow: Shelba Flake, MD Interventional Fellow: Lyndee Hensen, MD Procedures performed:  Left Heart Catheterization and Coronary Angiography Access Site(s): right radial artery  Arterial Closure: TR Band Contrast Volume: 90   mL Fluoroscopy Time: Fluoro time: 4.8, 0, 0 minutes Radiation Dose: 633  mGy ___________________________________________________________________________ _ HISTORY: 58M h/o CAD s/p PCI mid-LAD 2011, last cath 07/2017 demonstrated 50% ISR presenting for LHC with ongoing chest pain concerning for angina. Trop negative x2. The procedure and risks were described to patient including risk of CVA, MI, bleeding, emergency surgery, damage to vasculature including coronary arteries, arrhythmia, renal failure and death. Consent was signed and witnessed. PROCEDURE: Left Heart Catheterization (right Radial) Under Lidocaine 2% local anesthesia, a 3F Terumo Glidesheath was placed in the right radial artery using modified Seldinger technique. 3 mg of verapamil were given via the sheath intra-arterially. A 0.035 Rosen wire (260cm) was used to guide the advancement of the coronary catheter for engagement. 5000 units of heparin were given after the wire and catheter crossed the aortic arch into the ascending aorta. Left heart catheterization was performed by advancing a 3F JR4 catheter across the aortic valve. The LVEDP was obtained. . Selective coronary angiography was then performed in multiple views by hand injections of Omnipaque using a 3F JR4 catheter to engage the right coronary artery and a 3F JL3.5 catheter to engage the left coronary artery. RADIAL ARTERY Following the procedure, 2 mg of verapamil were given via the sheath intra-arterially. The sheath was removed, and a TR band was applied to achieve hemostasis. The TR band was subsequently removed with staged removal of air as per protocol. The patient tolerated the procedure well without any complications. Hemodynamics and Left Heart Catheterization ?? Left ventricular filling pressure: 87/5 (LVEDP = 4  mmHg) Coronary Angiography Dominance: Right Left Main:  The left main coronary artery (LMCA) is a large-caliber vessel that originates from the left coronary sinus. It bifurcates into the left anterior descending (LAD) and left circumflex (LCx) arteries. There is no angiographic evidence of significant flow limiting disease in the LMCA. LAD:  The LAD is a large-caliber vessel that gives off 2 diagonal (D) branches before it wraps around the apex. D1 is a medium-caliber without evidence of obstructive CAD. There is 50% in stent restenosis of the mid LAD. D2 is a small-caliber vessel with no evidence of obstructive CAD.Marland Kitchen Left Circumflex:  The LCx is a large-caliber vessel that gives off 2 obtuse marginal (OM) branches and then continues as a  small vessel in the AV groove. There are multiple luminal irregularities throughout. OM1 is a medium-caliber tortuous branching vessel with a 40% ostial lesion and 40% stenotic proximal lesion. OM2 is a small-caliber vessel. There is no angiographic evidence of significant flow limiting disease in the LCx. Right Coronary:  The right coronary artery (RCA) is a large-caliber vessel originating from the right coronary sinus. It bifurcates distally into the posterior descending artery (PDA) and a posterolateral (PL) branch consistent with a right dominant system. There is no angiographic evidence of significant flow limiting disease in the RCA. Complications:  None Blood loss:  Minimal Specimen:  None Device/Implants: none Pre-procedure Dx:  Unstable angina, known CAD Post-procedure Dx:  Non obstructive CAD Images:  I personally spent 21 minutes continuously monitoring the patient during the administration of moderate sedation.  Pre and post sedation activities have been reviewed. ___________________________________________________________________________ Massie Maroon MD PGY-4 Cardiology Fellow Lyndee Hensen, MD Interventional Cardiology Fellow ___________________________________________________________________________ _ I was present for the entire duration of the procedure, as detailed above Jacquelyne Balint MD     ______________________________________________________________________  Discharge Instructions:   Activity Instructions     Activity as tolerated                Other Instructions     Call MD for:  difficulty breathing, headache or visual disturbances      Call MD for:  extreme fatigue      Call MD for:  hives      Call MD for:  persistent dizziness or light-headedness      Call MD for:  persistent nausea or vomiting      Call MD for:  redness, tenderness, or signs of infection (pain, swelling, redness, odor or green/yellow discharge around incision site)      Call MD for:  severe uncontrolled pain      Call MD for: Temperature > 38.5 Celsius ( > 101.3 Fahrenheit)      Discharge instructions      Discharge instructions template    You were admitted to [facility] for chest pain. We completed a heart cath and your coronaries did not require stenting. We also checked your legs for a clot and were unable to find one. We think the chest pain was potentially from your heart but your lungs could also be contributing, especially since you have been off your COPD meds. We have started some new meds for your cholesterol as it was high. You need to get labs drawn with your PCP next week. You need to follow-up with pulmonology ASAP. You should stop using chewing tobacco and we encourage you to keep attempting weight loss. Your oxygen saturation remained great while you were with [place] so you likely don't need as much oxygen. You should follow up with your pulmonologist as soon as possible to get a 6 minute walk test, PFT's and follow-up with your inhalers.     Please carefully read and follow these instructions below upon your discharge:    1) Please take your medications as prescribed and note the changes listed on your discharge. At future follow-up appointments, please be sure to take all of your medications with you so your provider can better guide your care.     New Meds:  - Zetia - nightly - this well help with cholesterol   - Repatha - we will work with your cardiologist to help get this for your cholesterol as well   - Incruse Elipta - similar to your  old inhaler, but on your formulary. You should take both as prescribed.   - Fluticasone/Salmeterol discuss- Similar to your old inhaler, but on your formulary  - Donepezil - I would reduce your dose to 10mg . 20mg  is likely too high and offers little benefit.       2) Seek medical care with your primary care doctor or local Emergency Room or Urgent Care if you develop any changes in your mental status, worsening abdominal pain, fevers greater than 101.5, any unexplained/unrelieved shortness of breath, uncontrolled nausea and vomiting that keeps you from remaining hydrated or taking your medication, or any other concerning symptoms.     3) Please go to your follow-up appointments. Some of your follow-up appointments have been listed below. If you do not see an appointment listed below with your primary care doctor, please call your doctor's office as soon as possible to schedule an appointment to be seen within 7-10 days of discharge.   - Cardiology f/u appt is : 9/22   - We will send a message to get you scheduled with our pulmonology group. You need to be seen by them ASAP  - You should follow-up with your PCP. We will inform them you were here. You need repeat labs in one week.     4) If you have any concerns before you are able to follow-up with your primary care doctor, you can reach [place] by calling [phone] and asking to page the Med C  resident on call.               Follow Up instructions and Outpatient Referrals     Call MD for:  difficulty breathing, headache or visual disturbances      Call MD for:  extreme fatigue      Call MD for:  hives      Call MD for:  persistent dizziness or light-headedness      Call MD for:  persistent nausea or vomiting      Call MD for:  redness, tenderness, or signs of infection (pain, swelling, redness, odor or green/yellow discharge around incision site)      Call MD for:  severe uncontrolled pain      Call MD for: Temperature > 38.5 Celsius ( > 101.3 Fahrenheit)      Discharge instructions            Appointments which have been scheduled for you    Nov 09, 2018  3:55 PM EDT  (Arrive by 3:40 PM)  Danelle Earthly RETURN with Rodman Key, MD  [facility] [address] [place] [facility]) [address]  [address]   [place] [address]  [phone]           ______________________________________________________________________  Discharge Day Services:  BP 138/71  - Pulse 73  - Temp 36.6 ??C (Oral)  - Resp 18  - Ht 182.9 cm (6')  - Wt (!) 141 kg (310 lb 13.6 oz)  - SpO2 97%  - BMI 42.16 kg/m??   Pt seen on the day of discharge and determined appropriate for discharge.    Condition at Discharge: good    Length of Discharge: I spent greater than 30 mins in the discharge of this patient.

## 2018-08-30 ENCOUNTER — Encounter
Admit: 2018-08-30 | Discharge: 2018-08-31 | Disposition: A | Discharge status: Home / Self Care | Payer: MEDICARE | Attending: Emergency Medicine

## 2018-08-30 ENCOUNTER — Ambulatory Visit
Admit: 2018-08-30 | Discharge: 2018-08-31 | Disposition: A | Discharge status: Home / Self Care | Payer: MEDICARE | Attending: Emergency Medicine

## 2018-08-30 DIAGNOSIS — R079 Chest pain, unspecified: Principal | ICD-10-CM

## 2018-08-30 DIAGNOSIS — J9811 Atelectasis: Secondary | ICD-10-CM | POA: Diagnosis not present

## 2018-08-30 DIAGNOSIS — J449 Chronic obstructive pulmonary disease, unspecified: Secondary | ICD-10-CM | POA: Diagnosis not present

## 2018-08-30 DIAGNOSIS — Z955 Presence of coronary angioplasty implant and graft: Secondary | ICD-10-CM | POA: Diagnosis not present

## 2018-08-30 DIAGNOSIS — R61 Generalized hyperhidrosis: Secondary | ICD-10-CM | POA: Diagnosis not present

## 2018-08-30 DIAGNOSIS — E119 Type 2 diabetes mellitus without complications: Secondary | ICD-10-CM | POA: Diagnosis not present

## 2018-08-30 DIAGNOSIS — R072 Precordial pain: Secondary | ICD-10-CM | POA: Diagnosis not present

## 2018-08-30 DIAGNOSIS — I110 Hypertensive heart disease with heart failure: Secondary | ICD-10-CM | POA: Diagnosis not present

## 2018-08-30 DIAGNOSIS — K219 Gastro-esophageal reflux disease without esophagitis: Secondary | ICD-10-CM | POA: Diagnosis not present

## 2018-08-30 DIAGNOSIS — R918 Other nonspecific abnormal finding of lung field: Secondary | ICD-10-CM | POA: Diagnosis not present

## 2018-08-30 DIAGNOSIS — R110 Nausea: Secondary | ICD-10-CM | POA: Diagnosis not present

## 2018-08-30 DIAGNOSIS — I5030 Unspecified diastolic (congestive) heart failure: Secondary | ICD-10-CM | POA: Diagnosis not present

## 2018-08-30 DIAGNOSIS — E785 Hyperlipidemia, unspecified: Secondary | ICD-10-CM | POA: Diagnosis not present

## 2018-08-30 DIAGNOSIS — Z86711 Personal history of pulmonary embolism: Secondary | ICD-10-CM | POA: Diagnosis not present

## 2018-08-30 DIAGNOSIS — I2510 Atherosclerotic heart disease of native coronary artery without angina pectoris: Secondary | ICD-10-CM | POA: Diagnosis not present

## 2018-08-30 DIAGNOSIS — Z9981 Dependence on supplemental oxygen: Secondary | ICD-10-CM | POA: Diagnosis not present

## 2018-08-30 DIAGNOSIS — J811 Chronic pulmonary edema: Secondary | ICD-10-CM | POA: Diagnosis not present

## 2018-08-30 DIAGNOSIS — G4733 Obstructive sleep apnea (adult) (pediatric): Secondary | ICD-10-CM | POA: Diagnosis not present

## 2018-08-30 LAB — CBC W/ AUTO DIFF
BASOPHILS ABSOLUTE COUNT: 0 10*9/L (ref 0.0–0.1)
BASOPHILS RELATIVE PERCENT: 0.3 %
EOSINOPHILS ABSOLUTE COUNT: 0.3 10*9/L (ref 0.0–0.4)
EOSINOPHILS RELATIVE PERCENT: 3.3 %
HEMATOCRIT: 37.4 % — ABNORMAL LOW (ref 41.0–53.0)
HEMOGLOBIN: 12.1 g/dL — ABNORMAL LOW (ref 13.5–17.5)
LARGE UNSTAINED CELLS: 2 % (ref 0–4)
LYMPHOCYTES ABSOLUTE COUNT: 1.2 10*9/L — ABNORMAL LOW (ref 1.5–5.0)
LYMPHOCYTES RELATIVE PERCENT: 12.1 %
MEAN CORPUSCULAR HEMOGLOBIN CONC: 32.2 g/dL (ref 31.0–37.0)
MEAN CORPUSCULAR HEMOGLOBIN: 28.2 pg (ref 26.0–34.0)
MEAN CORPUSCULAR VOLUME: 87.4 fL (ref 80.0–100.0)
MEAN PLATELET VOLUME: 8.5 fL (ref 7.0–10.0)
MONOCYTES ABSOLUTE COUNT: 0.4 10*9/L (ref 0.2–0.8)
MONOCYTES RELATIVE PERCENT: 3.5 %
NEUTROPHILS ABSOLUTE COUNT: 8.1 10*9/L — ABNORMAL HIGH (ref 2.0–7.5)
NEUTROPHILS RELATIVE PERCENT: 79 %
PLATELET COUNT: 257 10*9/L (ref 150–440)
RED BLOOD CELL COUNT: 4.28 10*12/L — ABNORMAL LOW (ref 4.50–5.90)
RED CELL DISTRIBUTION WIDTH: 16.6 % — ABNORMAL HIGH (ref 12.0–15.0)

## 2018-08-30 LAB — MAGNESIUM: Magnesium:MCnc:Pt:Ser/Plas:Qn:: 1.6

## 2018-08-30 LAB — TROPONIN I
Troponin I.cardiac:MCnc:Pt:Ser/Plas:Qn:: <0.034
Troponin I.cardiac:MCnc:Pt:Ser/Plas:Qn:: <0.034

## 2018-08-30 LAB — COMPREHENSIVE METABOLIC PANEL
ALBUMIN: 3.8 g/dL (ref 3.5–5.0)
ALKALINE PHOSPHATASE: 94 U/L (ref 38–126)
ALT (SGPT): 36 U/L (ref <50)
ANION GAP: 14 mmol/L (ref 7–15)
AST (SGOT): 35 U/L (ref 19–55)
BILIRUBIN TOTAL: 0.4 mg/dL (ref 0.0–1.2)
BLOOD UREA NITROGEN: 21 mg/dL (ref 7–21)
BUN / CREAT RATIO: 18
CALCIUM: 8.5 mg/dL (ref 8.5–10.2)
CO2: 27 mmol/L (ref 22.0–30.0)
CREATININE: 1.15 mg/dL (ref 0.70–1.30)
EGFR CKD-EPI AA MALE: 79 mL/min/{1.73_m2} (ref >=60)
EGFR CKD-EPI NON-AA MALE: 68 mL/min/{1.73_m2} (ref >=60)
GLUCOSE RANDOM: 177 mg/dL (ref 70–179)
POTASSIUM: 4.4 mmol/L (ref 3.5–5.0)
PROTEIN TOTAL: 7.3 g/dL (ref 6.5–8.3)
SODIUM: 144 mmol/L (ref 135–145)

## 2018-08-30 LAB — PRO-BNP: Natriuretic peptide.B prohormone N-Terminal:MCnc:Pt:Ser/Plas:Qn:: 218 — ABNORMAL HIGH

## 2018-08-30 LAB — HEMOGLOBIN: Hemoglobin:MCnc:Pt:Bld:Qn:: 12.1 — ABNORMAL LOW

## 2018-08-30 LAB — EGFR CKD-EPI NON-AA MALE: Lab: 68

## 2018-08-30 LAB — D-DIMER QUANTITATIVE (CH,ML,PD,ET): Lab: 345 — ABNORMAL HIGH

## 2018-08-30 MED ORDER — FAMOTIDINE 40 MG TABLET
ORAL_TABLET | Freq: Two times a day (BID) | ORAL | 3 refills | 10 days | Status: CP | PRN
Start: 2018-08-30 — End: 2018-09-04

## 2018-08-30 MED ORDER — LIDOCAINE HCL 2 % MUCOSAL SOLUTION
OROMUCOSAL | 0 refills | 5.00000 days | Status: CP
Start: 2018-08-30 — End: 2018-08-30

## 2018-08-30 MED ORDER — LIDOCAINE HCL 2 % MUCOSAL SOLUTION: 10 mL | mL | 0 refills | 5 days | Status: AC

## 2018-08-30 NOTE — Unmapped (Signed)
Chest pain, clean cardiac catheterization on Saturday.

## 2018-08-30 NOTE — Unmapped (Signed)
.  Patient rounding complete, call bell in reach, bed locked and in lowest position, patient belongings at bedside and within reach of patient.  Patient updated on plan of care.      Patient brought by EMS. Nurse Felicity Pellegrini RN in the room with patient.

## 2018-08-30 NOTE — Unmapped (Signed)
Bed: 21-B  Expected date:   Expected time:   Means of arrival:   Comments:  ems

## 2018-08-31 MED ORDER — EMPTY CONTAINER
3 refills | 0 days
Start: 2018-08-31 — End: ?

## 2018-08-31 NOTE — Unmapped (Signed)
ED Progress Note    I assumed care of this patient at approximately 5 PM.  The patient is a 63 year old with obesity, CAD with stent placement x2 who presents with substernal chest pain that began this morning.  Please see full note by Bluford Kaufmann, PA.    I do note that patient had a similar present 2 days ago and has undergone cardiac catheterization.    I note that at time of signout, patient was given tramadol for pain.  CTA pending.  Consider COVID testing.    7:45 PM  CTA chest has been resulted and it is noted that there is mild interstitial pulmonary edema.  I note proBNP is 218.      Ref Range & Units 08/30/18 1416 08/26/18 0556 01/03/18 1154 09/03/17 0928    PRO-BNP 0.0 - 177.0 pg/mL 218.0High   160.0  467.0High   42.7        7:50 PM  Patient evaluated at this time.  Seems quite concerned about the chest pain which she notes to be substernal and tight and he states he is worried about a blockage.  Nitroglycerin did not improve pain and he states no relief with tramadol.  I will page cardiology with regard to patient's presentation.    Denies any other red flag COVID-19 symptoms at this time.    7:57 PM  Cardiology discussion with review of patient's presentation and evaluation and they state do not feel ACS or other emergent condition and plan for GI cocktail, Pepcid and Covid testing.    11/09/2018 Cardiology appointent upcoming.  Cardiology states that patient can be seen [facility] by [facility] next week.  He should call for this appointment.    8:04 PM  Patient requesting something stronger for pain management and notes that morphine was helpful last time.  Will treat with a one-time dose of morphine.  We will continue to follow and monitor.  Anticipate discharged home.    10:23 PM  Patient has significant relief and is now pain-free with GI cocktail and Pepcid.  States he still has a bit of nausea noting that these medication caused nausea.  Plan for discharge to home.    Precautions given for return. Discussed my evaluation of the patient's symptoms, my clinical impression, and my proposed outpatient treatment plan with the patient. We have discussed anticipatory guidance, scheduled follow-up, and careful return precautions.  The patient expresses understanding, is agreeable, and comfortable with the discharge plan. Denies any questions or concerns at this time. NAD noted upon discharge.       Xr Chest 2 Views    Result Date: 08/30/2018  EXAM: XR CHEST 2 VIEWS DATE: 08/30/2018 at 1426 hours ACCESSION: [id number] UN DICTATED: 08/30/2018 2:49 PM INTERPRETATION LOCATION: [facility] CLINICAL INDICATION: 63 years old Male with CHEST PAIN  COMPARISON: Chest x-ray from 08/26/2018 TECHNIQUE: PA and Lateral Chest Radiographs. FINDINGS: Hypoinflated lungs. Bibasilar opacities, likely atelectasis. No pleural effusion or pneumothorax. Enlarged cardiomediastinal silhouette in the setting of hypoinflation and technique.     Hypoinflated lungs with bibasilar opacities, likely atelectasis.    Cta Chest W Contrast    Result Date: 08/30/2018  EXAM: CTA CHEST W CONTRAST DATE: 08/30/2018 6:57 PM ACCESSION: [id number] UN DICTATED: 08/30/2018 7:17 PM INTERPRETATION LOCATION: [facility] CLINICAL INDICATION: 63 years old Male with chest pain, hx PE ; PE suspected, high pretest prob  COMPARISON: Same day chest radiograph. Chest CT 04/09/2016, 01/01/2016. TECHNIQUE: A spiral CTA scan was obtained with IV contrast from  the lung apices to the lung bases. Images were reconstructed in the axial plane. Multiplanar reformatted and MIP images were provided. FINDINGS: PULMONARY ARTERIES: No pulmonary embolism. AIRWAYS, LUNGS, PLEURA: Clear central tracheobronchial tree. Calcified right middle lobe granuloma (4:80). Mild bilateral peribronchial thickening and paracentral predominant groundglass opacities with interlobular septal thickening. Dependent atelectasis along the fissures and bilateral lower lobes. No pleural effusion. MEDIASTINUM: Normal heart size. No pericardial effusion. Coronary artery calcifications. Normal caliber thoracic aorta.  Fairly prominent benign-appearing mediastinal lymph nodes, stable since 2017. IMAGED ABDOMEN: Unremarkable. SOFT TISSUES: Unremarkable. BONES: Multilevel degenerative change of the spine.     No evidence of pulmonary embolism. Mild interstitial pulmonary edema. ATTENDING ADDENDUM BY DR. Margit Banda ON 08/30/2018 AT 7:51 PM: No pulmonary edema. Lung parenchymal mosaicism, nonspecific and can be seen with small airways disease.      Date of Procedure: August 27, 2018  ____________________________________________________________________________  Findings:  1. No obstructive coronary disease.   2.   Normal left ventricular filling pressures (LVEDP = 4  mmHg)  ??  Recommendations:  1. Aggressive secondary prevention.  2. Follow up with primary cardiologist.  ??  Complications:  ?? None  ____________________________________________________________________________  Referring Physician: Jacquelyne Balint, MD  Primary Care Physician:  Eloisa Northern  Primary Cardiologist: Jacquelyne Balint, MD    Coronary Angiography  ??  Dominance: Right  ??  Left Main:  The left main coronary artery (LMCA) is a large-caliber vessel that originates from the left coronary sinus. It bifurcates into the left anterior descending (LAD) and left circumflex (LCx) arteries. There is no angiographic evidence of significant flow limiting disease in the LMCA.  ??  LAD:  The LAD is a large-caliber vessel that gives off 2 diagonal (D) branches before it wraps around the apex. D1 is a medium-caliber without evidence of obstructive CAD. There is 50% in stent restenosis of the mid LAD. D2 is a small-caliber vessel with no evidence of obstructive CAD..   ??  Left Circumflex:  The LCx is a large-caliber vessel that gives off 2 obtuse marginal (OM) branches and then continues as a small vessel in the AV groove. There are multiple luminal irregularities throughout. OM1 is a medium-caliber tortuous branching vessel with a 40% ostial lesion and 40% stenotic proximal lesion. OM2 is a small-caliber vessel. There is no angiographic evidence of significant flow limiting disease in the LCx.

## 2018-08-31 NOTE — Unmapped (Signed)
Was paged by the ER provider regarding Mr. Thomas Owens.  Briefly he is a 63 year old man with a history of HFpEF, HTN, HLD, CAD s/p 2 stents in 2011, COPD (on 5L home O2), OSA, PE (2017, not on [facility]), DM and a history of ASD/PFO s/p closure who presented to the ER with chest pain.  He was recently admitted to the [facility] service a few days ago from 7/9 to 7/11 for chest pain.  He underwent left heart catheterization at that admission which showed nonobstructive coronary disease including a 50% in-stent restenosis of the mid LAD which had been noted previously.  His chest pain improved significantly after his left heart catheterization though this was felt to be possibly related to poorly controlled COPD and potentially microvascular ischemia for which she is already taking Imdur as well as metoprolol tartrate.  In the ER his work-up is been unremarkable from cardiac perspective given normal troponins and his ECG does not show dynamic changes or changes that are significantly different compared to prior ECGs.  He had CTA performed which showed no PE or other acute chest processes.  While have not seen the patient feel that the etiology of his chest pain is less likely to be cardiac especially in light of a catheterization only few days ago which did not demonstrate high-grade lesions.  The ER provider stated that they would plan to do a COVID test which is that was reasonable.  I also agree that attempting treatment for potential GI cause would also be reasonable at this time as well.  I recommend they call back with further questions or concerns.

## 2018-08-31 NOTE — Unmapped (Signed)
[facility]   Patient Onboarding/Medication Counseling    Thomas Owens is a 63 y.o. male with hyperlipidemia who I am counseling today on initiation of therapy.  I am speaking to the patient.    Verified patient's date of birth / HIPAA.    Specialty medication(s) to be sent: General Specialty: Repatha      Non-specialty medications/supplies to be sent: sharps      Medications not needed at this time: none         Repatha (evolocumab)    Medication & Administration     Dosage: Inject the contents of 1 pen (140mg ) under the skin every 2 weeks.    Administration: Administer under the skin of the abdomen, thigh or upper arm. Rotate sites with each injection.  ? Injection instructions - Autoinjector:  o Remove 1 Repatha autoinjector from the refrigerator and let stand at room temperature for at least 30 minutes.  o Check the autoinjector for the following:  ? Expiration date  ? Absence of any cracks or damage  ? The medicine is clear and colorless and does not contain any particles  ? The orange cap is present and securely attached  o Choose your injection site and clean with an alcohol wipe. Allow to air dry completely.  o Pull the orange cap straight off and discard  o Pinch the skin (or stretch) with your thumb and fingers creating an area 2 inches wide  o Maintaining the pinch (or stretch) press the pen to your skin at a 90 degree angle. Firmly push the autoinjector down until the skin stops moving and the yellow safety guard is no longer visible.  o Do not touch the gray start button yet  o When you are ready to inject, press the gray start button. You will hear a click that signals the start of the injection  o Continue to press the pen to your skin and lift your thumb  o The injection may take up to 15 seconds. You will know the injection is complete when the medication window turns yellow. You may also hear a second click.  o Remove the pen from your skin and discard the pen in a sharps container.  o If there is blood at the injection site, press a cotton ball or gauze to the site. Do not rub the injection site.    Adherence/Missed dose instructions: Administer a missed dose within 7 days and resume your normal schedule.  If it has been more than 7 days and you inject every 2 weeks, skip the missed dose and resume your normal schedule..     Goals of Therapy     Lower cholesterol, prevention of cardiovascular events in patients with established cardiovascular disease    Side Effects & Monitoring Parameters   ? Flu-like symptoms  ? Signs of a common cold  ? Back pain  ? Injection site irritation  ? Nose or throat irritation    The following side effects should be reported to the provider:  ? Signs of an allergic reaction  ? Signs of high blood sugar (confusion, drowsiness, increase thirst/hunger/urination, fast breathing, flushing)      Contraindications, Warnings, & Precautions     ? Latex (the packaging of Repatha may contain natural rubber)    Drug/Food Interactions     ? Medication list reviewed in Epic. The patient was instructed to inform the care team before taking any new medications or supplements. No drug interactions identified.  Storage, Handling Precautions, & Disposal   ? Repatha should be stored in the refrigerator. If necessary, Repatha may be kept at room temperature for no more than 30 days.  ? Place used devices in a sharps container for disposal.      Current Medications (including OTC/herbals), Comorbidities and Allergies     Current Outpatient Medications   Medication Sig Dispense Refill   ??? albuterol HFA 90 mcg/actuation inhaler Inhale 2 puffs every six (6) hours as needed for wheezing. 1 Inhaler 11   ??? aspirin (ECOTRIN) 81 MG tablet Take 81 mg by mouth daily.     ??? BD VEO INSULIN SYRINGE UF 1/2 mL 31 gauge x 15/64 Syrg      ??? bethanechol (URECHOLINE) 10 MG tablet Take 10 mg by mouth Two (2) times a day.      ??? buPROPion (WELLBUTRIN XL) 300 MG 24 hr tablet Take 300 mg by mouth daily.      ??? cholecalciferol, vitamin D3, (VITAMIN D3) 1,000 unit capsule Take 1,000 Units by mouth daily.     ??? clopidogrel (PLAVIX) 75 mg tablet Take 75 mg by mouth daily.     ??? diphenhydrAMINE (BENADRYL) 25 mg capsule/tablet Take 25 mg by mouth every six (6) hours as needed for itching.     ??? donepeziL (ARICEPT) 10 MG tablet Take 1 tablet (10 mg total) by mouth nightly.  0   ??? evolocumab 140 mg/mL PnIj Inject the contents of 1 pen (140 mg) under the skin every fourteen (14) days. 4 mL 1   ??? ezetimibe (ZETIA) 10 mg tablet Take 1 tablet (10 mg total) by mouth nightly. 30 tablet 2   ??? famotidine (PEPCID) 40 MG tablet Take 1 tablet (40 mg total) by mouth two (2) times a day as needed for heartburn for up to 5 days. 20 tablet 3   ??? fluticasone propion-salmeteroL (ADVAIR) 250-50 mcg/dose diskus Inhale 1 puff 2 (two) times a day. 60 each 1   ??? gabapentin (NEURONTIN) 300 MG capsule Take 600 mg by mouth Four (4) times a day.     ??? insulin NPH (HUMULIN,NOVOLIN) 100 unit/mL injection Inject 10-15 Units under the skin daily.      ??? isosorbide mononitrate (IMDUR) 30 MG 24 hr tablet Take 3 tablets (90 mg total) by mouth daily. 90 tablet 3   ??? lidocaine 2% viscous (LIDOCAINE) 2 % Soln 10 mL by Mouth route Every three (3) hours for 5 days. Mix 1/2 and 1/2 with Maalox 400 mL 0   ??? lisinopril (PRINIVIL,ZESTRIL) 10 MG tablet Take 10 mg by mouth daily.     ??? loperamide (IMODIUM) 2 mg capsule Take 2 mg by mouth 4 (four) times a day as needed for diarrhea.     ??? metFORMIN (GLUCOPHAGE) 1000 MG tablet Take 1,000 mg by mouth 2 (two) times a day with meals.      ??? metoprolol tartrate (LOPRESSOR) 100 MG tablet Take 100 mg by mouth Two (2) times a day.     ??? nitroglycerin (NITROSTAT) 0.4 MG SL tablet Place 1 tablet (0.4 mg total) under the tongue every five (5) minutes as needed for chest pain. 25 tablet 3   ??? oxyCODONE-acetaminophen (PERCOCET) 10-325 mg per tablet Take 1 tablet by mouth Every six (6) hours.      ??? pantoprazole (PROTONIX) 40 MG tablet Take 40 mg by mouth daily.     ??? rosuvastatin (CRESTOR) 40 MG tablet Take 40 mg by mouth daily.     ???  sertraline (ZOLOFT) 100 MG tablet Take 100 mg by mouth daily.     ??? tamsulosin (FLOMAX) 0.4 mg capsule Take 0.8 mg by mouth nightly.      ??? torsemide (DEMADEX) 20 MG tablet TAKE 2 TABLETS BY MOUTH ON MONDAY, WEDNESDAY AND FRIDAY AND 3 TABLETS ON ALL OTHER DAYS 48 each 2   ??? umeclidinium (INCRUSE ELLIPTA) 62.5 mcg/actuation inhaler Inhale 1 puff once daily. 30 each 1     No current facility-administered medications for this visit.        Allergies   Allergen Reactions   ??? Codeine Swelling       Patient Active Problem List   Diagnosis   ??? Coronary artery disease involving native coronary artery of native heart   ??? Chronic heart failure (CMS-HCC)   ??? Hypertensive heart disease with heart failure (CMS-HCC)   ??? Hyperlipidemia LDL goal <100 (RAF-HCC)   ??? Preoperative cardiovascular examination   ??? Chest pain   ??? Morbid obesity (CMS-HCC)   ??? Other pulmonary embolism with acute cor pulmonale (CMS-HCC)   ??? Other pulmonary embolism without acute cor pulmonale (CMS-HCC)   ??? Diabetes mellitus (CMS-HCC)   ??? Pulmonary hypertension (CMS-HCC)   ??? Obesity hypoventilation syndrome (CMS-HCC)   ??? Obstructive sleep apnea   ??? HTN (hypertension)   ??? GERD (gastroesophageal reflux disease)   ??? Dyslipidemia   ??? CKD (chronic kidney disease) stage 3, GFR 30-59 ml/min (CMS-HCC)   ??? Depression       Reviewed and up to date in Epic.    Appropriateness of Therapy     Is medication and dose appropriate based on diagnosis? Yes    Baseline Quality of Life Assessment      How many days over the past month did your hyperlipidemia keep you from your normal activities? 0    Financial Information     Medication Assistance provided: Prior Authorization and [employer]    Anticipated copay of $0 reviewed with patient. Verified delivery address.    Delivery Information     Scheduled delivery date: 09/01/18    Expected start date: 09/01/18    Medication will be delivered via Same Day Courier to the home address in [place].  This shipment will not require a signature.      Explained the services we provide at [facility] and that each month we would call to set up refills.  Stressed importance of returning phone calls so that we could ensure they receive their medications in time each month.  Informed patient that we should be setting up refills 7-10 days prior to when they will run out of medication.  A pharmacist will reach out to perform a clinical assessment periodically.  Informed patient that a welcome packet and a drug information handout will be sent.      Patient verbalized understanding of the above information as well as how to contact the pharmacy at [phone] option 4 with any questions/concerns.  The pharmacy is open Monday through Friday 8:30am-4:30pm.  A pharmacist is available 24/7 via pager to answer any clinical questions they may have.    Patient Specific Needs     ? Does the patient have any physical, cognitive, or cultural barriers? No    ? Patient prefers to have medications discussed with  Patient     ? Is the patient able to read and understand education materials at a high school level or above? Yes    ? Patient's primary  language is  English     ? Is the patient high risk? No     ? Does the patient require a Care Management Plan? No     ? Does the patient require physician intervention or other additional services (i.e. nutrition, smoking cessation, social work)? No      Arnold Long  [facility] Specialty Pharmacist

## 2018-08-31 NOTE — Unmapped (Signed)
[facility] Specialty Medication Referral: Financial Assistance Approved      Medication (Brand/Generic): REPATHA    Final Test Claim completed with resulted information below:    Patient ABLE to fill at [facility]  Insurance Company:  [employer] MEDICARE PART D  Anticipated Copay: $0  Is anticipated copay with a copay card or grant? Yes, there was a grant approved for this patient for this medication.     Does this patient have to receive a partial fill of the medication due to insurance restrictions? NO  If so, please cofirm how many days supply is allowed per plan per fill and how long the patient will have to fill partial months supply for the medication: NOT APPLICABLE     If the copay is under the $25 defined limit, per policy there will be no further investigation of need for financial assistance at this time unless patient requests. This referral has been communicated to the provider and handed off to the [facility] team for further processing and filling of prescribed medication.   ______________________________________________________________________  Please utilize this referral for viewing purposes as it will serve as the central location for all relevant documentation and updates.

## 2018-09-01 DIAGNOSIS — J449 Chronic obstructive pulmonary disease, unspecified: Secondary | ICD-10-CM | POA: Diagnosis not present

## 2018-09-01 MED FILL — EMPTY CONTAINER: 120 days supply | Qty: 1 | Fill #0 | Status: AC

## 2018-09-01 MED FILL — REPATHA SURECLICK 140 MG/ML SUBCUTANEOUS PEN INJECTOR: 28 days supply | Qty: 2 | Fill #0 | Status: AC

## 2018-09-01 MED FILL — EMPTY CONTAINER: 120 days supply | Qty: 1 | Fill #0

## 2018-09-06 DIAGNOSIS — I2510 Atherosclerotic heart disease of native coronary artery without angina pectoris: Secondary | ICD-10-CM | POA: Diagnosis not present

## 2018-09-06 DIAGNOSIS — R072 Precordial pain: Secondary | ICD-10-CM | POA: Diagnosis not present

## 2018-09-06 DIAGNOSIS — E782 Mixed hyperlipidemia: Secondary | ICD-10-CM | POA: Diagnosis not present

## 2018-09-15 DIAGNOSIS — I10 Essential (primary) hypertension: Secondary | ICD-10-CM | POA: Diagnosis not present

## 2018-09-20 DIAGNOSIS — E1169 Type 2 diabetes mellitus with other specified complication: Secondary | ICD-10-CM | POA: Diagnosis not present

## 2018-09-20 DIAGNOSIS — M545 Low back pain: Secondary | ICD-10-CM | POA: Diagnosis not present

## 2018-09-20 DIAGNOSIS — G8929 Other chronic pain: Secondary | ICD-10-CM | POA: Diagnosis not present

## 2018-09-20 DIAGNOSIS — M25561 Pain in right knee: Secondary | ICD-10-CM | POA: Diagnosis not present

## 2018-09-20 DIAGNOSIS — Z5181 Encounter for therapeutic drug level monitoring: Secondary | ICD-10-CM | POA: Diagnosis not present

## 2018-09-26 DIAGNOSIS — J9612 Chronic respiratory failure with hypercapnia: Secondary | ICD-10-CM | POA: Diagnosis not present

## 2018-09-29 MED FILL — REPATHA SURECLICK 140 MG/ML SUBCUTANEOUS PEN INJECTOR: 28 days supply | Qty: 2 | Fill #1 | Status: AC

## 2018-09-29 MED FILL — REPATHA SURECLICK 140 MG/ML SUBCUTANEOUS PEN INJECTOR: SUBCUTANEOUS | 28 days supply | Qty: 2 | Fill #1

## 2018-10-02 DIAGNOSIS — J449 Chronic obstructive pulmonary disease, unspecified: Secondary | ICD-10-CM | POA: Diagnosis not present

## 2018-10-16 DIAGNOSIS — I10 Essential (primary) hypertension: Secondary | ICD-10-CM | POA: Diagnosis not present

## 2018-10-20 DIAGNOSIS — Z5181 Encounter for therapeutic drug level monitoring: Secondary | ICD-10-CM | POA: Diagnosis not present

## 2018-10-20 DIAGNOSIS — M545 Low back pain: Secondary | ICD-10-CM | POA: Diagnosis not present

## 2018-10-20 DIAGNOSIS — E782 Mixed hyperlipidemia: Secondary | ICD-10-CM | POA: Diagnosis not present

## 2018-10-20 DIAGNOSIS — G8929 Other chronic pain: Secondary | ICD-10-CM | POA: Diagnosis not present

## 2018-10-20 DIAGNOSIS — M25561 Pain in right knee: Secondary | ICD-10-CM | POA: Diagnosis not present

## 2018-10-20 DIAGNOSIS — E1169 Type 2 diabetes mellitus with other specified complication: Secondary | ICD-10-CM | POA: Diagnosis not present

## 2018-10-20 DIAGNOSIS — Z23 Encounter for immunization: Secondary | ICD-10-CM | POA: Diagnosis not present

## 2018-10-27 DIAGNOSIS — J9612 Chronic respiratory failure with hypercapnia: Secondary | ICD-10-CM | POA: Diagnosis not present

## 2018-10-28 MED FILL — REPATHA SURECLICK 140 MG/ML SUBCUTANEOUS PEN INJECTOR: 28 days supply | Qty: 2 | Fill #2 | Status: AC

## 2018-10-28 MED FILL — REPATHA SURECLICK 140 MG/ML SUBCUTANEOUS PEN INJECTOR: SUBCUTANEOUS | 28 days supply | Qty: 2 | Fill #2

## 2018-11-02 DIAGNOSIS — J449 Chronic obstructive pulmonary disease, unspecified: Secondary | ICD-10-CM | POA: Diagnosis not present

## 2018-11-09 ENCOUNTER — Encounter: Admit: 2018-11-09 | Discharge: 2018-11-10 | Payer: MEDICARE

## 2018-11-09 DIAGNOSIS — I5033 Acute on chronic diastolic (congestive) heart failure: Secondary | ICD-10-CM

## 2018-11-09 DIAGNOSIS — I5030 Unspecified diastolic (congestive) heart failure: Secondary | ICD-10-CM | POA: Diagnosis not present

## 2018-11-09 DIAGNOSIS — E785 Hyperlipidemia, unspecified: Secondary | ICD-10-CM | POA: Diagnosis not present

## 2018-11-09 DIAGNOSIS — I2510 Atherosclerotic heart disease of native coronary artery without angina pectoris: Secondary | ICD-10-CM | POA: Diagnosis not present

## 2018-11-09 DIAGNOSIS — I10 Essential (primary) hypertension: Secondary | ICD-10-CM | POA: Diagnosis not present

## 2018-11-09 MED ORDER — NITROGLYCERIN 0.4 MG SUBLINGUAL TABLET
ORAL_TABLET | SUBLINGUAL | 3 refills | 1 days | Status: CP | PRN
Start: 2018-11-09 — End: 2019-11-09

## 2018-11-09 MED ORDER — ISOSORBIDE MONONITRATE ER 120 MG TABLET,EXTENDED RELEASE 24 HR
ORAL_TABLET | Freq: Every day | ORAL | 6 refills | 30 days | Status: CP
Start: 2018-11-09 — End: ?

## 2018-11-16 DIAGNOSIS — I10 Essential (primary) hypertension: Secondary | ICD-10-CM | POA: Diagnosis not present

## 2018-11-22 DIAGNOSIS — E1142 Type 2 diabetes mellitus with diabetic polyneuropathy: Secondary | ICD-10-CM | POA: Diagnosis not present

## 2018-11-22 DIAGNOSIS — Z1389 Encounter for screening for other disorder: Secondary | ICD-10-CM | POA: Diagnosis not present

## 2018-11-22 DIAGNOSIS — Z5181 Encounter for therapeutic drug level monitoring: Secondary | ICD-10-CM | POA: Diagnosis not present

## 2018-11-22 DIAGNOSIS — M545 Low back pain: Secondary | ICD-10-CM | POA: Diagnosis not present

## 2018-11-22 DIAGNOSIS — E782 Mixed hyperlipidemia: Secondary | ICD-10-CM | POA: Diagnosis not present

## 2018-11-22 DIAGNOSIS — Z0000 Encounter for general adult medical examination without abnormal findings: Secondary | ICD-10-CM | POA: Diagnosis not present

## 2018-11-22 DIAGNOSIS — I10 Essential (primary) hypertension: Secondary | ICD-10-CM | POA: Diagnosis not present

## 2018-11-22 MED FILL — REPATHA SURECLICK 140 MG/ML SUBCUTANEOUS PEN INJECTOR: 28 days supply | Qty: 2 | Fill #3 | Status: AC

## 2018-11-22 MED FILL — REPATHA SURECLICK 140 MG/ML SUBCUTANEOUS PEN INJECTOR: SUBCUTANEOUS | 28 days supply | Qty: 2 | Fill #3

## 2018-11-26 DIAGNOSIS — J9612 Chronic respiratory failure with hypercapnia: Secondary | ICD-10-CM | POA: Diagnosis not present

## 2018-12-02 DIAGNOSIS — J449 Chronic obstructive pulmonary disease, unspecified: Secondary | ICD-10-CM | POA: Diagnosis not present

## 2018-12-13 MED ORDER — SERTRALINE 100 MG TABLET
0.00000 days
Start: 2018-12-13 — End: ?

## 2018-12-16 DIAGNOSIS — I10 Essential (primary) hypertension: Secondary | ICD-10-CM | POA: Diagnosis not present

## 2018-12-21 DIAGNOSIS — E1169 Type 2 diabetes mellitus with other specified complication: Secondary | ICD-10-CM | POA: Diagnosis not present

## 2018-12-21 DIAGNOSIS — E782 Mixed hyperlipidemia: Secondary | ICD-10-CM | POA: Diagnosis not present

## 2018-12-21 DIAGNOSIS — N1831 Chronic kidney disease, stage 3a: Secondary | ICD-10-CM | POA: Diagnosis not present

## 2018-12-21 DIAGNOSIS — M545 Low back pain: Secondary | ICD-10-CM | POA: Diagnosis not present

## 2018-12-21 DIAGNOSIS — Z5181 Encounter for therapeutic drug level monitoring: Secondary | ICD-10-CM | POA: Diagnosis not present

## 2018-12-21 DIAGNOSIS — M25561 Pain in right knee: Secondary | ICD-10-CM | POA: Diagnosis not present

## 2018-12-21 DIAGNOSIS — G8929 Other chronic pain: Secondary | ICD-10-CM | POA: Diagnosis not present

## 2018-12-27 DIAGNOSIS — J9612 Chronic respiratory failure with hypercapnia: Secondary | ICD-10-CM | POA: Diagnosis not present

## 2018-12-30 DIAGNOSIS — I2510 Atherosclerotic heart disease of native coronary artery without angina pectoris: Principal | ICD-10-CM

## 2018-12-30 DIAGNOSIS — E785 Hyperlipidemia, unspecified: Principal | ICD-10-CM

## 2019-01-02 DIAGNOSIS — J449 Chronic obstructive pulmonary disease, unspecified: Secondary | ICD-10-CM | POA: Diagnosis not present

## 2019-01-03 MED ORDER — REPATHA SURECLICK 140 MG/ML SUBCUTANEOUS PEN INJECTOR
SUBCUTANEOUS | 1 refills | 56 days | Status: CP
Start: 2019-01-03 — End: ?
  Filled 2019-01-28: qty 2, 28d supply, fill #0

## 2019-01-16 ENCOUNTER — Encounter
Admit: 2019-01-16 | Discharge: 2019-01-17 | Disposition: A | Discharge status: Home / Self Care | Payer: MEDICARE | Attending: Emergency Medicine

## 2019-01-16 ENCOUNTER — Emergency Department
Admit: 2019-01-16 | Discharge: 2019-01-17 | Disposition: A | Discharge status: Home / Self Care | Payer: MEDICARE | Attending: Emergency Medicine

## 2019-01-16 DIAGNOSIS — R079 Chest pain, unspecified: Principal | ICD-10-CM

## 2019-01-16 DIAGNOSIS — Z96659 Presence of unspecified artificial knee joint: Principal | ICD-10-CM

## 2019-01-16 DIAGNOSIS — R911 Solitary pulmonary nodule: Secondary | ICD-10-CM | POA: Diagnosis not present

## 2019-01-16 DIAGNOSIS — E1142 Type 2 diabetes mellitus with diabetic polyneuropathy: Secondary | ICD-10-CM | POA: Diagnosis not present

## 2019-01-16 DIAGNOSIS — Z955 Presence of coronary angioplasty implant and graft: Secondary | ICD-10-CM | POA: Diagnosis not present

## 2019-01-16 DIAGNOSIS — M25562 Pain in left knee: Secondary | ICD-10-CM | POA: Diagnosis not present

## 2019-01-16 DIAGNOSIS — R531 Weakness: Secondary | ICD-10-CM | POA: Diagnosis not present

## 2019-01-16 DIAGNOSIS — Z743 Need for continuous supervision: Secondary | ICD-10-CM | POA: Diagnosis not present

## 2019-01-16 DIAGNOSIS — I2510 Atherosclerotic heart disease of native coronary artery without angina pectoris: Secondary | ICD-10-CM | POA: Diagnosis not present

## 2019-01-16 DIAGNOSIS — Z7982 Long term (current) use of aspirin: Secondary | ICD-10-CM | POA: Diagnosis not present

## 2019-01-16 DIAGNOSIS — R0789 Other chest pain: Secondary | ICD-10-CM | POA: Diagnosis not present

## 2019-01-16 DIAGNOSIS — R0602 Shortness of breath: Secondary | ICD-10-CM | POA: Diagnosis not present

## 2019-01-16 DIAGNOSIS — M79605 Pain in left leg: Secondary | ICD-10-CM | POA: Diagnosis not present

## 2019-01-16 DIAGNOSIS — Z885 Allergy status to narcotic agent status: Secondary | ICD-10-CM | POA: Diagnosis not present

## 2019-01-16 DIAGNOSIS — J449 Chronic obstructive pulmonary disease, unspecified: Secondary | ICD-10-CM | POA: Diagnosis not present

## 2019-01-16 DIAGNOSIS — R0902 Hypoxemia: Secondary | ICD-10-CM | POA: Diagnosis not present

## 2019-01-16 DIAGNOSIS — K5730 Diverticulosis of large intestine without perforation or abscess without bleeding: Secondary | ICD-10-CM | POA: Diagnosis not present

## 2019-01-16 DIAGNOSIS — E785 Hyperlipidemia, unspecified: Secondary | ICD-10-CM | POA: Diagnosis not present

## 2019-01-16 DIAGNOSIS — I77810 Thoracic aortic ectasia: Secondary | ICD-10-CM | POA: Diagnosis not present

## 2019-01-16 DIAGNOSIS — Z96652 Presence of left artificial knee joint: Secondary | ICD-10-CM | POA: Diagnosis not present

## 2019-01-16 DIAGNOSIS — D72829 Elevated white blood cell count, unspecified: Secondary | ICD-10-CM | POA: Diagnosis not present

## 2019-01-16 DIAGNOSIS — I5032 Chronic diastolic (congestive) heart failure: Secondary | ICD-10-CM | POA: Diagnosis not present

## 2019-01-16 DIAGNOSIS — I10 Essential (primary) hypertension: Secondary | ICD-10-CM | POA: Diagnosis not present

## 2019-01-16 DIAGNOSIS — I110 Hypertensive heart disease with heart failure: Secondary | ICD-10-CM | POA: Diagnosis not present

## 2019-01-16 MED ORDER — OXYCODONE 5 MG TABLET
ORAL_TABLET | ORAL | 0 refills | 2.00000 days | Status: CP | PRN
Start: 2019-01-16 — End: 2019-01-21

## 2019-01-19 ENCOUNTER — Encounter: Admit: 2019-01-19 | Discharge: 2019-01-22 | Payer: MEDICARE

## 2019-01-19 ENCOUNTER — Ambulatory Visit: Admit: 2019-01-19 | Discharge: 2019-01-22 | Payer: MEDICARE

## 2019-01-19 ENCOUNTER — Non-Acute Institutional Stay: Admit: 2019-01-19 | Discharge: 2019-01-22 | Payer: MEDICARE

## 2019-01-19 DIAGNOSIS — Z743 Need for continuous supervision: Secondary | ICD-10-CM | POA: Diagnosis not present

## 2019-01-19 DIAGNOSIS — Z794 Long term (current) use of insulin: Secondary | ICD-10-CM | POA: Diagnosis not present

## 2019-01-19 DIAGNOSIS — R55 Syncope and collapse: Secondary | ICD-10-CM | POA: Diagnosis not present

## 2019-01-19 DIAGNOSIS — R0902 Hypoxemia: Secondary | ICD-10-CM | POA: Diagnosis not present

## 2019-01-19 DIAGNOSIS — G4489 Other headache syndrome: Secondary | ICD-10-CM | POA: Diagnosis not present

## 2019-01-19 DIAGNOSIS — R0789 Other chest pain: Secondary | ICD-10-CM | POA: Diagnosis not present

## 2019-01-19 DIAGNOSIS — R42 Dizziness and giddiness: Secondary | ICD-10-CM | POA: Diagnosis not present

## 2019-01-19 DIAGNOSIS — M25462 Effusion, left knee: Secondary | ICD-10-CM | POA: Diagnosis not present

## 2019-01-19 DIAGNOSIS — M47812 Spondylosis without myelopathy or radiculopathy, cervical region: Secondary | ICD-10-CM | POA: Diagnosis not present

## 2019-01-19 DIAGNOSIS — E861 Hypovolemia: Secondary | ICD-10-CM | POA: Diagnosis not present

## 2019-01-19 DIAGNOSIS — N183 Chronic kidney disease, stage 3 unspecified: Secondary | ICD-10-CM | POA: Diagnosis not present

## 2019-01-19 DIAGNOSIS — I130 Hypertensive heart and chronic kidney disease with heart failure and stage 1 through stage 4 chronic kidney disease, or unspecified chronic kidney disease: Secondary | ICD-10-CM | POA: Diagnosis not present

## 2019-01-19 DIAGNOSIS — S79912A Unspecified injury of left hip, initial encounter: Secondary | ICD-10-CM | POA: Diagnosis not present

## 2019-01-19 DIAGNOSIS — R296 Repeated falls: Secondary | ICD-10-CM

## 2019-01-19 DIAGNOSIS — J449 Chronic obstructive pulmonary disease, unspecified: Secondary | ICD-10-CM | POA: Diagnosis not present

## 2019-01-19 DIAGNOSIS — I77810 Thoracic aortic ectasia: Secondary | ICD-10-CM | POA: Diagnosis not present

## 2019-01-19 DIAGNOSIS — E1122 Type 2 diabetes mellitus with diabetic chronic kidney disease: Secondary | ICD-10-CM | POA: Diagnosis not present

## 2019-01-19 DIAGNOSIS — M25562 Pain in left knee: Secondary | ICD-10-CM | POA: Diagnosis not present

## 2019-01-19 DIAGNOSIS — J9811 Atelectasis: Secondary | ICD-10-CM | POA: Diagnosis not present

## 2019-01-19 DIAGNOSIS — Z981 Arthrodesis status: Secondary | ICD-10-CM | POA: Diagnosis not present

## 2019-01-19 DIAGNOSIS — R531 Weakness: Secondary | ICD-10-CM | POA: Diagnosis not present

## 2019-01-19 DIAGNOSIS — S72123A Displaced fracture of lesser trochanter of unspecified femur, initial encounter for closed fracture: Secondary | ICD-10-CM | POA: Diagnosis not present

## 2019-01-19 DIAGNOSIS — E1142 Type 2 diabetes mellitus with diabetic polyneuropathy: Secondary | ICD-10-CM | POA: Diagnosis not present

## 2019-01-19 DIAGNOSIS — Z96652 Presence of left artificial knee joint: Secondary | ICD-10-CM | POA: Diagnosis not present

## 2019-01-19 DIAGNOSIS — R0602 Shortness of breath: Secondary | ICD-10-CM | POA: Diagnosis not present

## 2019-01-19 DIAGNOSIS — R001 Bradycardia, unspecified: Secondary | ICD-10-CM | POA: Diagnosis not present

## 2019-01-19 DIAGNOSIS — I5032 Chronic diastolic (congestive) heart failure: Secondary | ICD-10-CM | POA: Diagnosis not present

## 2019-01-19 DIAGNOSIS — M170 Bilateral primary osteoarthritis of knee: Secondary | ICD-10-CM | POA: Diagnosis not present

## 2019-01-19 DIAGNOSIS — E785 Hyperlipidemia, unspecified: Secondary | ICD-10-CM | POA: Diagnosis not present

## 2019-01-19 DIAGNOSIS — I252 Old myocardial infarction: Secondary | ICD-10-CM | POA: Diagnosis not present

## 2019-01-19 DIAGNOSIS — I2510 Atherosclerotic heart disease of native coronary artery without angina pectoris: Secondary | ICD-10-CM | POA: Diagnosis not present

## 2019-01-19 HISTORY — DX: Repeated falls: R29.6

## 2019-01-20 DIAGNOSIS — E1142 Type 2 diabetes mellitus with diabetic polyneuropathy: Secondary | ICD-10-CM | POA: Diagnosis not present

## 2019-01-20 DIAGNOSIS — Z794 Long term (current) use of insulin: Secondary | ICD-10-CM | POA: Diagnosis not present

## 2019-01-20 DIAGNOSIS — E1122 Type 2 diabetes mellitus with diabetic chronic kidney disease: Secondary | ICD-10-CM | POA: Diagnosis not present

## 2019-01-20 DIAGNOSIS — S72123A Displaced fracture of lesser trochanter of unspecified femur, initial encounter for closed fracture: Secondary | ICD-10-CM | POA: Diagnosis not present

## 2019-01-20 DIAGNOSIS — R0902 Hypoxemia: Secondary | ICD-10-CM | POA: Diagnosis not present

## 2019-01-20 DIAGNOSIS — I252 Old myocardial infarction: Secondary | ICD-10-CM | POA: Diagnosis not present

## 2019-01-20 DIAGNOSIS — R296 Repeated falls: Secondary | ICD-10-CM | POA: Diagnosis not present

## 2019-01-20 DIAGNOSIS — N183 Chronic kidney disease, stage 3 unspecified: Secondary | ICD-10-CM | POA: Diagnosis not present

## 2019-01-20 DIAGNOSIS — I77810 Thoracic aortic ectasia: Secondary | ICD-10-CM | POA: Diagnosis not present

## 2019-01-20 DIAGNOSIS — E785 Hyperlipidemia, unspecified: Secondary | ICD-10-CM | POA: Diagnosis not present

## 2019-01-20 DIAGNOSIS — R55 Syncope and collapse: Secondary | ICD-10-CM | POA: Diagnosis not present

## 2019-01-20 DIAGNOSIS — M25462 Effusion, left knee: Secondary | ICD-10-CM | POA: Diagnosis not present

## 2019-01-20 DIAGNOSIS — I5032 Chronic diastolic (congestive) heart failure: Secondary | ICD-10-CM | POA: Diagnosis not present

## 2019-01-20 DIAGNOSIS — I2510 Atherosclerotic heart disease of native coronary artery without angina pectoris: Secondary | ICD-10-CM | POA: Diagnosis not present

## 2019-01-20 DIAGNOSIS — J449 Chronic obstructive pulmonary disease, unspecified: Secondary | ICD-10-CM | POA: Diagnosis not present

## 2019-01-20 DIAGNOSIS — Z86711 Personal history of pulmonary embolism: Secondary | ICD-10-CM | POA: Diagnosis not present

## 2019-01-20 DIAGNOSIS — M170 Bilateral primary osteoarthritis of knee: Secondary | ICD-10-CM | POA: Diagnosis not present

## 2019-01-20 DIAGNOSIS — E861 Hypovolemia: Secondary | ICD-10-CM | POA: Diagnosis not present

## 2019-01-20 DIAGNOSIS — I130 Hypertensive heart and chronic kidney disease with heart failure and stage 1 through stage 4 chronic kidney disease, or unspecified chronic kidney disease: Secondary | ICD-10-CM | POA: Diagnosis not present

## 2019-01-20 DIAGNOSIS — M7989 Other specified soft tissue disorders: Secondary | ICD-10-CM | POA: Diagnosis not present

## 2019-01-21 DIAGNOSIS — I130 Hypertensive heart and chronic kidney disease with heart failure and stage 1 through stage 4 chronic kidney disease, or unspecified chronic kidney disease: Secondary | ICD-10-CM | POA: Diagnosis not present

## 2019-01-21 DIAGNOSIS — E861 Hypovolemia: Secondary | ICD-10-CM | POA: Diagnosis not present

## 2019-01-21 DIAGNOSIS — J449 Chronic obstructive pulmonary disease, unspecified: Secondary | ICD-10-CM | POA: Diagnosis not present

## 2019-01-21 DIAGNOSIS — R296 Repeated falls: Secondary | ICD-10-CM | POA: Diagnosis not present

## 2019-01-21 DIAGNOSIS — M170 Bilateral primary osteoarthritis of knee: Secondary | ICD-10-CM | POA: Diagnosis not present

## 2019-01-21 DIAGNOSIS — R0602 Shortness of breath: Secondary | ICD-10-CM | POA: Diagnosis not present

## 2019-01-21 DIAGNOSIS — E785 Hyperlipidemia, unspecified: Secondary | ICD-10-CM | POA: Diagnosis not present

## 2019-01-21 DIAGNOSIS — R918 Other nonspecific abnormal finding of lung field: Secondary | ICD-10-CM | POA: Diagnosis not present

## 2019-01-21 DIAGNOSIS — E1142 Type 2 diabetes mellitus with diabetic polyneuropathy: Secondary | ICD-10-CM | POA: Diagnosis not present

## 2019-01-21 DIAGNOSIS — I2510 Atherosclerotic heart disease of native coronary artery without angina pectoris: Secondary | ICD-10-CM | POA: Diagnosis not present

## 2019-01-21 DIAGNOSIS — Z794 Long term (current) use of insulin: Secondary | ICD-10-CM | POA: Diagnosis not present

## 2019-01-21 DIAGNOSIS — S72123A Displaced fracture of lesser trochanter of unspecified femur, initial encounter for closed fracture: Secondary | ICD-10-CM | POA: Diagnosis not present

## 2019-01-21 DIAGNOSIS — Q211 Atrial septal defect: Secondary | ICD-10-CM | POA: Diagnosis not present

## 2019-01-21 DIAGNOSIS — E1122 Type 2 diabetes mellitus with diabetic chronic kidney disease: Secondary | ICD-10-CM | POA: Diagnosis not present

## 2019-01-21 DIAGNOSIS — M25462 Effusion, left knee: Secondary | ICD-10-CM | POA: Diagnosis not present

## 2019-01-21 DIAGNOSIS — N183 Chronic kidney disease, stage 3 unspecified: Secondary | ICD-10-CM | POA: Diagnosis not present

## 2019-01-21 DIAGNOSIS — R0600 Dyspnea, unspecified: Secondary | ICD-10-CM | POA: Diagnosis not present

## 2019-01-21 DIAGNOSIS — I5030 Unspecified diastolic (congestive) heart failure: Secondary | ICD-10-CM | POA: Diagnosis not present

## 2019-01-21 DIAGNOSIS — R0902 Hypoxemia: Secondary | ICD-10-CM | POA: Diagnosis not present

## 2019-01-21 DIAGNOSIS — I252 Old myocardial infarction: Secondary | ICD-10-CM | POA: Diagnosis not present

## 2019-01-21 DIAGNOSIS — I5032 Chronic diastolic (congestive) heart failure: Secondary | ICD-10-CM | POA: Diagnosis not present

## 2019-01-21 DIAGNOSIS — I499 Cardiac arrhythmia, unspecified: Secondary | ICD-10-CM | POA: Diagnosis not present

## 2019-01-21 DIAGNOSIS — I77810 Thoracic aortic ectasia: Secondary | ICD-10-CM | POA: Diagnosis not present

## 2019-01-21 DIAGNOSIS — R55 Syncope and collapse: Secondary | ICD-10-CM | POA: Diagnosis not present

## 2019-01-21 MED ORDER — ANORO ELLIPTA 62.5 MCG-25 MCG/ACTUATION POWDER FOR INHALATION
Freq: Every day | RESPIRATORY_TRACT | 0 refills | 30 days | Status: CP
Start: 2019-01-21 — End: 2019-01-21

## 2019-01-21 MED ORDER — FLUTICASONE 250 MCG-SALMETEROL 50 MCG/DOSE BLISTR POWDR FOR INHALATION: 1 | each | Freq: Two times a day (BID) | 11 refills | 30 days | Status: AC

## 2019-01-21 MED ORDER — INSULIN GLARGINE (U-100) 100 UNIT/ML (3 ML) SUBCUTANEOUS PEN
Freq: Every day | SUBCUTANEOUS | 0 refills | 50 days | Status: CP
Start: 2019-01-21 — End: 2019-01-21

## 2019-01-21 MED ORDER — FLUTICASONE 250 MCG-SALMETEROL 50 MCG/DOSE BLISTR POWDR FOR INHALATION
Freq: Two times a day (BID) | RESPIRATORY_TRACT | 11 refills | 30.00000 days | Status: CP
Start: 2019-01-21 — End: 2019-01-21

## 2019-01-21 MED ORDER — FLUTICASONE 250 MCG-SALMETEROL 50 MCG/DOSE BLISTR POWDR FOR INHALATION: 1 | each | Freq: Two times a day (BID) | 1 refills | 30 days | Status: AC

## 2019-01-21 MED ORDER — LANTUS SOLOSTAR U-100 INSULIN 100 UNIT/ML (3 ML) SUBCUTANEOUS PEN
Freq: Every day | SUBCUTANEOUS | 0 refills | 50 days | Status: CP
Start: 2019-01-21 — End: 2019-01-21

## 2019-01-22 DIAGNOSIS — I130 Hypertensive heart and chronic kidney disease with heart failure and stage 1 through stage 4 chronic kidney disease, or unspecified chronic kidney disease: Secondary | ICD-10-CM | POA: Diagnosis not present

## 2019-01-22 DIAGNOSIS — I5032 Chronic diastolic (congestive) heart failure: Secondary | ICD-10-CM | POA: Diagnosis not present

## 2019-01-22 DIAGNOSIS — E1122 Type 2 diabetes mellitus with diabetic chronic kidney disease: Secondary | ICD-10-CM | POA: Diagnosis not present

## 2019-01-22 DIAGNOSIS — E1142 Type 2 diabetes mellitus with diabetic polyneuropathy: Secondary | ICD-10-CM | POA: Diagnosis not present

## 2019-01-22 DIAGNOSIS — Z794 Long term (current) use of insulin: Secondary | ICD-10-CM | POA: Diagnosis not present

## 2019-01-22 DIAGNOSIS — R55 Syncope and collapse: Secondary | ICD-10-CM | POA: Diagnosis not present

## 2019-01-22 DIAGNOSIS — I2510 Atherosclerotic heart disease of native coronary artery without angina pectoris: Secondary | ICD-10-CM | POA: Diagnosis not present

## 2019-01-22 DIAGNOSIS — E785 Hyperlipidemia, unspecified: Secondary | ICD-10-CM | POA: Diagnosis not present

## 2019-01-22 DIAGNOSIS — R296 Repeated falls: Secondary | ICD-10-CM | POA: Diagnosis not present

## 2019-01-22 DIAGNOSIS — M170 Bilateral primary osteoarthritis of knee: Secondary | ICD-10-CM | POA: Diagnosis not present

## 2019-01-22 DIAGNOSIS — S72123A Displaced fracture of lesser trochanter of unspecified femur, initial encounter for closed fracture: Secondary | ICD-10-CM | POA: Diagnosis not present

## 2019-01-22 DIAGNOSIS — R0902 Hypoxemia: Secondary | ICD-10-CM | POA: Diagnosis not present

## 2019-01-22 DIAGNOSIS — J449 Chronic obstructive pulmonary disease, unspecified: Secondary | ICD-10-CM | POA: Diagnosis not present

## 2019-01-22 DIAGNOSIS — E861 Hypovolemia: Secondary | ICD-10-CM | POA: Diagnosis not present

## 2019-01-22 DIAGNOSIS — I77810 Thoracic aortic ectasia: Secondary | ICD-10-CM | POA: Diagnosis not present

## 2019-01-22 DIAGNOSIS — N183 Chronic kidney disease, stage 3 unspecified: Secondary | ICD-10-CM | POA: Diagnosis not present

## 2019-01-22 DIAGNOSIS — I252 Old myocardial infarction: Secondary | ICD-10-CM | POA: Diagnosis not present

## 2019-01-22 DIAGNOSIS — M25462 Effusion, left knee: Secondary | ICD-10-CM | POA: Diagnosis not present

## 2019-01-22 MED ORDER — METOPROLOL TARTRATE 25 MG TABLET
ORAL_TABLET | Freq: Two times a day (BID) | ORAL | 0 refills | 30 days | Status: CP
Start: 2019-01-22 — End: 2019-02-21

## 2019-01-22 MED ORDER — ALBUTEROL SULFATE HFA 90 MCG/ACTUATION AEROSOL INHALER
Freq: Four times a day (QID) | RESPIRATORY_TRACT | 11 refills | 0 days | Status: CP | PRN
Start: 2019-01-22 — End: ?

## 2019-01-22 MED ORDER — UMECLIDINIUM 62.5 MCG/ACTUATION BLISTER POWDER FOR INHALATION
Freq: Every day | RESPIRATORY_TRACT | 1 refills | 30 days | Status: CP
Start: 2019-01-22 — End: 2019-03-23

## 2019-01-22 MED ORDER — TORSEMIDE 20 MG TABLET
Freq: Every day | ORAL | 2 refills | 24 days | Status: CP
Start: 2019-01-22 — End: 2019-02-21

## 2019-01-22 MED ORDER — SERTRALINE 100 MG TABLET
ORAL_TABLET | Freq: Every day | ORAL | 0 refills | 30.00000 days | Status: CP
Start: 2019-01-22 — End: 2019-02-21

## 2019-01-22 MED ORDER — GABAPENTIN 300 MG CAPSULE
ORAL_CAPSULE | Freq: Four times a day (QID) | ORAL | 0 refills | 30.00000 days
Start: 2019-01-22 — End: 2019-02-21

## 2019-01-22 MED ORDER — OXYCODONE-ACETAMINOPHEN 10 MG-325 MG TABLET
ORAL_TABLET | Freq: Three times a day (TID) | ORAL | 0 refills | 5.00000 days | Status: CP
Start: 2019-01-22 — End: 2019-01-27

## 2019-01-22 MED ORDER — ACETAMINOPHEN 500 MG TABLET
ORAL_TABLET | Freq: Three times a day (TID) | ORAL | 0 refills | 5.00000 days
Start: 2019-01-22 — End: ?

## 2019-01-22 MED ORDER — FLUTICASONE 232 MCG-SALMETEROL 14 MCG/ACTUATION BREATH ACTIVATED POWDR
Freq: Two times a day (BID) | RESPIRATORY_TRACT | 2 refills | 0.00000 days | Status: CP
Start: 2019-01-22 — End: ?

## 2019-01-22 MED ORDER — DICLOFENAC 1 % TOPICAL GEL
Freq: Four times a day (QID) | TOPICAL | 0 refills | 13 days | Status: CP
Start: 2019-01-22 — End: 2020-01-22

## 2019-01-22 MED ORDER — SENNOSIDES 8.6 MG TABLET
ORAL_TABLET | Freq: Every evening | ORAL | 0 refills | 30 days
Start: 2019-01-22 — End: 2019-02-21

## 2019-01-22 MED ORDER — INSULIN NPH ISOPHANE U-100 HUMAN 100 UNIT/ML SUBCUTANEOUS SUSPENSION
Freq: Every evening | SUBCUTANEOUS | 0 refills | 30 days | Status: CP
Start: 2019-01-22 — End: 2019-02-21

## 2019-01-22 MED ORDER — NICOTINE (POLACRILEX) 4 MG GUM
BUCCAL | 0 refills | 5.00000 days | Status: CP | PRN
Start: 2019-01-22 — End: 2019-02-21

## 2019-01-23 MED ORDER — NICOTINE 21 MG/24 HR DAILY TRANSDERMAL PATCH
MEDICATED_PATCH | Freq: Every day | TRANSDERMAL | 0 refills | 14 days | Status: CP
Start: 2019-01-23 — End: ?

## 2019-01-23 MED ORDER — ISOSORBIDE MONONITRATE ER 30 MG TABLET,EXTENDED RELEASE 24 HR
ORAL_TABLET | Freq: Every day | ORAL | 11 refills | 30 days | Status: CP
Start: 2019-01-23 — End: 2020-01-23

## 2019-01-23 MED ORDER — POLYETHYLENE GLYCOL 3350 17 GRAM ORAL POWDER PACKET
PACK | Freq: Every day | ORAL | 0 refills | 30 days
Start: 2019-01-23 — End: 2019-02-22

## 2019-01-25 ENCOUNTER — Encounter: Admit: 2019-01-25 | Discharge: 2019-02-22 | Payer: MEDICARE

## 2019-01-25 DIAGNOSIS — E1122 Type 2 diabetes mellitus with diabetic chronic kidney disease: Secondary | ICD-10-CM | POA: Diagnosis not present

## 2019-01-25 DIAGNOSIS — Z794 Long term (current) use of insulin: Secondary | ICD-10-CM | POA: Diagnosis not present

## 2019-01-25 DIAGNOSIS — Z7902 Long term (current) use of antithrombotics/antiplatelets: Secondary | ICD-10-CM | POA: Diagnosis not present

## 2019-01-25 DIAGNOSIS — Z9181 History of falling: Secondary | ICD-10-CM | POA: Diagnosis not present

## 2019-01-25 DIAGNOSIS — N183 Chronic kidney disease, stage 3 unspecified: Secondary | ICD-10-CM | POA: Diagnosis not present

## 2019-01-25 DIAGNOSIS — M170 Bilateral primary osteoarthritis of knee: Secondary | ICD-10-CM | POA: Diagnosis not present

## 2019-01-25 DIAGNOSIS — I25118 Atherosclerotic heart disease of native coronary artery with other forms of angina pectoris: Secondary | ICD-10-CM | POA: Diagnosis not present

## 2019-01-25 DIAGNOSIS — I5032 Chronic diastolic (congestive) heart failure: Secondary | ICD-10-CM | POA: Diagnosis not present

## 2019-01-25 DIAGNOSIS — R296 Repeated falls: Secondary | ICD-10-CM | POA: Diagnosis not present

## 2019-01-25 DIAGNOSIS — G4733 Obstructive sleep apnea (adult) (pediatric): Secondary | ICD-10-CM | POA: Diagnosis not present

## 2019-01-25 DIAGNOSIS — I130 Hypertensive heart and chronic kidney disease with heart failure and stage 1 through stage 4 chronic kidney disease, or unspecified chronic kidney disease: Secondary | ICD-10-CM | POA: Diagnosis not present

## 2019-01-25 DIAGNOSIS — Z955 Presence of coronary angioplasty implant and graft: Secondary | ICD-10-CM | POA: Diagnosis not present

## 2019-01-25 DIAGNOSIS — Z66 Do not resuscitate: Secondary | ICD-10-CM | POA: Diagnosis not present

## 2019-01-25 DIAGNOSIS — Z86711 Personal history of pulmonary embolism: Secondary | ICD-10-CM | POA: Diagnosis not present

## 2019-01-25 DIAGNOSIS — N179 Acute kidney failure, unspecified: Secondary | ICD-10-CM | POA: Diagnosis not present

## 2019-01-27 DIAGNOSIS — Z96652 Presence of left artificial knee joint: Secondary | ICD-10-CM | POA: Diagnosis not present

## 2019-01-27 DIAGNOSIS — M25562 Pain in left knee: Secondary | ICD-10-CM | POA: Diagnosis not present

## 2019-01-27 DIAGNOSIS — M25561 Pain in right knee: Secondary | ICD-10-CM | POA: Diagnosis not present

## 2019-01-27 DIAGNOSIS — G8929 Other chronic pain: Secondary | ICD-10-CM | POA: Diagnosis not present

## 2019-01-28 DIAGNOSIS — E1169 Type 2 diabetes mellitus with other specified complication: Secondary | ICD-10-CM | POA: Diagnosis not present

## 2019-01-28 DIAGNOSIS — Z5181 Encounter for therapeutic drug level monitoring: Secondary | ICD-10-CM | POA: Diagnosis not present

## 2019-01-28 DIAGNOSIS — M545 Low back pain: Secondary | ICD-10-CM | POA: Diagnosis not present

## 2019-01-28 DIAGNOSIS — G8929 Other chronic pain: Secondary | ICD-10-CM | POA: Diagnosis not present

## 2019-01-28 DIAGNOSIS — I25118 Atherosclerotic heart disease of native coronary artery with other forms of angina pectoris: Secondary | ICD-10-CM | POA: Diagnosis not present

## 2019-01-28 DIAGNOSIS — M25561 Pain in right knee: Secondary | ICD-10-CM | POA: Diagnosis not present

## 2019-01-28 MED FILL — REPATHA SURECLICK 140 MG/ML SUBCUTANEOUS PEN INJECTOR: 28 days supply | Qty: 2 | Fill #0 | Status: AC

## 2019-02-01 DIAGNOSIS — I130 Hypertensive heart and chronic kidney disease with heart failure and stage 1 through stage 4 chronic kidney disease, or unspecified chronic kidney disease: Secondary | ICD-10-CM | POA: Diagnosis not present

## 2019-02-01 DIAGNOSIS — Z9181 History of falling: Secondary | ICD-10-CM | POA: Diagnosis not present

## 2019-02-01 DIAGNOSIS — N179 Acute kidney failure, unspecified: Secondary | ICD-10-CM | POA: Diagnosis not present

## 2019-02-01 DIAGNOSIS — I25118 Atherosclerotic heart disease of native coronary artery with other forms of angina pectoris: Secondary | ICD-10-CM | POA: Diagnosis not present

## 2019-02-01 DIAGNOSIS — Z794 Long term (current) use of insulin: Secondary | ICD-10-CM | POA: Diagnosis not present

## 2019-02-01 DIAGNOSIS — I5032 Chronic diastolic (congestive) heart failure: Secondary | ICD-10-CM | POA: Diagnosis not present

## 2019-02-01 DIAGNOSIS — Z66 Do not resuscitate: Secondary | ICD-10-CM | POA: Diagnosis not present

## 2019-02-01 DIAGNOSIS — R296 Repeated falls: Secondary | ICD-10-CM | POA: Diagnosis not present

## 2019-02-01 DIAGNOSIS — G4733 Obstructive sleep apnea (adult) (pediatric): Secondary | ICD-10-CM | POA: Diagnosis not present

## 2019-02-01 DIAGNOSIS — Z7902 Long term (current) use of antithrombotics/antiplatelets: Secondary | ICD-10-CM | POA: Diagnosis not present

## 2019-02-01 DIAGNOSIS — E1122 Type 2 diabetes mellitus with diabetic chronic kidney disease: Secondary | ICD-10-CM | POA: Diagnosis not present

## 2019-02-01 DIAGNOSIS — Z86711 Personal history of pulmonary embolism: Secondary | ICD-10-CM | POA: Diagnosis not present

## 2019-02-01 DIAGNOSIS — M170 Bilateral primary osteoarthritis of knee: Secondary | ICD-10-CM | POA: Diagnosis not present

## 2019-02-01 DIAGNOSIS — Z955 Presence of coronary angioplasty implant and graft: Secondary | ICD-10-CM | POA: Diagnosis not present

## 2019-02-01 DIAGNOSIS — N183 Chronic kidney disease, stage 3 unspecified: Secondary | ICD-10-CM | POA: Diagnosis not present

## 2019-02-04 DIAGNOSIS — Z86711 Personal history of pulmonary embolism: Secondary | ICD-10-CM | POA: Diagnosis not present

## 2019-02-04 DIAGNOSIS — G4733 Obstructive sleep apnea (adult) (pediatric): Secondary | ICD-10-CM | POA: Diagnosis not present

## 2019-02-04 DIAGNOSIS — Z66 Do not resuscitate: Secondary | ICD-10-CM | POA: Diagnosis not present

## 2019-02-04 DIAGNOSIS — N179 Acute kidney failure, unspecified: Secondary | ICD-10-CM | POA: Diagnosis not present

## 2019-02-04 DIAGNOSIS — N183 Chronic kidney disease, stage 3 unspecified: Secondary | ICD-10-CM | POA: Diagnosis not present

## 2019-02-04 DIAGNOSIS — Z955 Presence of coronary angioplasty implant and graft: Secondary | ICD-10-CM | POA: Diagnosis not present

## 2019-02-04 DIAGNOSIS — I25118 Atherosclerotic heart disease of native coronary artery with other forms of angina pectoris: Secondary | ICD-10-CM | POA: Diagnosis not present

## 2019-02-04 DIAGNOSIS — Z9181 History of falling: Secondary | ICD-10-CM | POA: Diagnosis not present

## 2019-02-04 DIAGNOSIS — E1122 Type 2 diabetes mellitus with diabetic chronic kidney disease: Secondary | ICD-10-CM | POA: Diagnosis not present

## 2019-02-04 DIAGNOSIS — I130 Hypertensive heart and chronic kidney disease with heart failure and stage 1 through stage 4 chronic kidney disease, or unspecified chronic kidney disease: Secondary | ICD-10-CM | POA: Diagnosis not present

## 2019-02-04 DIAGNOSIS — M170 Bilateral primary osteoarthritis of knee: Secondary | ICD-10-CM | POA: Diagnosis not present

## 2019-02-04 DIAGNOSIS — Z7902 Long term (current) use of antithrombotics/antiplatelets: Secondary | ICD-10-CM | POA: Diagnosis not present

## 2019-02-04 DIAGNOSIS — I5032 Chronic diastolic (congestive) heart failure: Secondary | ICD-10-CM | POA: Diagnosis not present

## 2019-02-04 DIAGNOSIS — Z794 Long term (current) use of insulin: Secondary | ICD-10-CM | POA: Diagnosis not present

## 2019-02-04 DIAGNOSIS — R296 Repeated falls: Secondary | ICD-10-CM | POA: Diagnosis not present

## 2019-02-09 DIAGNOSIS — Z7902 Long term (current) use of antithrombotics/antiplatelets: Secondary | ICD-10-CM | POA: Diagnosis not present

## 2019-02-09 DIAGNOSIS — N183 Chronic kidney disease, stage 3 unspecified: Secondary | ICD-10-CM | POA: Diagnosis not present

## 2019-02-09 DIAGNOSIS — R296 Repeated falls: Secondary | ICD-10-CM | POA: Diagnosis not present

## 2019-02-09 DIAGNOSIS — M170 Bilateral primary osteoarthritis of knee: Secondary | ICD-10-CM | POA: Diagnosis not present

## 2019-02-09 DIAGNOSIS — Z66 Do not resuscitate: Secondary | ICD-10-CM | POA: Diagnosis not present

## 2019-02-09 DIAGNOSIS — I5032 Chronic diastolic (congestive) heart failure: Secondary | ICD-10-CM | POA: Diagnosis not present

## 2019-02-09 DIAGNOSIS — Z86711 Personal history of pulmonary embolism: Secondary | ICD-10-CM | POA: Diagnosis not present

## 2019-02-09 DIAGNOSIS — G4733 Obstructive sleep apnea (adult) (pediatric): Secondary | ICD-10-CM | POA: Diagnosis not present

## 2019-02-09 DIAGNOSIS — Z955 Presence of coronary angioplasty implant and graft: Secondary | ICD-10-CM | POA: Diagnosis not present

## 2019-02-09 DIAGNOSIS — N179 Acute kidney failure, unspecified: Secondary | ICD-10-CM | POA: Diagnosis not present

## 2019-02-09 DIAGNOSIS — I130 Hypertensive heart and chronic kidney disease with heart failure and stage 1 through stage 4 chronic kidney disease, or unspecified chronic kidney disease: Secondary | ICD-10-CM | POA: Diagnosis not present

## 2019-02-09 DIAGNOSIS — I25118 Atherosclerotic heart disease of native coronary artery with other forms of angina pectoris: Secondary | ICD-10-CM | POA: Diagnosis not present

## 2019-02-09 DIAGNOSIS — E1122 Type 2 diabetes mellitus with diabetic chronic kidney disease: Secondary | ICD-10-CM | POA: Diagnosis not present

## 2019-02-09 DIAGNOSIS — Z794 Long term (current) use of insulin: Secondary | ICD-10-CM | POA: Diagnosis not present

## 2019-02-09 DIAGNOSIS — Z9181 History of falling: Secondary | ICD-10-CM | POA: Diagnosis not present

## 2019-02-16 DIAGNOSIS — Z7902 Long term (current) use of antithrombotics/antiplatelets: Secondary | ICD-10-CM | POA: Diagnosis not present

## 2019-02-16 DIAGNOSIS — E1122 Type 2 diabetes mellitus with diabetic chronic kidney disease: Secondary | ICD-10-CM | POA: Diagnosis not present

## 2019-02-16 DIAGNOSIS — Z66 Do not resuscitate: Secondary | ICD-10-CM | POA: Diagnosis not present

## 2019-02-16 DIAGNOSIS — R296 Repeated falls: Secondary | ICD-10-CM | POA: Diagnosis not present

## 2019-02-16 DIAGNOSIS — Z86711 Personal history of pulmonary embolism: Secondary | ICD-10-CM | POA: Diagnosis not present

## 2019-02-16 DIAGNOSIS — I25118 Atherosclerotic heart disease of native coronary artery with other forms of angina pectoris: Secondary | ICD-10-CM | POA: Diagnosis not present

## 2019-02-16 DIAGNOSIS — Z955 Presence of coronary angioplasty implant and graft: Secondary | ICD-10-CM | POA: Diagnosis not present

## 2019-02-16 DIAGNOSIS — N179 Acute kidney failure, unspecified: Secondary | ICD-10-CM | POA: Diagnosis not present

## 2019-02-16 DIAGNOSIS — N183 Chronic kidney disease, stage 3 unspecified: Secondary | ICD-10-CM | POA: Diagnosis not present

## 2019-02-16 DIAGNOSIS — Z9181 History of falling: Secondary | ICD-10-CM | POA: Diagnosis not present

## 2019-02-16 DIAGNOSIS — M170 Bilateral primary osteoarthritis of knee: Secondary | ICD-10-CM | POA: Diagnosis not present

## 2019-02-16 DIAGNOSIS — I130 Hypertensive heart and chronic kidney disease with heart failure and stage 1 through stage 4 chronic kidney disease, or unspecified chronic kidney disease: Secondary | ICD-10-CM | POA: Diagnosis not present

## 2019-02-16 DIAGNOSIS — I5032 Chronic diastolic (congestive) heart failure: Secondary | ICD-10-CM | POA: Diagnosis not present

## 2019-02-16 DIAGNOSIS — Z794 Long term (current) use of insulin: Secondary | ICD-10-CM | POA: Diagnosis not present

## 2019-02-16 DIAGNOSIS — G4733 Obstructive sleep apnea (adult) (pediatric): Secondary | ICD-10-CM | POA: Diagnosis not present

## 2019-02-22 DIAGNOSIS — I25118 Atherosclerotic heart disease of native coronary artery with other forms of angina pectoris: Secondary | ICD-10-CM | POA: Diagnosis not present

## 2019-02-22 DIAGNOSIS — I5032 Chronic diastolic (congestive) heart failure: Secondary | ICD-10-CM | POA: Diagnosis not present

## 2019-02-22 DIAGNOSIS — Z86711 Personal history of pulmonary embolism: Secondary | ICD-10-CM | POA: Diagnosis not present

## 2019-02-22 DIAGNOSIS — Z7902 Long term (current) use of antithrombotics/antiplatelets: Secondary | ICD-10-CM | POA: Diagnosis not present

## 2019-02-22 DIAGNOSIS — Z955 Presence of coronary angioplasty implant and graft: Secondary | ICD-10-CM | POA: Diagnosis not present

## 2019-02-22 DIAGNOSIS — Z794 Long term (current) use of insulin: Secondary | ICD-10-CM | POA: Diagnosis not present

## 2019-02-22 DIAGNOSIS — E1122 Type 2 diabetes mellitus with diabetic chronic kidney disease: Secondary | ICD-10-CM | POA: Diagnosis not present

## 2019-02-22 DIAGNOSIS — G4733 Obstructive sleep apnea (adult) (pediatric): Secondary | ICD-10-CM | POA: Diagnosis not present

## 2019-02-22 DIAGNOSIS — N183 Chronic kidney disease, stage 3 unspecified: Secondary | ICD-10-CM | POA: Diagnosis not present

## 2019-02-22 DIAGNOSIS — R296 Repeated falls: Secondary | ICD-10-CM | POA: Diagnosis not present

## 2019-02-22 DIAGNOSIS — Z9181 History of falling: Secondary | ICD-10-CM | POA: Diagnosis not present

## 2019-02-22 DIAGNOSIS — M170 Bilateral primary osteoarthritis of knee: Secondary | ICD-10-CM | POA: Diagnosis not present

## 2019-02-22 DIAGNOSIS — N179 Acute kidney failure, unspecified: Secondary | ICD-10-CM | POA: Diagnosis not present

## 2019-02-22 DIAGNOSIS — I130 Hypertensive heart and chronic kidney disease with heart failure and stage 1 through stage 4 chronic kidney disease, or unspecified chronic kidney disease: Secondary | ICD-10-CM | POA: Diagnosis not present

## 2019-02-22 DIAGNOSIS — Z66 Do not resuscitate: Secondary | ICD-10-CM | POA: Diagnosis not present

## 2019-02-24 MED FILL — REPATHA SURECLICK 140 MG/ML SUBCUTANEOUS PEN INJECTOR: 28 days supply | Qty: 2 | Fill #1 | Status: AC

## 2019-02-24 MED FILL — REPATHA SURECLICK 140 MG/ML SUBCUTANEOUS PEN INJECTOR: SUBCUTANEOUS | 28 days supply | Qty: 2 | Fill #1

## 2019-02-26 DIAGNOSIS — J9612 Chronic respiratory failure with hypercapnia: Secondary | ICD-10-CM | POA: Diagnosis not present

## 2019-02-28 DIAGNOSIS — M25561 Pain in right knee: Secondary | ICD-10-CM | POA: Diagnosis not present

## 2019-02-28 DIAGNOSIS — M545 Low back pain: Secondary | ICD-10-CM | POA: Diagnosis not present

## 2019-02-28 DIAGNOSIS — J9612 Chronic respiratory failure with hypercapnia: Secondary | ICD-10-CM | POA: Diagnosis not present

## 2019-02-28 DIAGNOSIS — Z5181 Encounter for therapeutic drug level monitoring: Secondary | ICD-10-CM | POA: Diagnosis not present

## 2019-02-28 DIAGNOSIS — G8929 Other chronic pain: Secondary | ICD-10-CM | POA: Diagnosis not present

## 2019-02-28 DIAGNOSIS — E782 Mixed hyperlipidemia: Secondary | ICD-10-CM | POA: Diagnosis not present

## 2019-03-15 ENCOUNTER — Encounter: Admit: 2019-03-15 | Discharge: 2019-03-22 | Payer: MEDICARE

## 2019-03-15 DIAGNOSIS — E119 Type 2 diabetes mellitus without complications: Secondary | ICD-10-CM | POA: Diagnosis not present

## 2019-03-15 DIAGNOSIS — G4733 Obstructive sleep apnea (adult) (pediatric): Secondary | ICD-10-CM | POA: Diagnosis not present

## 2019-03-15 DIAGNOSIS — S0990XA Unspecified injury of head, initial encounter: Secondary | ICD-10-CM | POA: Diagnosis not present

## 2019-03-15 DIAGNOSIS — Z794 Long term (current) use of insulin: Secondary | ICD-10-CM | POA: Diagnosis not present

## 2019-03-15 DIAGNOSIS — R918 Other nonspecific abnormal finding of lung field: Secondary | ICD-10-CM | POA: Diagnosis not present

## 2019-03-15 DIAGNOSIS — Z955 Presence of coronary angioplasty implant and graft: Secondary | ICD-10-CM | POA: Diagnosis not present

## 2019-03-15 DIAGNOSIS — R296 Repeated falls: Secondary | ICD-10-CM | POA: Diagnosis not present

## 2019-03-15 DIAGNOSIS — Z20822 Contact with and (suspected) exposure to covid-19: Secondary | ICD-10-CM | POA: Diagnosis not present

## 2019-03-15 DIAGNOSIS — K219 Gastro-esophageal reflux disease without esophagitis: Secondary | ICD-10-CM | POA: Diagnosis not present

## 2019-03-15 DIAGNOSIS — Z800 Family history of malignant neoplasm of digestive organs: Secondary | ICD-10-CM | POA: Diagnosis not present

## 2019-03-15 DIAGNOSIS — Z87891 Personal history of nicotine dependence: Secondary | ICD-10-CM | POA: Diagnosis not present

## 2019-03-15 DIAGNOSIS — R0789 Other chest pain: Secondary | ICD-10-CM | POA: Diagnosis not present

## 2019-03-15 DIAGNOSIS — Z743 Need for continuous supervision: Secondary | ICD-10-CM | POA: Diagnosis not present

## 2019-03-15 DIAGNOSIS — I5032 Chronic diastolic (congestive) heart failure: Secondary | ICD-10-CM | POA: Diagnosis not present

## 2019-03-15 DIAGNOSIS — S199XXA Unspecified injury of neck, initial encounter: Secondary | ICD-10-CM | POA: Diagnosis not present

## 2019-03-15 DIAGNOSIS — Z7982 Long term (current) use of aspirin: Secondary | ICD-10-CM | POA: Diagnosis not present

## 2019-03-15 DIAGNOSIS — I110 Hypertensive heart disease with heart failure: Secondary | ICD-10-CM | POA: Diagnosis not present

## 2019-03-15 DIAGNOSIS — M1711 Unilateral primary osteoarthritis, right knee: Secondary | ICD-10-CM | POA: Diagnosis not present

## 2019-03-15 DIAGNOSIS — R079 Chest pain, unspecified: Secondary | ICD-10-CM | POA: Diagnosis not present

## 2019-03-15 DIAGNOSIS — J189 Pneumonia, unspecified organism: Secondary | ICD-10-CM | POA: Diagnosis not present

## 2019-03-15 DIAGNOSIS — Z96651 Presence of right artificial knee joint: Secondary | ICD-10-CM | POA: Diagnosis not present

## 2019-03-15 DIAGNOSIS — R404 Transient alteration of awareness: Secondary | ICD-10-CM | POA: Diagnosis not present

## 2019-03-15 DIAGNOSIS — E785 Hyperlipidemia, unspecified: Secondary | ICD-10-CM | POA: Diagnosis not present

## 2019-03-15 DIAGNOSIS — Z7902 Long term (current) use of antithrombotics/antiplatelets: Secondary | ICD-10-CM | POA: Diagnosis not present

## 2019-03-15 DIAGNOSIS — I2510 Atherosclerotic heart disease of native coronary artery without angina pectoris: Secondary | ICD-10-CM | POA: Diagnosis not present

## 2019-03-15 DIAGNOSIS — R0902 Hypoxemia: Secondary | ICD-10-CM | POA: Diagnosis not present

## 2019-03-15 DIAGNOSIS — Z86711 Personal history of pulmonary embolism: Secondary | ICD-10-CM | POA: Diagnosis not present

## 2019-03-15 DIAGNOSIS — R0602 Shortness of breath: Secondary | ICD-10-CM | POA: Diagnosis not present

## 2019-03-15 DIAGNOSIS — J449 Chronic obstructive pulmonary disease, unspecified: Secondary | ICD-10-CM | POA: Diagnosis not present

## 2019-03-16 DIAGNOSIS — G4733 Obstructive sleep apnea (adult) (pediatric): Secondary | ICD-10-CM | POA: Diagnosis not present

## 2019-03-16 DIAGNOSIS — Z86711 Personal history of pulmonary embolism: Secondary | ICD-10-CM | POA: Diagnosis not present

## 2019-03-16 DIAGNOSIS — Z7982 Long term (current) use of aspirin: Secondary | ICD-10-CM | POA: Diagnosis not present

## 2019-03-16 DIAGNOSIS — R296 Repeated falls: Secondary | ICD-10-CM | POA: Diagnosis not present

## 2019-03-16 DIAGNOSIS — R45851 Suicidal ideations: Secondary | ICD-10-CM | POA: Insufficient documentation

## 2019-03-16 DIAGNOSIS — M1711 Unilateral primary osteoarthritis, right knee: Secondary | ICD-10-CM | POA: Diagnosis not present

## 2019-03-16 DIAGNOSIS — K219 Gastro-esophageal reflux disease without esophagitis: Secondary | ICD-10-CM | POA: Diagnosis not present

## 2019-03-16 DIAGNOSIS — E1140 Type 2 diabetes mellitus with diabetic neuropathy, unspecified: Secondary | ICD-10-CM | POA: Diagnosis not present

## 2019-03-16 DIAGNOSIS — S0990XA Unspecified injury of head, initial encounter: Secondary | ICD-10-CM | POA: Diagnosis not present

## 2019-03-16 DIAGNOSIS — Z794 Long term (current) use of insulin: Secondary | ICD-10-CM | POA: Diagnosis not present

## 2019-03-16 DIAGNOSIS — J449 Chronic obstructive pulmonary disease, unspecified: Secondary | ICD-10-CM | POA: Diagnosis not present

## 2019-03-16 DIAGNOSIS — Z7902 Long term (current) use of antithrombotics/antiplatelets: Secondary | ICD-10-CM | POA: Diagnosis not present

## 2019-03-16 DIAGNOSIS — Z96651 Presence of right artificial knee joint: Secondary | ICD-10-CM | POA: Diagnosis not present

## 2019-03-16 DIAGNOSIS — R419 Unspecified symptoms and signs involving cognitive functions and awareness: Secondary | ICD-10-CM

## 2019-03-16 DIAGNOSIS — Z955 Presence of coronary angioplasty implant and graft: Secondary | ICD-10-CM | POA: Diagnosis not present

## 2019-03-16 DIAGNOSIS — Z800 Family history of malignant neoplasm of digestive organs: Secondary | ICD-10-CM | POA: Diagnosis not present

## 2019-03-16 DIAGNOSIS — Z20822 Contact with and (suspected) exposure to covid-19: Secondary | ICD-10-CM | POA: Diagnosis not present

## 2019-03-16 DIAGNOSIS — Z87891 Personal history of nicotine dependence: Secondary | ICD-10-CM | POA: Diagnosis not present

## 2019-03-16 DIAGNOSIS — I2510 Atherosclerotic heart disease of native coronary artery without angina pectoris: Secondary | ICD-10-CM | POA: Diagnosis not present

## 2019-03-16 DIAGNOSIS — J9621 Acute and chronic respiratory failure with hypoxia: Secondary | ICD-10-CM | POA: Diagnosis not present

## 2019-03-16 DIAGNOSIS — E119 Type 2 diabetes mellitus without complications: Secondary | ICD-10-CM | POA: Diagnosis not present

## 2019-03-16 DIAGNOSIS — I5032 Chronic diastolic (congestive) heart failure: Secondary | ICD-10-CM | POA: Diagnosis not present

## 2019-03-16 DIAGNOSIS — I110 Hypertensive heart disease with heart failure: Secondary | ICD-10-CM | POA: Diagnosis not present

## 2019-03-16 DIAGNOSIS — E785 Hyperlipidemia, unspecified: Secondary | ICD-10-CM | POA: Diagnosis not present

## 2019-03-16 HISTORY — DX: Unspecified symptoms and signs involving cognitive functions and awareness: R41.9

## 2019-03-16 HISTORY — DX: Suicidal ideations: R45.851

## 2019-03-17 DIAGNOSIS — Z7982 Long term (current) use of aspirin: Secondary | ICD-10-CM | POA: Diagnosis not present

## 2019-03-17 DIAGNOSIS — M1711 Unilateral primary osteoarthritis, right knee: Secondary | ICD-10-CM | POA: Diagnosis not present

## 2019-03-17 DIAGNOSIS — Z800 Family history of malignant neoplasm of digestive organs: Secondary | ICD-10-CM | POA: Diagnosis not present

## 2019-03-17 DIAGNOSIS — Z96651 Presence of right artificial knee joint: Secondary | ICD-10-CM | POA: Diagnosis not present

## 2019-03-17 DIAGNOSIS — I2510 Atherosclerotic heart disease of native coronary artery without angina pectoris: Secondary | ICD-10-CM | POA: Diagnosis not present

## 2019-03-17 DIAGNOSIS — Z87891 Personal history of nicotine dependence: Secondary | ICD-10-CM | POA: Diagnosis not present

## 2019-03-17 DIAGNOSIS — R296 Repeated falls: Secondary | ICD-10-CM | POA: Diagnosis not present

## 2019-03-17 DIAGNOSIS — E119 Type 2 diabetes mellitus without complications: Secondary | ICD-10-CM | POA: Diagnosis not present

## 2019-03-17 DIAGNOSIS — Z20822 Contact with and (suspected) exposure to covid-19: Secondary | ICD-10-CM | POA: Diagnosis not present

## 2019-03-17 DIAGNOSIS — Z794 Long term (current) use of insulin: Secondary | ICD-10-CM | POA: Diagnosis not present

## 2019-03-17 DIAGNOSIS — Z955 Presence of coronary angioplasty implant and graft: Secondary | ICD-10-CM | POA: Diagnosis not present

## 2019-03-17 DIAGNOSIS — I5032 Chronic diastolic (congestive) heart failure: Secondary | ICD-10-CM | POA: Diagnosis not present

## 2019-03-17 DIAGNOSIS — S0990XA Unspecified injury of head, initial encounter: Secondary | ICD-10-CM | POA: Diagnosis not present

## 2019-03-17 DIAGNOSIS — E785 Hyperlipidemia, unspecified: Secondary | ICD-10-CM | POA: Diagnosis not present

## 2019-03-17 DIAGNOSIS — Z86711 Personal history of pulmonary embolism: Secondary | ICD-10-CM | POA: Diagnosis not present

## 2019-03-17 DIAGNOSIS — I110 Hypertensive heart disease with heart failure: Secondary | ICD-10-CM | POA: Diagnosis not present

## 2019-03-17 DIAGNOSIS — J9611 Chronic respiratory failure with hypoxia: Secondary | ICD-10-CM | POA: Diagnosis not present

## 2019-03-17 DIAGNOSIS — K219 Gastro-esophageal reflux disease without esophagitis: Secondary | ICD-10-CM | POA: Diagnosis not present

## 2019-03-17 DIAGNOSIS — Z7902 Long term (current) use of antithrombotics/antiplatelets: Secondary | ICD-10-CM | POA: Diagnosis not present

## 2019-03-17 DIAGNOSIS — G4733 Obstructive sleep apnea (adult) (pediatric): Secondary | ICD-10-CM | POA: Diagnosis not present

## 2019-03-17 DIAGNOSIS — J449 Chronic obstructive pulmonary disease, unspecified: Secondary | ICD-10-CM | POA: Diagnosis not present

## 2019-03-18 DIAGNOSIS — E119 Type 2 diabetes mellitus without complications: Secondary | ICD-10-CM | POA: Diagnosis not present

## 2019-03-18 DIAGNOSIS — Z955 Presence of coronary angioplasty implant and graft: Secondary | ICD-10-CM | POA: Diagnosis not present

## 2019-03-18 DIAGNOSIS — Z7902 Long term (current) use of antithrombotics/antiplatelets: Secondary | ICD-10-CM | POA: Diagnosis not present

## 2019-03-18 DIAGNOSIS — I110 Hypertensive heart disease with heart failure: Secondary | ICD-10-CM | POA: Diagnosis not present

## 2019-03-18 DIAGNOSIS — Z800 Family history of malignant neoplasm of digestive organs: Secondary | ICD-10-CM | POA: Diagnosis not present

## 2019-03-18 DIAGNOSIS — J449 Chronic obstructive pulmonary disease, unspecified: Secondary | ICD-10-CM | POA: Diagnosis not present

## 2019-03-18 DIAGNOSIS — Z87891 Personal history of nicotine dependence: Secondary | ICD-10-CM | POA: Diagnosis not present

## 2019-03-18 DIAGNOSIS — Z7982 Long term (current) use of aspirin: Secondary | ICD-10-CM | POA: Diagnosis not present

## 2019-03-18 DIAGNOSIS — E785 Hyperlipidemia, unspecified: Secondary | ICD-10-CM | POA: Diagnosis not present

## 2019-03-18 DIAGNOSIS — I2510 Atherosclerotic heart disease of native coronary artery without angina pectoris: Secondary | ICD-10-CM | POA: Diagnosis not present

## 2019-03-18 DIAGNOSIS — Z96651 Presence of right artificial knee joint: Secondary | ICD-10-CM | POA: Diagnosis not present

## 2019-03-18 DIAGNOSIS — R296 Repeated falls: Secondary | ICD-10-CM | POA: Diagnosis not present

## 2019-03-18 DIAGNOSIS — S0990XA Unspecified injury of head, initial encounter: Secondary | ICD-10-CM | POA: Diagnosis not present

## 2019-03-18 DIAGNOSIS — M1711 Unilateral primary osteoarthritis, right knee: Secondary | ICD-10-CM | POA: Diagnosis not present

## 2019-03-18 DIAGNOSIS — K219 Gastro-esophageal reflux disease without esophagitis: Secondary | ICD-10-CM | POA: Diagnosis not present

## 2019-03-18 DIAGNOSIS — Z794 Long term (current) use of insulin: Secondary | ICD-10-CM | POA: Diagnosis not present

## 2019-03-18 DIAGNOSIS — G4733 Obstructive sleep apnea (adult) (pediatric): Secondary | ICD-10-CM | POA: Diagnosis not present

## 2019-03-18 DIAGNOSIS — J9611 Chronic respiratory failure with hypoxia: Secondary | ICD-10-CM | POA: Diagnosis not present

## 2019-03-18 DIAGNOSIS — Z20822 Contact with and (suspected) exposure to covid-19: Secondary | ICD-10-CM | POA: Diagnosis not present

## 2019-03-18 DIAGNOSIS — I5032 Chronic diastolic (congestive) heart failure: Secondary | ICD-10-CM | POA: Diagnosis not present

## 2019-03-18 DIAGNOSIS — Z86711 Personal history of pulmonary embolism: Secondary | ICD-10-CM | POA: Diagnosis not present

## 2019-03-19 DIAGNOSIS — G4733 Obstructive sleep apnea (adult) (pediatric): Secondary | ICD-10-CM | POA: Diagnosis not present

## 2019-03-19 DIAGNOSIS — Z794 Long term (current) use of insulin: Secondary | ICD-10-CM | POA: Diagnosis not present

## 2019-03-19 DIAGNOSIS — Z955 Presence of coronary angioplasty implant and graft: Secondary | ICD-10-CM | POA: Diagnosis not present

## 2019-03-19 DIAGNOSIS — Z86711 Personal history of pulmonary embolism: Secondary | ICD-10-CM | POA: Diagnosis not present

## 2019-03-19 DIAGNOSIS — Z7902 Long term (current) use of antithrombotics/antiplatelets: Secondary | ICD-10-CM | POA: Diagnosis not present

## 2019-03-19 DIAGNOSIS — I5032 Chronic diastolic (congestive) heart failure: Secondary | ICD-10-CM | POA: Diagnosis not present

## 2019-03-19 DIAGNOSIS — I2510 Atherosclerotic heart disease of native coronary artery without angina pectoris: Secondary | ICD-10-CM | POA: Diagnosis not present

## 2019-03-19 DIAGNOSIS — M1711 Unilateral primary osteoarthritis, right knee: Secondary | ICD-10-CM | POA: Diagnosis not present

## 2019-03-19 DIAGNOSIS — J449 Chronic obstructive pulmonary disease, unspecified: Secondary | ICD-10-CM | POA: Diagnosis not present

## 2019-03-19 DIAGNOSIS — S0990XA Unspecified injury of head, initial encounter: Secondary | ICD-10-CM | POA: Diagnosis not present

## 2019-03-19 DIAGNOSIS — Z800 Family history of malignant neoplasm of digestive organs: Secondary | ICD-10-CM | POA: Diagnosis not present

## 2019-03-19 DIAGNOSIS — Z96651 Presence of right artificial knee joint: Secondary | ICD-10-CM | POA: Diagnosis not present

## 2019-03-19 DIAGNOSIS — E119 Type 2 diabetes mellitus without complications: Secondary | ICD-10-CM | POA: Diagnosis not present

## 2019-03-19 DIAGNOSIS — R296 Repeated falls: Secondary | ICD-10-CM | POA: Diagnosis not present

## 2019-03-19 DIAGNOSIS — Z20822 Contact with and (suspected) exposure to covid-19: Secondary | ICD-10-CM | POA: Diagnosis not present

## 2019-03-19 DIAGNOSIS — Z7982 Long term (current) use of aspirin: Secondary | ICD-10-CM | POA: Diagnosis not present

## 2019-03-19 DIAGNOSIS — I110 Hypertensive heart disease with heart failure: Secondary | ICD-10-CM | POA: Diagnosis not present

## 2019-03-19 DIAGNOSIS — E785 Hyperlipidemia, unspecified: Secondary | ICD-10-CM | POA: Diagnosis not present

## 2019-03-19 DIAGNOSIS — Z87891 Personal history of nicotine dependence: Secondary | ICD-10-CM | POA: Diagnosis not present

## 2019-03-19 DIAGNOSIS — J9611 Chronic respiratory failure with hypoxia: Secondary | ICD-10-CM | POA: Diagnosis not present

## 2019-03-19 DIAGNOSIS — K219 Gastro-esophageal reflux disease without esophagitis: Secondary | ICD-10-CM | POA: Diagnosis not present

## 2019-03-20 DIAGNOSIS — Z794 Long term (current) use of insulin: Secondary | ICD-10-CM | POA: Diagnosis not present

## 2019-03-20 DIAGNOSIS — Z96651 Presence of right artificial knee joint: Secondary | ICD-10-CM | POA: Diagnosis not present

## 2019-03-20 DIAGNOSIS — Z87891 Personal history of nicotine dependence: Secondary | ICD-10-CM | POA: Diagnosis not present

## 2019-03-20 DIAGNOSIS — J449 Chronic obstructive pulmonary disease, unspecified: Secondary | ICD-10-CM | POA: Diagnosis not present

## 2019-03-20 DIAGNOSIS — S0990XA Unspecified injury of head, initial encounter: Secondary | ICD-10-CM | POA: Diagnosis not present

## 2019-03-20 DIAGNOSIS — M1711 Unilateral primary osteoarthritis, right knee: Secondary | ICD-10-CM | POA: Diagnosis not present

## 2019-03-20 DIAGNOSIS — R296 Repeated falls: Secondary | ICD-10-CM | POA: Diagnosis not present

## 2019-03-20 DIAGNOSIS — Z955 Presence of coronary angioplasty implant and graft: Secondary | ICD-10-CM | POA: Diagnosis not present

## 2019-03-20 DIAGNOSIS — K219 Gastro-esophageal reflux disease without esophagitis: Secondary | ICD-10-CM | POA: Diagnosis not present

## 2019-03-20 DIAGNOSIS — G4733 Obstructive sleep apnea (adult) (pediatric): Secondary | ICD-10-CM | POA: Diagnosis not present

## 2019-03-20 DIAGNOSIS — Z20822 Contact with and (suspected) exposure to covid-19: Secondary | ICD-10-CM | POA: Diagnosis not present

## 2019-03-20 DIAGNOSIS — E785 Hyperlipidemia, unspecified: Secondary | ICD-10-CM | POA: Diagnosis not present

## 2019-03-20 DIAGNOSIS — I110 Hypertensive heart disease with heart failure: Secondary | ICD-10-CM | POA: Diagnosis not present

## 2019-03-20 DIAGNOSIS — Z7902 Long term (current) use of antithrombotics/antiplatelets: Secondary | ICD-10-CM | POA: Diagnosis not present

## 2019-03-20 DIAGNOSIS — Z7982 Long term (current) use of aspirin: Secondary | ICD-10-CM | POA: Diagnosis not present

## 2019-03-20 DIAGNOSIS — Z86711 Personal history of pulmonary embolism: Secondary | ICD-10-CM | POA: Diagnosis not present

## 2019-03-20 DIAGNOSIS — E119 Type 2 diabetes mellitus without complications: Secondary | ICD-10-CM | POA: Diagnosis not present

## 2019-03-20 DIAGNOSIS — J9611 Chronic respiratory failure with hypoxia: Secondary | ICD-10-CM | POA: Diagnosis not present

## 2019-03-20 DIAGNOSIS — I5032 Chronic diastolic (congestive) heart failure: Secondary | ICD-10-CM | POA: Diagnosis not present

## 2019-03-20 DIAGNOSIS — I2510 Atherosclerotic heart disease of native coronary artery without angina pectoris: Secondary | ICD-10-CM | POA: Diagnosis not present

## 2019-03-20 DIAGNOSIS — Z800 Family history of malignant neoplasm of digestive organs: Secondary | ICD-10-CM | POA: Diagnosis not present

## 2019-03-21 DIAGNOSIS — Z20822 Contact with and (suspected) exposure to covid-19: Secondary | ICD-10-CM | POA: Diagnosis not present

## 2019-03-21 DIAGNOSIS — Z955 Presence of coronary angioplasty implant and graft: Secondary | ICD-10-CM | POA: Diagnosis not present

## 2019-03-21 DIAGNOSIS — Z87891 Personal history of nicotine dependence: Secondary | ICD-10-CM | POA: Diagnosis not present

## 2019-03-21 DIAGNOSIS — I5032 Chronic diastolic (congestive) heart failure: Secondary | ICD-10-CM | POA: Diagnosis not present

## 2019-03-21 DIAGNOSIS — M1711 Unilateral primary osteoarthritis, right knee: Secondary | ICD-10-CM | POA: Diagnosis not present

## 2019-03-21 DIAGNOSIS — Z794 Long term (current) use of insulin: Secondary | ICD-10-CM | POA: Diagnosis not present

## 2019-03-21 DIAGNOSIS — E785 Hyperlipidemia, unspecified: Secondary | ICD-10-CM | POA: Diagnosis not present

## 2019-03-21 DIAGNOSIS — J449 Chronic obstructive pulmonary disease, unspecified: Secondary | ICD-10-CM | POA: Diagnosis not present

## 2019-03-21 DIAGNOSIS — Z96651 Presence of right artificial knee joint: Secondary | ICD-10-CM | POA: Diagnosis not present

## 2019-03-21 DIAGNOSIS — Z86711 Personal history of pulmonary embolism: Secondary | ICD-10-CM | POA: Diagnosis not present

## 2019-03-21 DIAGNOSIS — J9611 Chronic respiratory failure with hypoxia: Secondary | ICD-10-CM | POA: Diagnosis not present

## 2019-03-21 DIAGNOSIS — R001 Bradycardia, unspecified: Secondary | ICD-10-CM | POA: Diagnosis not present

## 2019-03-21 DIAGNOSIS — E119 Type 2 diabetes mellitus without complications: Secondary | ICD-10-CM | POA: Diagnosis not present

## 2019-03-21 DIAGNOSIS — Z800 Family history of malignant neoplasm of digestive organs: Secondary | ICD-10-CM | POA: Diagnosis not present

## 2019-03-21 DIAGNOSIS — S0990XA Unspecified injury of head, initial encounter: Secondary | ICD-10-CM | POA: Diagnosis not present

## 2019-03-21 DIAGNOSIS — I110 Hypertensive heart disease with heart failure: Secondary | ICD-10-CM | POA: Diagnosis not present

## 2019-03-21 DIAGNOSIS — Z7982 Long term (current) use of aspirin: Secondary | ICD-10-CM | POA: Diagnosis not present

## 2019-03-21 DIAGNOSIS — Z7902 Long term (current) use of antithrombotics/antiplatelets: Secondary | ICD-10-CM | POA: Diagnosis not present

## 2019-03-21 DIAGNOSIS — G4733 Obstructive sleep apnea (adult) (pediatric): Secondary | ICD-10-CM | POA: Diagnosis not present

## 2019-03-21 DIAGNOSIS — R296 Repeated falls: Secondary | ICD-10-CM | POA: Diagnosis not present

## 2019-03-21 DIAGNOSIS — I2510 Atherosclerotic heart disease of native coronary artery without angina pectoris: Secondary | ICD-10-CM | POA: Diagnosis not present

## 2019-03-21 DIAGNOSIS — K219 Gastro-esophageal reflux disease without esophagitis: Secondary | ICD-10-CM | POA: Diagnosis not present

## 2019-03-21 MED ORDER — METOPROLOL TARTRATE 25 MG TABLET
ORAL_TABLET | Freq: Two times a day (BID) | ORAL | 0 refills | 30 days
Start: 2019-03-21 — End: 2019-04-20

## 2019-03-21 MED ORDER — ONDANSETRON 4 MG DISINTEGRATING TABLET
Freq: Three times a day (TID) | ORAL | 0 refills | 0 days | PRN
Start: 2019-03-21 — End: 2019-03-28

## 2019-03-21 MED ORDER — CARBOXYMETHYLCELLULOSE SODIUM 0.25 % EYE DROPS
Freq: Four times a day (QID) | OPHTHALMIC | 0 refills | 0.00000 days | PRN
Start: 2019-03-21 — End: ?

## 2019-03-21 MED ORDER — NICOTINE (POLACRILEX) 4 MG BUCCAL LOZENGE
BUCCAL | 0 refills | 5.00000 days | PRN
Start: 2019-03-21 — End: 2019-04-20

## 2019-03-21 MED ORDER — IPRATROPIUM 0.5 MG-ALBUTEROL 3 MG (2.5 MG BASE)/3 ML NEBULIZATION SOLN
Freq: Four times a day (QID) | RESPIRATORY_TRACT | 0 refills | 0.00000 days | PRN
Start: 2019-03-21 — End: ?

## 2019-03-21 MED ORDER — CETIRIZINE 10 MG TABLET
ORAL_TABLET | Freq: Every evening | ORAL | 11 refills | 30.00000 days
Start: 2019-03-21 — End: 2020-03-20

## 2019-03-21 MED ORDER — OXYCODONE 10 MG TABLET
ORAL_TABLET | ORAL | 0 refills | 2.00000 days | PRN
Start: 2019-03-21 — End: 2019-03-26

## 2019-03-21 MED ORDER — QUETIAPINE 25 MG TABLET
Freq: Two times a day (BID) | ORAL | 0 refills | 0 days | PRN
Start: 2019-03-21 — End: 2019-04-20

## 2019-03-21 MED ORDER — HYDROXYZINE HCL 25 MG TABLET
ORAL_CAPSULE | Freq: Four times a day (QID) | ORAL | 0 refills | 8 days | PRN
Start: 2019-03-21 — End: ?

## 2019-03-21 MED ORDER — CYCLOBENZAPRINE 5 MG TABLET
Freq: Three times a day (TID) | ORAL | 0 refills | 0.00000 days | PRN
Start: 2019-03-21 — End: ?

## 2019-03-22 ENCOUNTER — Ambulatory Visit
Admission: TF | Admit: 2019-03-22 | Discharge: 2019-03-25 | Disposition: A | Discharge status: Other Acute Inpatient Hospital | Payer: MEDICARE | Source: Intra-hospital | Admitting: Internal Medicine

## 2019-03-22 MED ORDER — INSULIN GLARGINE (U-100) 100 UNIT/ML SUBCUTANEOUS SOLUTION
Freq: Every day | SUBCUTANEOUS | 0 refills | 30.00000 days
Start: 2019-03-22 — End: 2019-04-21

## 2019-03-22 MED ORDER — FOLIC ACID 1 MG TABLET
ORAL_TABLET | Freq: Every day | ORAL | 11 refills | 30.00000 days
Start: 2019-03-22 — End: 2020-03-21

## 2019-03-22 MED ORDER — THIAMINE HCL (VITAMIN B1) 100 MG TABLET
ORAL_TABLET | Freq: Every day | ORAL | 11 refills | 30.00000 days
Start: 2019-03-22 — End: 2020-03-21

## 2019-03-22 MED ORDER — DULOXETINE 60 MG CAPSULE,DELAYED RELEASE
ORAL_CAPSULE | Freq: Every day | ORAL | 0 refills | 30.00000 days
Start: 2019-03-22 — End: 2019-04-21

## 2019-03-22 MED ORDER — TORSEMIDE 20 MG TABLET
ORAL_TABLET | Freq: Two times a day (BID) | ORAL | 0 refills | 30.00000 days
Start: 2019-03-22 — End: 2019-04-21

## 2019-03-25 ENCOUNTER — Ambulatory Visit
Admission: TF | Admit: 2019-03-25 | Discharge: 2019-03-27 | Disposition: A | Discharge status: Home / Self Care | Payer: MEDICARE | Source: Intra-hospital | Admitting: Internal Medicine

## 2019-03-25 DIAGNOSIS — Z7984 Long term (current) use of oral hypoglycemic drugs: Secondary | ICD-10-CM | POA: Diagnosis not present

## 2019-03-25 DIAGNOSIS — I2510 Atherosclerotic heart disease of native coronary artery without angina pectoris: Secondary | ICD-10-CM | POA: Diagnosis not present

## 2019-03-25 DIAGNOSIS — J449 Chronic obstructive pulmonary disease, unspecified: Secondary | ICD-10-CM | POA: Diagnosis not present

## 2019-03-25 DIAGNOSIS — I5030 Unspecified diastolic (congestive) heart failure: Secondary | ICD-10-CM | POA: Diagnosis not present

## 2019-03-25 DIAGNOSIS — I110 Hypertensive heart disease with heart failure: Secondary | ICD-10-CM | POA: Diagnosis not present

## 2019-03-25 DIAGNOSIS — Z7902 Long term (current) use of antithrombotics/antiplatelets: Secondary | ICD-10-CM | POA: Diagnosis not present

## 2019-03-25 DIAGNOSIS — I5032 Chronic diastolic (congestive) heart failure: Secondary | ICD-10-CM | POA: Diagnosis not present

## 2019-03-25 DIAGNOSIS — I10 Essential (primary) hypertension: Secondary | ICD-10-CM | POA: Diagnosis not present

## 2019-03-25 DIAGNOSIS — G4733 Obstructive sleep apnea (adult) (pediatric): Secondary | ICD-10-CM | POA: Diagnosis not present

## 2019-03-25 DIAGNOSIS — Z955 Presence of coronary angioplasty implant and graft: Secondary | ICD-10-CM | POA: Diagnosis not present

## 2019-03-25 DIAGNOSIS — E1142 Type 2 diabetes mellitus with diabetic polyneuropathy: Secondary | ICD-10-CM | POA: Diagnosis not present

## 2019-03-25 DIAGNOSIS — Z86711 Personal history of pulmonary embolism: Secondary | ICD-10-CM | POA: Diagnosis not present

## 2019-03-25 DIAGNOSIS — I509 Heart failure, unspecified: Secondary | ICD-10-CM | POA: Diagnosis not present

## 2019-03-25 DIAGNOSIS — Z7982 Long term (current) use of aspirin: Secondary | ICD-10-CM | POA: Diagnosis not present

## 2019-03-25 DIAGNOSIS — Z87891 Personal history of nicotine dependence: Secondary | ICD-10-CM | POA: Diagnosis not present

## 2019-03-25 DIAGNOSIS — E119 Type 2 diabetes mellitus without complications: Secondary | ICD-10-CM | POA: Diagnosis not present

## 2019-03-25 DIAGNOSIS — N179 Acute kidney failure, unspecified: Secondary | ICD-10-CM | POA: Diagnosis not present

## 2019-03-25 DIAGNOSIS — K219 Gastro-esophageal reflux disease without esophagitis: Secondary | ICD-10-CM | POA: Diagnosis not present

## 2019-03-25 DIAGNOSIS — E785 Hyperlipidemia, unspecified: Secondary | ICD-10-CM | POA: Diagnosis not present

## 2019-03-25 DIAGNOSIS — Z66 Do not resuscitate: Secondary | ICD-10-CM | POA: Diagnosis not present

## 2019-03-25 DIAGNOSIS — Z79899 Other long term (current) drug therapy: Secondary | ICD-10-CM | POA: Diagnosis not present

## 2019-03-25 MED ORDER — NICOTINE 21 MG/24 HR DAILY TRANSDERMAL PATCH
MEDICATED_PATCH | TRANSDERMAL | 0 refills | 14 days | Status: SS
Start: 2019-03-25 — End: ?

## 2019-03-25 MED ORDER — METOPROLOL TARTRATE 25 MG TABLET
ORAL_TABLET | Freq: Two times a day (BID) | ORAL | 0 refills | 30 days | Status: SS
Start: 2019-03-25 — End: 2019-04-24

## 2019-03-26 MED ORDER — ISOSORBIDE MONONITRATE ER 30 MG TABLET,EXTENDED RELEASE 24 HR
ORAL_TABLET | Freq: Every day | ORAL | 11 refills | 30 days | Status: SS
Start: 2019-03-26 — End: 2020-03-25

## 2019-03-26 MED ORDER — BUPROPION HCL XL 150 MG 24 HR TABLET, EXTENDED RELEASE
ORAL_TABLET | Freq: Every day | ORAL | 0 refills | 30 days | Status: SS
Start: 2019-03-26 — End: 2019-04-25

## 2019-03-27 MED ORDER — TORSEMIDE 20 MG TABLET
ORAL_TABLET | Freq: Every day | ORAL | 0 refills | 30.00000 days
Start: 2019-03-27 — End: 2019-04-26

## 2019-04-01 DIAGNOSIS — Z5181 Encounter for therapeutic drug level monitoring: Secondary | ICD-10-CM | POA: Diagnosis not present

## 2019-04-01 DIAGNOSIS — E1169 Type 2 diabetes mellitus with other specified complication: Secondary | ICD-10-CM | POA: Diagnosis not present

## 2019-04-01 DIAGNOSIS — N1831 Chronic kidney disease, stage 3a: Secondary | ICD-10-CM | POA: Diagnosis not present

## 2019-04-01 DIAGNOSIS — E782 Mixed hyperlipidemia: Secondary | ICD-10-CM | POA: Diagnosis not present

## 2019-04-01 DIAGNOSIS — M545 Low back pain: Secondary | ICD-10-CM | POA: Diagnosis not present

## 2019-04-01 DIAGNOSIS — G8929 Other chronic pain: Secondary | ICD-10-CM | POA: Diagnosis not present

## 2019-04-08 DIAGNOSIS — R079 Chest pain, unspecified: Principal | ICD-10-CM

## 2019-04-08 MED ORDER — ISOSORBIDE MONONITRATE ER 120 MG TABLET,EXTENDED RELEASE 24 HR
ORAL_TABLET | Freq: Every day | ORAL | 3 refills | 90 days | Status: CP
Start: 2019-04-08 — End: 2019-05-08

## 2019-04-21 MED FILL — REPATHA SURECLICK 140 MG/ML SUBCUTANEOUS PEN INJECTOR: SUBCUTANEOUS | 28 days supply | Qty: 2 | Fill #2

## 2019-04-21 MED FILL — REPATHA SURECLICK 140 MG/ML SUBCUTANEOUS PEN INJECTOR: 28 days supply | Qty: 2 | Fill #2 | Status: AC

## 2019-04-28 DIAGNOSIS — J9612 Chronic respiratory failure with hypercapnia: Secondary | ICD-10-CM | POA: Diagnosis not present

## 2019-04-29 DIAGNOSIS — M545 Low back pain: Secondary | ICD-10-CM | POA: Diagnosis not present

## 2019-04-29 DIAGNOSIS — M25561 Pain in right knee: Secondary | ICD-10-CM | POA: Diagnosis not present

## 2019-04-29 DIAGNOSIS — Z5181 Encounter for therapeutic drug level monitoring: Secondary | ICD-10-CM | POA: Diagnosis not present

## 2019-04-29 DIAGNOSIS — G8929 Other chronic pain: Secondary | ICD-10-CM | POA: Diagnosis not present

## 2019-04-29 DIAGNOSIS — E1169 Type 2 diabetes mellitus with other specified complication: Secondary | ICD-10-CM | POA: Diagnosis not present

## 2019-05-17 DIAGNOSIS — J309 Allergic rhinitis, unspecified: Secondary | ICD-10-CM | POA: Diagnosis not present

## 2019-05-17 DIAGNOSIS — M25519 Pain in unspecified shoulder: Secondary | ICD-10-CM | POA: Diagnosis not present

## 2019-05-25 MED FILL — REPATHA SURECLICK 140 MG/ML SUBCUTANEOUS PEN INJECTOR: SUBCUTANEOUS | 28 days supply | Qty: 2 | Fill #3

## 2019-05-25 MED FILL — REPATHA SURECLICK 140 MG/ML SUBCUTANEOUS PEN INJECTOR: 28 days supply | Qty: 2 | Fill #3 | Status: AC

## 2019-05-27 DIAGNOSIS — M25561 Pain in right knee: Secondary | ICD-10-CM | POA: Diagnosis not present

## 2019-05-27 DIAGNOSIS — E1169 Type 2 diabetes mellitus with other specified complication: Secondary | ICD-10-CM | POA: Diagnosis not present

## 2019-05-27 DIAGNOSIS — J9612 Chronic respiratory failure with hypercapnia: Secondary | ICD-10-CM | POA: Diagnosis not present

## 2019-05-27 DIAGNOSIS — Z5181 Encounter for therapeutic drug level monitoring: Secondary | ICD-10-CM | POA: Diagnosis not present

## 2019-05-27 DIAGNOSIS — M545 Low back pain: Secondary | ICD-10-CM | POA: Diagnosis not present

## 2019-05-27 DIAGNOSIS — G8929 Other chronic pain: Secondary | ICD-10-CM | POA: Diagnosis not present

## 2019-06-01 ENCOUNTER — Encounter: Admit: 2019-06-01 | Discharge: 2019-06-02 | Payer: MEDICARE

## 2019-06-07 DIAGNOSIS — J309 Allergic rhinitis, unspecified: Principal | ICD-10-CM

## 2019-06-21 DIAGNOSIS — I2510 Atherosclerotic heart disease of native coronary artery without angina pectoris: Principal | ICD-10-CM

## 2019-06-21 DIAGNOSIS — E785 Hyperlipidemia, unspecified: Principal | ICD-10-CM

## 2019-06-21 MED ORDER — REPATHA SURECLICK 140 MG/ML SUBCUTANEOUS PEN INJECTOR
SUBCUTANEOUS | 1 refills | 56.00000 days | Status: CP
Start: 2019-06-21 — End: ?
  Filled 2019-06-27: qty 2, 28d supply, fill #0

## 2019-06-24 DIAGNOSIS — M545 Low back pain: Secondary | ICD-10-CM | POA: Diagnosis not present

## 2019-06-24 DIAGNOSIS — N1831 Chronic kidney disease, stage 3a: Secondary | ICD-10-CM | POA: Diagnosis not present

## 2019-06-24 DIAGNOSIS — Z5181 Encounter for therapeutic drug level monitoring: Secondary | ICD-10-CM | POA: Diagnosis not present

## 2019-06-24 DIAGNOSIS — G8929 Other chronic pain: Secondary | ICD-10-CM | POA: Diagnosis not present

## 2019-06-24 DIAGNOSIS — M25561 Pain in right knee: Secondary | ICD-10-CM | POA: Diagnosis not present

## 2019-06-26 DIAGNOSIS — J9612 Chronic respiratory failure with hypercapnia: Secondary | ICD-10-CM | POA: Diagnosis not present

## 2019-06-27 MED FILL — REPATHA SURECLICK 140 MG/ML SUBCUTANEOUS PEN INJECTOR: 28 days supply | Qty: 2 | Fill #0 | Status: AC

## 2019-06-30 DIAGNOSIS — Z1152 Encounter for screening for COVID-19: Secondary | ICD-10-CM | POA: Diagnosis not present

## 2019-06-30 DIAGNOSIS — J40 Bronchitis, not specified as acute or chronic: Secondary | ICD-10-CM | POA: Diagnosis not present

## 2019-06-30 DIAGNOSIS — R05 Cough: Secondary | ICD-10-CM | POA: Diagnosis not present

## 2019-07-15 DIAGNOSIS — R05 Cough: Secondary | ICD-10-CM | POA: Diagnosis not present

## 2019-07-15 DIAGNOSIS — R0689 Other abnormalities of breathing: Secondary | ICD-10-CM | POA: Diagnosis not present

## 2019-07-15 DIAGNOSIS — R0989 Other specified symptoms and signs involving the circulatory and respiratory systems: Secondary | ICD-10-CM | POA: Diagnosis not present

## 2019-07-19 DIAGNOSIS — R0989 Other specified symptoms and signs involving the circulatory and respiratory systems: Secondary | ICD-10-CM | POA: Diagnosis not present

## 2019-07-19 DIAGNOSIS — R05 Cough: Secondary | ICD-10-CM | POA: Diagnosis not present

## 2019-07-22 DIAGNOSIS — M25562 Pain in left knee: Secondary | ICD-10-CM | POA: Diagnosis not present

## 2019-07-22 DIAGNOSIS — Z5181 Encounter for therapeutic drug level monitoring: Secondary | ICD-10-CM | POA: Diagnosis not present

## 2019-07-22 DIAGNOSIS — M545 Low back pain: Secondary | ICD-10-CM | POA: Diagnosis not present

## 2019-07-22 DIAGNOSIS — G8929 Other chronic pain: Secondary | ICD-10-CM | POA: Diagnosis not present

## 2019-07-22 DIAGNOSIS — M25561 Pain in right knee: Secondary | ICD-10-CM | POA: Diagnosis not present

## 2019-07-27 DIAGNOSIS — J9612 Chronic respiratory failure with hypercapnia: Secondary | ICD-10-CM | POA: Diagnosis not present

## 2019-07-27 MED ORDER — METOPROLOL TARTRATE 50 MG TABLET
Freq: Every day | ORAL | 0 days
Start: 2019-07-27 — End: ?

## 2019-07-29 MED FILL — REPATHA SURECLICK 140 MG/ML SUBCUTANEOUS PEN INJECTOR: 28 days supply | Qty: 2 | Fill #1 | Status: AC

## 2019-07-29 MED FILL — REPATHA SURECLICK 140 MG/ML SUBCUTANEOUS PEN INJECTOR: SUBCUTANEOUS | 28 days supply | Qty: 2 | Fill #1

## 2019-08-01 MED ORDER — METFORMIN 1,000 MG TABLET
0.00000 days
Start: 2019-08-01 — End: ?

## 2019-08-11 ENCOUNTER — Ambulatory Visit: Admit: 2019-08-11 | Discharge: 2019-08-12 | Payer: MEDICARE

## 2019-08-11 DIAGNOSIS — J309 Allergic rhinitis, unspecified: Principal | ICD-10-CM

## 2019-08-11 DIAGNOSIS — J310 Chronic rhinitis: Principal | ICD-10-CM

## 2019-08-11 DIAGNOSIS — R062 Wheezing: Principal | ICD-10-CM

## 2019-08-11 MED ORDER — AZELASTINE 137 MCG (0.1 %) NASAL SPRAY AEROSOL
Freq: Two times a day (BID) | NASAL | 12 refills | 0.00000 days | Status: CP
Start: 2019-08-11 — End: ?

## 2019-08-11 MED ORDER — IPRATROPIUM BROMIDE 21 MCG (0.03 %) NASAL SPRAY
12 refills | 0 days | Status: CP
Start: 2019-08-11 — End: ?

## 2019-08-22 DIAGNOSIS — M25562 Pain in left knee: Secondary | ICD-10-CM | POA: Diagnosis not present

## 2019-08-22 DIAGNOSIS — Z5181 Encounter for therapeutic drug level monitoring: Secondary | ICD-10-CM | POA: Diagnosis not present

## 2019-08-22 DIAGNOSIS — M25561 Pain in right knee: Secondary | ICD-10-CM | POA: Diagnosis not present

## 2019-08-22 DIAGNOSIS — G894 Chronic pain syndrome: Secondary | ICD-10-CM | POA: Diagnosis not present

## 2019-08-22 DIAGNOSIS — K5903 Drug induced constipation: Secondary | ICD-10-CM | POA: Diagnosis not present

## 2019-08-26 DIAGNOSIS — J9612 Chronic respiratory failure with hypercapnia: Secondary | ICD-10-CM | POA: Diagnosis not present

## 2019-08-29 DIAGNOSIS — M25561 Pain in right knee: Principal | ICD-10-CM

## 2019-08-29 DIAGNOSIS — M25562 Pain in left knee: Principal | ICD-10-CM

## 2019-08-29 DIAGNOSIS — G8929 Other chronic pain: Principal | ICD-10-CM

## 2019-08-29 DIAGNOSIS — M545 Low back pain: Principal | ICD-10-CM

## 2019-08-31 DIAGNOSIS — I2510 Atherosclerotic heart disease of native coronary artery without angina pectoris: Principal | ICD-10-CM

## 2019-09-06 MED FILL — REPATHA SURECLICK 140 MG/ML SUBCUTANEOUS PEN INJECTOR: 28 days supply | Qty: 2 | Fill #2 | Status: AC

## 2019-09-06 MED FILL — REPATHA SURECLICK 140 MG/ML SUBCUTANEOUS PEN INJECTOR: SUBCUTANEOUS | 28 days supply | Qty: 2 | Fill #2

## 2019-09-08 ENCOUNTER — Ambulatory Visit: Admit: 2019-09-08 | Discharge: 2019-09-09 | Payer: MEDICARE

## 2019-09-08 DIAGNOSIS — R05 Cough: Principal | ICD-10-CM

## 2019-09-08 DIAGNOSIS — R062 Wheezing: Principal | ICD-10-CM

## 2019-09-09 ENCOUNTER — Encounter: Admit: 2019-09-09 | Discharge: 2019-09-10 | Payer: MEDICARE | Attending: Pulmonary Disease | Primary: Pulmonary Disease

## 2019-09-09 DIAGNOSIS — J449 Chronic obstructive pulmonary disease, unspecified: Principal | ICD-10-CM

## 2019-09-09 DIAGNOSIS — R062 Wheezing: Principal | ICD-10-CM

## 2019-09-09 DIAGNOSIS — G4733 Obstructive sleep apnea (adult) (pediatric): Secondary | ICD-10-CM | POA: Diagnosis not present

## 2019-09-09 DIAGNOSIS — I5030 Unspecified diastolic (congestive) heart failure: Secondary | ICD-10-CM | POA: Diagnosis not present

## 2019-09-09 DIAGNOSIS — E119 Type 2 diabetes mellitus without complications: Secondary | ICD-10-CM | POA: Diagnosis not present

## 2019-09-09 MED ORDER — AEROCHAMBER MV SPACER
Freq: Four times a day (QID) | 0 refills | 1 days | Status: CP | PRN
Start: 2019-09-09 — End: ?

## 2019-09-20 DIAGNOSIS — E119 Type 2 diabetes mellitus without complications: Secondary | ICD-10-CM | POA: Diagnosis not present

## 2019-09-20 DIAGNOSIS — K5903 Drug induced constipation: Secondary | ICD-10-CM | POA: Diagnosis not present

## 2019-09-20 DIAGNOSIS — M25561 Pain in right knee: Secondary | ICD-10-CM | POA: Diagnosis not present

## 2019-09-20 DIAGNOSIS — M25562 Pain in left knee: Secondary | ICD-10-CM | POA: Diagnosis not present

## 2019-09-20 DIAGNOSIS — Z5181 Encounter for therapeutic drug level monitoring: Secondary | ICD-10-CM | POA: Diagnosis not present

## 2019-09-20 DIAGNOSIS — G894 Chronic pain syndrome: Secondary | ICD-10-CM | POA: Diagnosis not present

## 2019-09-26 DIAGNOSIS — J9612 Chronic respiratory failure with hypercapnia: Secondary | ICD-10-CM | POA: Diagnosis not present

## 2019-10-04 MED FILL — REPATHA SURECLICK 140 MG/ML SUBCUTANEOUS PEN INJECTOR: 28 days supply | Qty: 2 | Fill #3 | Status: AC

## 2019-10-04 MED FILL — REPATHA SURECLICK 140 MG/ML SUBCUTANEOUS PEN INJECTOR: SUBCUTANEOUS | 28 days supply | Qty: 2 | Fill #3

## 2019-10-06 ENCOUNTER — Ambulatory Visit: Admit: 2019-10-06 | Discharge: 2019-10-07 | Payer: MEDICARE

## 2019-10-06 ENCOUNTER — Encounter: Admit: 2019-10-06 | Discharge: 2019-10-07 | Payer: MEDICARE

## 2019-10-06 DIAGNOSIS — M160 Bilateral primary osteoarthritis of hip: Secondary | ICD-10-CM | POA: Diagnosis not present

## 2019-10-06 DIAGNOSIS — M25551 Pain in right hip: Secondary | ICD-10-CM | POA: Diagnosis not present

## 2019-10-06 DIAGNOSIS — M1711 Unilateral primary osteoarthritis, right knee: Secondary | ICD-10-CM | POA: Diagnosis not present

## 2019-10-06 DIAGNOSIS — M1611 Unilateral primary osteoarthritis, right hip: Secondary | ICD-10-CM | POA: Diagnosis not present

## 2019-10-06 DIAGNOSIS — G8929 Other chronic pain: Secondary | ICD-10-CM | POA: Diagnosis not present

## 2019-10-06 DIAGNOSIS — M545 Low back pain: Secondary | ICD-10-CM | POA: Diagnosis not present

## 2019-10-06 DIAGNOSIS — M25561 Pain in right knee: Secondary | ICD-10-CM | POA: Diagnosis not present

## 2019-10-06 DIAGNOSIS — M25562 Pain in left knee: Secondary | ICD-10-CM | POA: Diagnosis not present

## 2019-10-18 DIAGNOSIS — E119 Type 2 diabetes mellitus without complications: Secondary | ICD-10-CM | POA: Diagnosis not present

## 2019-10-18 DIAGNOSIS — F17200 Nicotine dependence, unspecified, uncomplicated: Secondary | ICD-10-CM | POA: Diagnosis not present

## 2019-10-18 DIAGNOSIS — E782 Mixed hyperlipidemia: Secondary | ICD-10-CM | POA: Diagnosis not present

## 2019-10-18 DIAGNOSIS — R413 Other amnesia: Secondary | ICD-10-CM | POA: Diagnosis not present

## 2019-10-18 DIAGNOSIS — Z5181 Encounter for therapeutic drug level monitoring: Secondary | ICD-10-CM | POA: Diagnosis not present

## 2019-10-18 MED ORDER — AMITRIPTYLINE 25 MG TABLET
0.00000 days
Start: 2019-10-18 — End: ?

## 2019-10-26 DIAGNOSIS — I2510 Atherosclerotic heart disease of native coronary artery without angina pectoris: Principal | ICD-10-CM

## 2019-10-26 DIAGNOSIS — E785 Hyperlipidemia, unspecified: Principal | ICD-10-CM

## 2019-10-26 MED ORDER — OXYCODONE-ACETAMINOPHEN 10 MG-325 MG TABLET
Freq: Four times a day (QID) | ORAL | 0.00000 days | PRN
Start: 2019-10-26 — End: ?

## 2019-10-26 MED ORDER — REPATHA SURECLICK 140 MG/ML SUBCUTANEOUS PEN INJECTOR
SUBCUTANEOUS | 1 refills | 56.00000 days
Start: 2019-10-26 — End: ?

## 2019-10-27 DIAGNOSIS — J9612 Chronic respiratory failure with hypercapnia: Secondary | ICD-10-CM | POA: Diagnosis not present

## 2019-10-28 MED ORDER — REPATHA SURECLICK 140 MG/ML SUBCUTANEOUS PEN INJECTOR
SUBCUTANEOUS | 6 refills | 56 days | Status: CP
Start: 2019-10-28 — End: ?
  Filled 2019-11-07: qty 4, 56d supply, fill #0

## 2019-11-07 MED FILL — REPATHA SURECLICK 140 MG/ML SUBCUTANEOUS PEN INJECTOR: 56 days supply | Qty: 4 | Fill #0 | Status: AC

## 2019-11-08 ENCOUNTER — Encounter: Admit: 2019-11-08 | Discharge: 2019-11-09 | Payer: MEDICARE

## 2019-11-08 DIAGNOSIS — I5032 Chronic diastolic (congestive) heart failure: Principal | ICD-10-CM

## 2019-11-10 ENCOUNTER — Ambulatory Visit: Admit: 2019-11-10 | Payer: MEDICARE

## 2019-11-15 DIAGNOSIS — G2581 Restless legs syndrome: Secondary | ICD-10-CM | POA: Diagnosis not present

## 2019-11-15 DIAGNOSIS — I129 Hypertensive chronic kidney disease with stage 1 through stage 4 chronic kidney disease, or unspecified chronic kidney disease: Secondary | ICD-10-CM | POA: Diagnosis not present

## 2019-11-15 DIAGNOSIS — E1122 Type 2 diabetes mellitus with diabetic chronic kidney disease: Secondary | ICD-10-CM | POA: Diagnosis not present

## 2019-11-15 DIAGNOSIS — Z5181 Encounter for therapeutic drug level monitoring: Secondary | ICD-10-CM | POA: Diagnosis not present

## 2019-11-15 DIAGNOSIS — Z23 Encounter for immunization: Secondary | ICD-10-CM | POA: Diagnosis not present

## 2019-11-15 DIAGNOSIS — G894 Chronic pain syndrome: Secondary | ICD-10-CM | POA: Diagnosis not present

## 2019-11-15 DIAGNOSIS — R5383 Other fatigue: Secondary | ICD-10-CM | POA: Diagnosis not present

## 2019-11-22 DIAGNOSIS — G894 Chronic pain syndrome: Principal | ICD-10-CM

## 2019-11-22 DIAGNOSIS — E1122 Type 2 diabetes mellitus with diabetic chronic kidney disease: Principal | ICD-10-CM

## 2019-11-22 DIAGNOSIS — I129 Hypertensive chronic kidney disease with stage 1 through stage 4 chronic kidney disease, or unspecified chronic kidney disease: Principal | ICD-10-CM

## 2019-11-22 DIAGNOSIS — G2581 Restless legs syndrome: Principal | ICD-10-CM

## 2019-11-22 DIAGNOSIS — R5383 Other fatigue: Principal | ICD-10-CM

## 2019-11-24 ENCOUNTER — Encounter
Admit: 2019-11-24 | Discharge: 2019-11-25 | Disposition: A | Discharge status: Home / Self Care | Payer: MEDICARE | Attending: Family Medicine

## 2019-11-24 DIAGNOSIS — S39012A Strain of muscle, fascia and tendon of lower back, initial encounter: Principal | ICD-10-CM

## 2019-11-24 DIAGNOSIS — N189 Chronic kidney disease, unspecified: Secondary | ICD-10-CM | POA: Diagnosis not present

## 2019-11-24 DIAGNOSIS — E1122 Type 2 diabetes mellitus with diabetic chronic kidney disease: Secondary | ICD-10-CM | POA: Diagnosis not present

## 2019-11-24 DIAGNOSIS — Z885 Allergy status to narcotic agent status: Secondary | ICD-10-CM | POA: Diagnosis not present

## 2019-11-24 DIAGNOSIS — Z955 Presence of coronary angioplasty implant and graft: Secondary | ICD-10-CM | POA: Diagnosis not present

## 2019-11-24 DIAGNOSIS — I2510 Atherosclerotic heart disease of native coronary artery without angina pectoris: Secondary | ICD-10-CM | POA: Diagnosis not present

## 2019-11-24 DIAGNOSIS — J449 Chronic obstructive pulmonary disease, unspecified: Secondary | ICD-10-CM | POA: Diagnosis not present

## 2019-11-24 DIAGNOSIS — I509 Heart failure, unspecified: Secondary | ICD-10-CM | POA: Diagnosis not present

## 2019-11-24 DIAGNOSIS — F17220 Nicotine dependence, chewing tobacco, uncomplicated: Secondary | ICD-10-CM | POA: Diagnosis not present

## 2019-11-24 DIAGNOSIS — Z79891 Long term (current) use of opiate analgesic: Secondary | ICD-10-CM | POA: Diagnosis not present

## 2019-11-24 DIAGNOSIS — Z79899 Other long term (current) drug therapy: Secondary | ICD-10-CM | POA: Diagnosis not present

## 2019-11-24 DIAGNOSIS — Z7902 Long term (current) use of antithrombotics/antiplatelets: Secondary | ICD-10-CM | POA: Diagnosis not present

## 2019-11-24 DIAGNOSIS — G8929 Other chronic pain: Secondary | ICD-10-CM | POA: Diagnosis not present

## 2019-11-24 DIAGNOSIS — I130 Hypertensive heart and chronic kidney disease with heart failure and stage 1 through stage 4 chronic kidney disease, or unspecified chronic kidney disease: Secondary | ICD-10-CM | POA: Diagnosis not present

## 2019-11-24 DIAGNOSIS — Z86711 Personal history of pulmonary embolism: Secondary | ICD-10-CM | POA: Diagnosis not present

## 2019-11-24 DIAGNOSIS — M545 Low back pain, unspecified: Secondary | ICD-10-CM | POA: Diagnosis not present

## 2019-11-24 DIAGNOSIS — E785 Hyperlipidemia, unspecified: Secondary | ICD-10-CM | POA: Diagnosis not present

## 2019-11-24 DIAGNOSIS — Z7982 Long term (current) use of aspirin: Secondary | ICD-10-CM | POA: Diagnosis not present

## 2019-11-24 DIAGNOSIS — Z794 Long term (current) use of insulin: Secondary | ICD-10-CM | POA: Diagnosis not present

## 2019-11-24 MED ORDER — OXYCODONE 5 MG TABLET
ORAL_TABLET | Freq: Four times a day (QID) | ORAL | 0 refills | 1 days | Status: CP | PRN
Start: 2019-11-24 — End: 2019-11-29

## 2019-11-24 MED ORDER — TIZANIDINE 4 MG TABLET
ORAL_TABLET | 0 refills | 0 days | Status: CP
Start: 2019-11-24 — End: ?

## 2019-11-26 DIAGNOSIS — J9612 Chronic respiratory failure with hypercapnia: Secondary | ICD-10-CM | POA: Diagnosis not present

## 2019-11-29 DIAGNOSIS — W19XXXD Unspecified fall, subsequent encounter: Secondary | ICD-10-CM | POA: Diagnosis not present

## 2019-11-29 DIAGNOSIS — M62838 Other muscle spasm: Secondary | ICD-10-CM | POA: Diagnosis not present

## 2019-12-16 DIAGNOSIS — E1122 Type 2 diabetes mellitus with diabetic chronic kidney disease: Secondary | ICD-10-CM | POA: Diagnosis not present

## 2019-12-16 DIAGNOSIS — I129 Hypertensive chronic kidney disease with stage 1 through stage 4 chronic kidney disease, or unspecified chronic kidney disease: Secondary | ICD-10-CM | POA: Diagnosis not present

## 2019-12-16 DIAGNOSIS — G894 Chronic pain syndrome: Secondary | ICD-10-CM | POA: Diagnosis not present

## 2019-12-16 DIAGNOSIS — Z5181 Encounter for therapeutic drug level monitoring: Secondary | ICD-10-CM | POA: Diagnosis not present

## 2019-12-16 DIAGNOSIS — N1832 Chronic kidney disease, stage 3b: Secondary | ICD-10-CM | POA: Diagnosis not present

## 2019-12-27 DIAGNOSIS — J9612 Chronic respiratory failure with hypercapnia: Secondary | ICD-10-CM | POA: Diagnosis not present

## 2019-12-28 DIAGNOSIS — N1832 Stage 3b chronic kidney disease (CMS-HCC): Principal | ICD-10-CM

## 2020-01-10 ENCOUNTER — Encounter: Admit: 2020-01-10 | Discharge: 2020-01-11 | Payer: MEDICARE | Attending: Pulmonary Disease | Primary: Pulmonary Disease

## 2020-01-10 DIAGNOSIS — R0689 Other abnormalities of breathing: Principal | ICD-10-CM

## 2020-01-10 DIAGNOSIS — I5030 Unspecified diastolic (congestive) heart failure: Secondary | ICD-10-CM | POA: Diagnosis not present

## 2020-01-10 DIAGNOSIS — E119 Type 2 diabetes mellitus without complications: Secondary | ICD-10-CM | POA: Diagnosis not present

## 2020-01-10 DIAGNOSIS — I2510 Atherosclerotic heart disease of native coronary artery without angina pectoris: Secondary | ICD-10-CM | POA: Diagnosis not present

## 2020-01-10 DIAGNOSIS — G4733 Obstructive sleep apnea (adult) (pediatric): Secondary | ICD-10-CM | POA: Diagnosis not present

## 2020-01-18 DIAGNOSIS — Z79899 Other long term (current) drug therapy: Secondary | ICD-10-CM | POA: Diagnosis not present

## 2020-01-18 DIAGNOSIS — M545 Low back pain, unspecified: Secondary | ICD-10-CM | POA: Diagnosis not present

## 2020-01-18 DIAGNOSIS — Z5181 Encounter for therapeutic drug level monitoring: Secondary | ICD-10-CM | POA: Diagnosis not present

## 2020-01-18 DIAGNOSIS — G8929 Other chronic pain: Secondary | ICD-10-CM | POA: Diagnosis not present

## 2020-01-26 DIAGNOSIS — L601 Onycholysis: Secondary | ICD-10-CM | POA: Diagnosis not present

## 2020-01-26 DIAGNOSIS — R7889 Finding of other specified substances, not normally found in blood: Secondary | ICD-10-CM | POA: Diagnosis not present

## 2020-01-26 DIAGNOSIS — E1142 Type 2 diabetes mellitus with diabetic polyneuropathy: Secondary | ICD-10-CM | POA: Diagnosis not present

## 2020-01-26 DIAGNOSIS — J9612 Chronic respiratory failure with hypercapnia: Secondary | ICD-10-CM | POA: Diagnosis not present

## 2020-01-31 MED FILL — REPATHA SURECLICK 140 MG/ML SUBCUTANEOUS PEN INJECTOR: SUBCUTANEOUS | 56 days supply | Qty: 4 | Fill #1

## 2020-01-31 MED FILL — REPATHA SURECLICK 140 MG/ML SUBCUTANEOUS PEN INJECTOR: 56 days supply | Qty: 4 | Fill #1 | Status: AC

## 2020-02-07 ENCOUNTER — Encounter: Payer: Self-pay | Admitting: Sports Medicine

## 2020-02-07 ENCOUNTER — Ambulatory Visit (INDEPENDENT_AMBULATORY_CARE_PROVIDER_SITE_OTHER): Payer: Medicare Other | Admitting: Sports Medicine

## 2020-02-07 ENCOUNTER — Other Ambulatory Visit: Payer: Self-pay

## 2020-02-07 DIAGNOSIS — M79675 Pain in left toe(s): Secondary | ICD-10-CM | POA: Diagnosis not present

## 2020-02-07 DIAGNOSIS — M79674 Pain in right toe(s): Secondary | ICD-10-CM | POA: Diagnosis not present

## 2020-02-07 DIAGNOSIS — E1100 Type 2 diabetes mellitus with hyperosmolarity without nonketotic hyperglycemic-hyperosmolar coma (NKHHC): Secondary | ICD-10-CM | POA: Diagnosis not present

## 2020-02-07 DIAGNOSIS — L601 Onycholysis: Secondary | ICD-10-CM | POA: Diagnosis not present

## 2020-02-07 DIAGNOSIS — Z7901 Long term (current) use of anticoagulants: Secondary | ICD-10-CM | POA: Diagnosis not present

## 2020-02-07 MED ORDER — AMOXICILLIN-POT CLAVULANATE 875-125 MG PO TABS: 1.0000 | ORAL_TABLET | Freq: Two times a day (BID) | ORAL | 0 refills | Status: DC

## 2020-02-07 NOTE — Progress Notes (Signed)
Subjective: Benjamin Bush is a 64 y.o. male patient presents to office today complaining of a moderately painful lifting right great toenail reports that he has had 3-4 times over the past 2 years but it still keeps coming back very thick this last time he could have bumped the toenail and it started bleeding but is not sure because he suffers from neuropathy.  Patient is also on blood thinner and last blood sugar recorded at 115. Patient denies fever/chills/nausea/vomitting/any other related constitutional symptoms at this time.  Review of system noncontributory  Patient Active Problem List   Diagnosis Date Noted   CAD (coronary artery disease) 11/22/2016   Chronic diastolic CHF (congestive heart failure) (HCC) 11/22/2016   Obstructive sleep apnea 11/22/2016   Type 2 diabetes mellitus (HCC) 11/22/2016   Pulmonary embolism (HCC) 11/22/2016   Obesity hypoventilation syndrome (HCC)    Unstable angina (HCC) 02/24/2013    Current Outpatient Medications on File Prior to Visit  Medication Sig Dispense Refill   acetaminophen (TYLENOL) 500 MG tablet Take by mouth.     amitriptyline (ELAVIL) 25 MG tablet      amLODipine (NORVASC) 10 MG tablet Take by mouth.     Carboxymethylcellulose Sodium 0.25 % SOLN Administer 2 drops to both eyes 4 (four) times a day as needed.     cetirizine (ZYRTEC) 10 MG tablet Take by mouth.     donepezil (ARICEPT) 10 MG tablet Take by mouth.     Evolocumab (REPATHA SURECLICK) 140 MG/ML SOAJ Inject into the skin.     famotidine (PEPCID) 40 MG tablet Take by mouth.     fexofenadine (ALLEGRA) 180 MG tablet Take by mouth.     folic acid (FOLVITE) 1 MG tablet Take by mouth.     HYDROcodone-acetaminophen (NORCO) 10-325 MG tablet 4 (four) times daily.     hydrOXYzine (ATARAX/VISTARIL) 25 MG tablet Take by mouth.     insulin NPH Human (NOVOLIN N) 100 UNIT/ML injection Inject into the skin.     Insulin Syringe-Needle U-100 (BD VEO INSULIN SYRINGE U/F) 31G X  15/64" 0.5 ML MISC      ipratropium (ATROVENT) 0.03 % nasal spray 1-2 sprays per nostril up to 4 times daily as needed for runny nose or post nasal drip     ipratropium-albuterol (DUONEB) 0.5-2.5 (3) MG/3ML SOLN Inhale into the lungs.     magnesium oxide (MAG-OX) 400 MG tablet Take by mouth.     PARoxetine (PAXIL) 20 MG tablet Take 1 tablet by mouth daily.     ramipril (ALTACE) 2.5 MG capsule Take by mouth.     ranolazine (RANEXA) 1000 MG SR tablet Take by mouth.     thiamine 100 MG tablet Take by mouth.     tiZANidine (ZANAFLEX) 4 MG tablet Instructions 1/2-1 tablet up to every 8 hours as needed for muscle spasm     aspirin EC 81 MG tablet Take 81 mg by mouth daily.     atorvastatin (LIPITOR) 80 MG tablet Take 1 tablet (80 mg total) by mouth daily. 30 tablet 0   buPROPion (WELLBUTRIN) 100 MG tablet Take by mouth.     Cholecalciferol (VITAMIN D3) 1000 units CAPS Take 1,000 mg by mouth daily.     clopidogrel (PLAVIX) 75 MG tablet Take 75 mg by mouth daily with breakfast.     cyclobenzaprine (FLEXERIL) 5 MG tablet Take 5 mg by mouth 3 (three) times daily as needed.     diclofenac sodium (VOLTAREN) 1 % GEL Apply 4 g  topically 4 (four) times daily as needed.     DULoxetine (CYMBALTA) 30 MG capsule Take by mouth.     fluticasone (FLONASE) 50 MCG/ACT nasal spray Place 2 sprays into the nose daily as needed.     glipiZIDE (GLUCOTROL XL) 10 MG 24 hr tablet Take 10 mg by mouth daily with breakfast.     isosorbide mononitrate (IMDUR) 60 MG 24 hr tablet Take 60 mg by mouth daily.     lisinopril (ZESTRIL) 10 MG tablet Take by mouth.     metFORMIN (GLUCOPHAGE) 1000 MG tablet Take 1,000 mg by mouth 2 (two) times daily with a meal.     metFORMIN (GLUCOPHAGE) 500 MG tablet Take 500 mg by mouth 2 (two) times daily.     methylPREDNISolone (MEDROL DOSEPAK) 4 MG TBPK tablet Take by mouth.     metoprolol (LOPRESSOR) 100 MG tablet Take 1 tablet (100 mg total) by mouth 2 (two) times daily.      Niacin CR 1000 MG TBCR Take by mouth.     nitroGLYCERIN (NITROSTAT) 0.4 MG SL tablet Place 1 tablet under the tongue every 5 (five) minutes x 2 doses as needed.     oxyCODONE-acetaminophen (PERCOCET) 10-325 MG tablet Take 1 tablet by mouth every 6 (six) hours as needed for pain.     pantoprazole (PROTONIX) 40 MG tablet Take 40 mg by mouth daily.     potassium chloride SA (K-DUR,KLOR-CON) 20 MEQ tablet Take 20 mEq by mouth daily.     rOPINIRole (REQUIP) 2 MG tablet Take by mouth.     rosuvastatin (CRESTOR) 40 MG tablet Take by mouth.     Semaglutide,0.25 or 0.5MG /DOS, (OZEMPIC, 0.25 OR 0.5 MG/DOSE,) 2 MG/1.5ML SOPN Inject into the skin.     testosterone cypionate (DEPOTESTOTERONE CYPIONATE) 100 MG/ML injection SMARTSIG:Milliliter(s) IM     torsemide (DEMADEX) 20 MG tablet Take 40 mg by mouth daily.     No current facility-administered medications on file prior to visit.    Allergies  Allergen Reactions   Codeine Swelling    Objective:  There were no vitals filed for this visit.  General: Well developed, nourished, in no acute distress, alert and oriented x3   Dermatology: Skin is warm, dry and supple bilateral.  Right hallux nail appears to be severely thickened with rams horn's apparent and incurvated with hyperkeratosis formation at the distal aspects of the medial and lateral nail borders with significant dried blood. (+) Erythema. (+) Edema. (-) serosanguous drainage present. The remaining nails appear short and thick and mycotic. There are no open sores, lesions or other signs of infection  present.  Vascular: Dorsalis Pedis artery and Posterior Tibial artery pedal pulses are 1/4 bilateral with immedate capillary fill time.  Scant pedal hair growth present. No lower extremity edema.   Neruologic: Grossly intact via light touch bilateral.  Musculoskeletal: Tenderness to palpation of the right hallux despite of severe neuropathy.  Muscular strength within normal limits  in all groups bilateral.   Assesement and Plan: Problem List Items Addressed This Visit      Endocrine   Type 2 diabetes mellitus (HCC)   Relevant Medications   insulin NPH Human (NOVOLIN N) 100 UNIT/ML injection   lisinopril (ZESTRIL) 10 MG tablet   metFORMIN (GLUCOPHAGE) 500 MG tablet   ramipril (ALTACE) 2.5 MG capsule   rosuvastatin (CRESTOR) 40 MG tablet   Semaglutide,0.25 or 0.5MG /DOS, (OZEMPIC, 0.25 OR 0.5 MG/DOSE,) 2 MG/1.5ML SOPN    Other Visit Diagnoses    Onycholysis of  toenail    -  Primary   Toe pain, bilateral       Current use of long term anticoagulation          -Discussed treatment alternatives and plan of care; Explained permanent/temporary nail avulsion and post procedure course to patient. Patient elects for permanent removal of right hallux nail - After a verbal and written consent, injected 3 ml of a 50:50 mixture of 2% plain  lidocaine and 0.5% plain marcaine in a normal hallux block fashion. Next, a  betadine prep was performed. Anesthesia was tested and found to be appropriate.  The right hallux nail completely was then incised from the hyponychium to the epinychium. The offending nail border was removed and cleared from the field. The area was curretted for any remaining nail or spicules. Phenol application performed and the area was then flushed with alcohol and dressed with antibiotic cream and a dry sterile dressing. -Patient was instructed to leave the dressing intact for today and begin soaking  in a weak solution of betadine or Epsom salt and water tomorrow. Patient was instructed to  soak for 15-20 minutes each day and apply neosporin/corticosporin and a gauze or bandaid dressing each day. -Prescribe Augmentin for preventative measures due to history of diabetes after nail procedure to take as instructed -Patient was instructed to monitor the toe for signs of infection and return to office if toe becomes red, hot or swollen. -Advised ice, elevation,  and tylenol if needed for pain.  -Patient is to return in 2 weeks for follow up care/nail check or sooner if problems arise.  Asencion Islam, DPM

## 2020-02-07 NOTE — Patient Instructions (Signed)

## 2020-02-16 DIAGNOSIS — Z79899 Other long term (current) drug therapy: Secondary | ICD-10-CM | POA: Diagnosis not present

## 2020-02-16 DIAGNOSIS — R7989 Other specified abnormal findings of blood chemistry: Secondary | ICD-10-CM | POA: Diagnosis not present

## 2020-02-16 DIAGNOSIS — G2581 Restless legs syndrome: Secondary | ICD-10-CM | POA: Diagnosis not present

## 2020-02-16 DIAGNOSIS — G894 Chronic pain syndrome: Secondary | ICD-10-CM | POA: Diagnosis not present

## 2020-02-16 DIAGNOSIS — Z5181 Encounter for therapeutic drug level monitoring: Secondary | ICD-10-CM | POA: Diagnosis not present

## 2020-02-16 DIAGNOSIS — R197 Diarrhea, unspecified: Secondary | ICD-10-CM | POA: Diagnosis not present

## 2020-02-24 ENCOUNTER — Ambulatory Visit: Payer: Medicare Other | Admitting: Sports Medicine

## 2020-02-24 ENCOUNTER — Encounter: Payer: Self-pay | Admitting: Sports Medicine

## 2020-02-24 ENCOUNTER — Other Ambulatory Visit: Payer: Self-pay

## 2020-02-24 DIAGNOSIS — M79674 Pain in right toe(s): Secondary | ICD-10-CM

## 2020-02-24 DIAGNOSIS — E1100 Type 2 diabetes mellitus with hyperosmolarity without nonketotic hyperglycemic-hyperosmolar coma (NKHHC): Secondary | ICD-10-CM

## 2020-02-24 DIAGNOSIS — Z9889 Other specified postprocedural states: Secondary | ICD-10-CM

## 2020-02-24 NOTE — Progress Notes (Signed)
Subjective: Benjamin Bush is a 65 y.o. male patient returns to office today for follow up evaluation after having right hallux total nail removal performed on 02/07/2020.  Patient has been soaking using epsom salt and applying topical antibiotic covered with bandaid daily.  Admits a little bit of soreness and a little bit of drainage but otherwise is doing okay.  Patient deniesfever/chills/nausea/vomitting/any other related constitutional symptoms at this time.  Patient Active Problem List   Diagnosis Date Noted  . Neurocognitive disorder 03/16/2019  . Suicidal ideation 03/16/2019  . Recurrent falls 01/19/2019  . CKD (chronic kidney disease) stage 3, GFR 30-59 ml/min (HCC) 01/01/2017  . History of coronary artery stent placement 01/01/2017  . Hx of percutaneous transcatheter closure of congenital ASD 01/01/2017  . CAD (coronary artery disease) 11/22/2016  . Chronic diastolic CHF (congestive heart failure) (HCC) 11/22/2016  . Obstructive sleep apnea 11/22/2016  . Type 2 diabetes mellitus (HCC) 11/22/2016  . Pulmonary embolism (HCC) 11/22/2016  . Obesity hypoventilation syndrome (HCC)   . Depression 02/18/2016  . Morbid obesity (HCC) 06/08/2015  . Preoperative cardiovascular examination 12/13/2014  . Hyperlipidemia LDL goal <100 11/12/2014  . ASD secundum 05/13/2013  . COPD (chronic obstructive pulmonary disease) (HCC) 05/13/2013  . Atrial septal defect 04/08/2013  . Unstable angina (HCC) 02/24/2013  . Acute MI (HCC) 07/02/2012  . Dyslipidemia 04/10/2012  . GERD (gastroesophageal reflux disease) 04/10/2012  . HTN (hypertension) 04/10/2012    Current Outpatient Medications on File Prior to Visit  Medication Sig Dispense Refill  . acetaminophen (TYLENOL) 500 MG tablet Take by mouth.    Marland Kitchen amitriptyline (ELAVIL) 25 MG tablet     . amLODipine (NORVASC) 10 MG tablet Take by mouth.    Marland Kitchen amoxicillin-clavulanate (AUGMENTIN) 875-125 MG tablet Take 1 tablet by mouth 2 (two) times daily. 28  tablet 0  . aspirin EC 81 MG tablet Take 81 mg by mouth daily.    Marland Kitchen atorvastatin (LIPITOR) 80 MG tablet Take 1 tablet (80 mg total) by mouth daily. 30 tablet 0  . buPROPion (WELLBUTRIN) 100 MG tablet Take by mouth.    . Carboxymethylcellulose Sodium 0.25 % SOLN Administer 2 drops to both eyes 4 (four) times a day as needed.    . cetirizine (ZYRTEC) 10 MG tablet Take by mouth.    . clopidogrel (PLAVIX) 75 MG tablet Take 75 mg by mouth daily with breakfast.    . donepezil (ARICEPT) 10 MG tablet Take by mouth.    . DULoxetine (CYMBALTA) 30 MG capsule Take by mouth.    . DULoxetine (CYMBALTA) 60 MG capsule Take 60 mg by mouth daily.    . Evolocumab (REPATHA SURECLICK) 140 MG/ML SOAJ Inject into the skin.    . famotidine (PEPCID) 40 MG tablet Take by mouth.    . fexofenadine (ALLEGRA) 180 MG tablet Take by mouth.    . folic acid (FOLVITE) 1 MG tablet Take by mouth.    Marland Kitchen HYDROcodone-acetaminophen (NORCO) 10-325 MG tablet 4 (four) times daily.    . hydrOXYzine (ATARAX/VISTARIL) 25 MG tablet Take by mouth.    . insulin NPH Human (NOVOLIN N) 100 UNIT/ML injection Inject into the skin.    . Insulin Syringe-Needle U-100 (BD VEO INSULIN SYRINGE U/F) 31G X 15/64" 0.5 ML MISC     . ipratropium (ATROVENT) 0.03 % nasal spray 1-2 sprays per nostril up to 4 times daily as needed for runny nose or post nasal drip    . ipratropium-albuterol (DUONEB) 0.5-2.5 (3) MG/3ML SOLN Inhale into  the lungs.    . isosorbide mononitrate (IMDUR) 60 MG 24 hr tablet Take 60 mg by mouth daily.    Marland Kitchen lisinopril (ZESTRIL) 10 MG tablet Take by mouth.    . magnesium oxide (MAG-OX) 400 MG tablet Take by mouth.    . metoprolol (LOPRESSOR) 100 MG tablet Take 1 tablet (100 mg total) by mouth 2 (two) times daily.    . Niacin CR 1000 MG TBCR Take by mouth.    . nitroGLYCERIN (NITROSTAT) 0.4 MG SL tablet Place 1 tablet under the tongue every 5 (five) minutes x 2 doses as needed.    Marland Kitchen oxyCODONE-acetaminophen (PERCOCET) 10-325 MG tablet Take  1 tablet by mouth every 6 (six) hours as needed for pain.    . pantoprazole (PROTONIX) 40 MG tablet Take 40 mg by mouth daily.    Marland Kitchen PARoxetine (PAXIL) 20 MG tablet Take 1 tablet by mouth daily.    . ramipril (ALTACE) 2.5 MG capsule Take by mouth.    . ranolazine (RANEXA) 1000 MG SR tablet Take by mouth.    Marland Kitchen rOPINIRole (REQUIP) 2 MG tablet Take by mouth.    . rosuvastatin (CRESTOR) 40 MG tablet Take by mouth.    . testosterone cypionate (DEPOTESTOTERONE CYPIONATE) 100 MG/ML injection SMARTSIG:Milliliter(s) IM    . thiamine 100 MG tablet Take by mouth.    Marland Kitchen tiZANidine (ZANAFLEX) 4 MG tablet Instructions 1/2-1 tablet up to every 8 hours as needed for muscle spasm    . torsemide (DEMADEX) 20 MG tablet Take 40 mg by mouth daily.     No current facility-administered medications on file prior to visit.    Allergies  Allergen Reactions  . Codeine Swelling    Objective:  General: Well developed, nourished, in no acute distress, alert and oriented x3   Dermatology: Skin is warm, dry and supple bilateral.  Right hallux nail bed appears to be clean, dry, with mild granular tissue and surrounding eschar/scab.  Minimal erythema. (-) Edema. (-) serosanguous drainage present. The remaining nails appear unremarkable at this time. There are no other lesions or other signs of infection present.  Neurovascular status: Unchanged from prior.  Musculoskeletal: Decreased tenderness to palpation of the right hallux nail bed. Muscular strength within normal limits bilateral.   Assesement and Plan: Problem List Items Addressed This Visit      Endocrine   Type 2 diabetes mellitus (HCC)    Other Visit Diagnoses    Status post nail surgery    -  Primary   Toe pain, right          -Examined patient  -Cleansed right hallux nail bed and gently scrubbed with peroxide and q-tip/curetted away eschar at site and applied antibiotic cream covered with bandaid.  -Discussed plan of care with patient. -Patient to  now begin soaking in a weak solution of Epsom salt and warm water. Patient was instructed to soak for 15-20 minutes each day until the toe appears normal and there is no drainage, redness, tenderness, or swelling at the procedure site, and apply neosporin and a gauze or bandaid dressing each day as needed. May leave open to air at night. -Educated patient on long term care after nail surgery. -Patient was instructed to monitor the toe for reoccurrence and signs of infection; Patient advised to return to office or go to ER if toe becomes red, hot or swollen. -Patient is to return as needed or sooner if problems arise.  Asencion Islam, DPM

## 2020-03-01 DIAGNOSIS — J9612 Chronic respiratory failure with hypercapnia: Secondary | ICD-10-CM | POA: Diagnosis not present

## 2020-03-06 DIAGNOSIS — J069 Acute upper respiratory infection, unspecified: Secondary | ICD-10-CM | POA: Diagnosis not present

## 2020-03-06 DIAGNOSIS — R059 Cough, unspecified: Secondary | ICD-10-CM | POA: Diagnosis not present

## 2020-03-12 ENCOUNTER — Other Ambulatory Visit: Payer: Self-pay | Admitting: Sports Medicine

## 2020-03-12 ENCOUNTER — Telehealth: Payer: Self-pay | Admitting: Sports Medicine

## 2020-03-12 MED ORDER — SULFAMETHOXAZOLE-TRIMETHOPRIM 400-80 MG PO TABS: 1.0000 | ORAL_TABLET | Freq: Two times a day (BID) | ORAL | 0 refills | Status: DC

## 2020-03-12 NOTE — Telephone Encounter (Signed)
Rx sent for Bactrim antibiotic to [facility] [place] for redness, drainage, and swelling at toenail procedure site Have the patient change to soaking with 2 table spoons of betadine to basin of warm water 20 mins once a day, cover toe during the day with bandaid and leave toe open to air at night. Do NOT apply onitiment or peroxide to the toe. If no better after 1 week of taking antibiotic and soaking with betadine, call back for in person or virtual appointment. Thanks Dr. Marylene Land

## 2020-03-12 NOTE — Progress Notes (Signed)
Rx sent for Bactrim antibiotic to [facility] [place] for redness, drainage, and swelling at toenail procedure site Have the patient change to soaking with 2 table spoons of betadine to basin of warm water 20 mins once a day, cover toe during the day with bandaid and leave toe open to air at night. Do NOT apply onitiment or peroxide to the toe. If no better after 1 week of taking antibiotic and soaking with betadine, call back for in person or virtual appointment. Thanks Dr. Kota Ciancio 

## 2020-03-12 NOTE — Telephone Encounter (Signed)
Pt had nail removal 02-07-20.  States it is red with drainage, swelling and has never formed scab.  Do you need to see him or call in antibitoics.  [facility]

## 2020-03-13 NOTE — Telephone Encounter (Signed)
Pt notified and understood with instructions/reb

## 2020-03-20 DIAGNOSIS — U071 COVID-19: Secondary | ICD-10-CM | POA: Diagnosis not present

## 2020-03-29 MED FILL — REPATHA SURECLICK 140 MG/ML SUBCUTANEOUS PEN INJECTOR: SUBCUTANEOUS | 56 days supply | Qty: 4 | Fill #2

## 2020-04-01 DIAGNOSIS — J9612 Chronic respiratory failure with hypercapnia: Secondary | ICD-10-CM | POA: Diagnosis not present

## 2020-04-20 DIAGNOSIS — E1140 Type 2 diabetes mellitus with diabetic neuropathy, unspecified: Secondary | ICD-10-CM | POA: Diagnosis not present

## 2020-04-20 DIAGNOSIS — K219 Gastro-esophageal reflux disease without esophagitis: Secondary | ICD-10-CM | POA: Diagnosis not present

## 2020-04-20 DIAGNOSIS — E1169 Type 2 diabetes mellitus with other specified complication: Secondary | ICD-10-CM | POA: Diagnosis not present

## 2020-04-20 DIAGNOSIS — E782 Mixed hyperlipidemia: Secondary | ICD-10-CM | POA: Diagnosis not present

## 2020-04-20 DIAGNOSIS — G894 Chronic pain syndrome: Secondary | ICD-10-CM | POA: Diagnosis not present

## 2020-04-20 DIAGNOSIS — Z794 Long term (current) use of insulin: Secondary | ICD-10-CM | POA: Diagnosis not present

## 2020-04-20 DIAGNOSIS — N1831 Chronic kidney disease, stage 3a: Secondary | ICD-10-CM | POA: Diagnosis not present

## 2020-04-20 DIAGNOSIS — E1142 Type 2 diabetes mellitus with diabetic polyneuropathy: Secondary | ICD-10-CM | POA: Diagnosis not present

## 2020-04-20 DIAGNOSIS — E1122 Type 2 diabetes mellitus with diabetic chronic kidney disease: Secondary | ICD-10-CM | POA: Diagnosis not present

## 2020-04-25 DIAGNOSIS — J9612 Chronic respiratory failure with hypercapnia: Secondary | ICD-10-CM | POA: Diagnosis not present

## 2020-05-18 ENCOUNTER — Ambulatory Visit: Admit: 2020-05-18 | Discharge: 2020-05-19 | Payer: MEDICARE

## 2020-05-18 DIAGNOSIS — N1832 Stage 3b chronic kidney disease (CMS-HCC): Principal | ICD-10-CM

## 2020-05-21 DIAGNOSIS — G894 Chronic pain syndrome: Secondary | ICD-10-CM | POA: Diagnosis not present

## 2020-05-21 DIAGNOSIS — I10 Essential (primary) hypertension: Secondary | ICD-10-CM | POA: Diagnosis not present

## 2020-05-21 DIAGNOSIS — Z5181 Encounter for therapeutic drug level monitoring: Secondary | ICD-10-CM | POA: Diagnosis not present

## 2020-05-21 DIAGNOSIS — Z79899 Other long term (current) drug therapy: Secondary | ICD-10-CM | POA: Diagnosis not present

## 2020-05-21 DIAGNOSIS — M792 Neuralgia and neuritis, unspecified: Secondary | ICD-10-CM | POA: Diagnosis not present

## 2020-05-21 DIAGNOSIS — M79675 Pain in left toe(s): Secondary | ICD-10-CM | POA: Diagnosis not present

## 2020-05-22 ENCOUNTER — Ambulatory Visit: Admit: 2020-05-22 | Discharge: 2020-05-23 | Payer: MEDICARE | Attending: Internal Medicine | Primary: Internal Medicine

## 2020-05-22 DIAGNOSIS — E1122 Type 2 diabetes mellitus with diabetic chronic kidney disease: Principal | ICD-10-CM

## 2020-05-22 DIAGNOSIS — N183 Stage 3 chronic kidney disease, unspecified whether stage 3a or 3b CKD (CMS-HCC): Principal | ICD-10-CM

## 2020-05-22 DIAGNOSIS — E1169 Type 2 diabetes mellitus with other specified complication: Principal | ICD-10-CM

## 2020-05-22 DIAGNOSIS — G4733 Obstructive sleep apnea (adult) (pediatric): Principal | ICD-10-CM

## 2020-05-22 DIAGNOSIS — I2510 Atherosclerotic heart disease of native coronary artery without angina pectoris: Principal | ICD-10-CM

## 2020-05-22 DIAGNOSIS — I129 Hypertensive chronic kidney disease with stage 1 through stage 4 chronic kidney disease, or unspecified chronic kidney disease: Principal | ICD-10-CM

## 2020-05-22 DIAGNOSIS — I10 Essential (primary) hypertension: Principal | ICD-10-CM

## 2020-05-22 DIAGNOSIS — E785 Hyperlipidemia, unspecified: Principal | ICD-10-CM

## 2020-05-22 MED FILL — REPATHA SURECLICK 140 MG/ML SUBCUTANEOUS PEN INJECTOR: SUBCUTANEOUS | 56 days supply | Qty: 4 | Fill #3

## 2020-05-26 DIAGNOSIS — J9612 Chronic respiratory failure with hypercapnia: Secondary | ICD-10-CM | POA: Diagnosis not present

## 2020-05-29 DIAGNOSIS — N183 Stage 3 chronic kidney disease, unspecified whether stage 3a or 3b CKD (CMS-HCC): Principal | ICD-10-CM

## 2020-06-18 DIAGNOSIS — Z79899 Other long term (current) drug therapy: Secondary | ICD-10-CM | POA: Diagnosis not present

## 2020-06-18 DIAGNOSIS — Z6841 Body Mass Index (BMI) 40.0 and over, adult: Secondary | ICD-10-CM | POA: Diagnosis not present

## 2020-06-18 DIAGNOSIS — G894 Chronic pain syndrome: Secondary | ICD-10-CM | POA: Diagnosis not present

## 2020-06-18 DIAGNOSIS — I129 Hypertensive chronic kidney disease with stage 1 through stage 4 chronic kidney disease, or unspecified chronic kidney disease: Secondary | ICD-10-CM | POA: Diagnosis not present

## 2020-06-18 DIAGNOSIS — Z5181 Encounter for therapeutic drug level monitoring: Secondary | ICD-10-CM | POA: Diagnosis not present

## 2020-06-18 DIAGNOSIS — I10 Essential (primary) hypertension: Secondary | ICD-10-CM | POA: Diagnosis not present

## 2020-06-25 DIAGNOSIS — J9612 Chronic respiratory failure with hypercapnia: Secondary | ICD-10-CM | POA: Diagnosis not present

## 2020-07-25 MED FILL — REPATHA SURECLICK 140 MG/ML SUBCUTANEOUS PEN INJECTOR: SUBCUTANEOUS | 56 days supply | Qty: 4 | Fill #4

## 2020-07-26 DIAGNOSIS — J9612 Chronic respiratory failure with hypercapnia: Secondary | ICD-10-CM | POA: Diagnosis not present

## 2020-08-17 DIAGNOSIS — E1140 Type 2 diabetes mellitus with diabetic neuropathy, unspecified: Secondary | ICD-10-CM | POA: Diagnosis not present

## 2020-08-17 DIAGNOSIS — E1169 Type 2 diabetes mellitus with other specified complication: Secondary | ICD-10-CM | POA: Diagnosis not present

## 2020-08-17 DIAGNOSIS — G894 Chronic pain syndrome: Secondary | ICD-10-CM | POA: Diagnosis not present

## 2020-08-17 DIAGNOSIS — I129 Hypertensive chronic kidney disease with stage 1 through stage 4 chronic kidney disease, or unspecified chronic kidney disease: Secondary | ICD-10-CM | POA: Diagnosis not present

## 2020-08-17 DIAGNOSIS — E1122 Type 2 diabetes mellitus with diabetic chronic kidney disease: Secondary | ICD-10-CM | POA: Diagnosis not present

## 2020-08-17 DIAGNOSIS — Z79899 Other long term (current) drug therapy: Secondary | ICD-10-CM | POA: Diagnosis not present

## 2020-08-17 DIAGNOSIS — E782 Mixed hyperlipidemia: Secondary | ICD-10-CM | POA: Diagnosis not present

## 2020-08-17 DIAGNOSIS — N1831 Chronic kidney disease, stage 3a: Secondary | ICD-10-CM | POA: Diagnosis not present

## 2020-08-17 DIAGNOSIS — Z5181 Encounter for therapeutic drug level monitoring: Secondary | ICD-10-CM | POA: Diagnosis not present

## 2020-08-25 DIAGNOSIS — J9612 Chronic respiratory failure with hypercapnia: Secondary | ICD-10-CM | POA: Diagnosis not present

## 2020-09-25 DIAGNOSIS — J9612 Chronic respiratory failure with hypercapnia: Secondary | ICD-10-CM | POA: Diagnosis not present

## 2020-09-27 DIAGNOSIS — E785 Hyperlipidemia, unspecified: Principal | ICD-10-CM

## 2020-09-27 DIAGNOSIS — I2510 Atherosclerotic heart disease of native coronary artery without angina pectoris: Principal | ICD-10-CM

## 2020-10-18 DIAGNOSIS — N189 Chronic kidney disease, unspecified: Secondary | ICD-10-CM | POA: Diagnosis not present

## 2020-10-18 DIAGNOSIS — E1140 Type 2 diabetes mellitus with diabetic neuropathy, unspecified: Secondary | ICD-10-CM | POA: Diagnosis not present

## 2020-10-18 DIAGNOSIS — N400 Enlarged prostate without lower urinary tract symptoms: Secondary | ICD-10-CM | POA: Diagnosis not present

## 2020-10-18 DIAGNOSIS — E785 Hyperlipidemia, unspecified: Secondary | ICD-10-CM | POA: Diagnosis not present

## 2020-10-18 DIAGNOSIS — Z6841 Body Mass Index (BMI) 40.0 and over, adult: Secondary | ICD-10-CM | POA: Diagnosis not present

## 2020-10-18 DIAGNOSIS — I2510 Atherosclerotic heart disease of native coronary artery without angina pectoris: Secondary | ICD-10-CM | POA: Diagnosis not present

## 2020-10-18 DIAGNOSIS — Z79899 Other long term (current) drug therapy: Secondary | ICD-10-CM | POA: Diagnosis not present

## 2020-10-18 DIAGNOSIS — Z5181 Encounter for therapeutic drug level monitoring: Secondary | ICD-10-CM | POA: Diagnosis not present

## 2020-10-18 DIAGNOSIS — Z1331 Encounter for screening for depression: Secondary | ICD-10-CM | POA: Diagnosis not present

## 2020-10-18 DIAGNOSIS — Z0000 Encounter for general adult medical examination without abnormal findings: Secondary | ICD-10-CM | POA: Diagnosis not present

## 2020-10-25 ENCOUNTER — Ambulatory Visit: Admit: 2020-10-25 | Discharge: 2020-10-26 | Payer: MEDICARE

## 2020-10-25 DIAGNOSIS — M545 Low back pain, unspecified: Secondary | ICD-10-CM | POA: Diagnosis not present

## 2020-10-25 DIAGNOSIS — I509 Heart failure, unspecified: Secondary | ICD-10-CM | POA: Diagnosis not present

## 2020-10-25 DIAGNOSIS — F32A Depression, unspecified: Secondary | ICD-10-CM | POA: Diagnosis not present

## 2020-10-25 DIAGNOSIS — M1611 Unilateral primary osteoarthritis, right hip: Secondary | ICD-10-CM | POA: Diagnosis not present

## 2020-10-25 DIAGNOSIS — G8929 Other chronic pain: Secondary | ICD-10-CM | POA: Diagnosis not present

## 2020-10-25 DIAGNOSIS — F17220 Nicotine dependence, chewing tobacco, uncomplicated: Secondary | ICD-10-CM | POA: Diagnosis not present

## 2020-10-25 DIAGNOSIS — M1612 Unilateral primary osteoarthritis, left hip: Secondary | ICD-10-CM | POA: Diagnosis not present

## 2020-10-25 DIAGNOSIS — F0390 Unspecified dementia without behavioral disturbance: Secondary | ICD-10-CM | POA: Diagnosis not present

## 2020-10-25 DIAGNOSIS — M25551 Pain in right hip: Secondary | ICD-10-CM | POA: Diagnosis not present

## 2020-10-25 DIAGNOSIS — M5136 Other intervertebral disc degeneration, lumbar region: Secondary | ICD-10-CM | POA: Diagnosis not present

## 2020-10-25 DIAGNOSIS — I130 Hypertensive heart and chronic kidney disease with heart failure and stage 1 through stage 4 chronic kidney disease, or unspecified chronic kidney disease: Secondary | ICD-10-CM | POA: Diagnosis not present

## 2020-10-25 DIAGNOSIS — S81812A Laceration without foreign body, left lower leg, initial encounter: Secondary | ICD-10-CM | POA: Diagnosis not present

## 2020-10-25 DIAGNOSIS — I2510 Atherosclerotic heart disease of native coronary artery without angina pectoris: Secondary | ICD-10-CM | POA: Diagnosis not present

## 2020-10-26 DIAGNOSIS — J9612 Chronic respiratory failure with hypercapnia: Secondary | ICD-10-CM | POA: Diagnosis not present

## 2020-10-31 ENCOUNTER — Ambulatory Visit: Payer: Medicare HMO | Admitting: Sports Medicine

## 2020-10-31 ENCOUNTER — Encounter: Payer: Self-pay | Admitting: Sports Medicine

## 2020-10-31 ENCOUNTER — Other Ambulatory Visit: Payer: Self-pay

## 2020-10-31 DIAGNOSIS — M79674 Pain in right toe(s): Secondary | ICD-10-CM | POA: Diagnosis not present

## 2020-10-31 DIAGNOSIS — E1100 Type 2 diabetes mellitus with hyperosmolarity without nonketotic hyperglycemic-hyperosmolar coma (NKHHC): Secondary | ICD-10-CM

## 2020-10-31 DIAGNOSIS — B351 Tinea unguium: Secondary | ICD-10-CM | POA: Diagnosis not present

## 2020-10-31 DIAGNOSIS — M79675 Pain in left toe(s): Secondary | ICD-10-CM

## 2020-10-31 DIAGNOSIS — J449 Chronic obstructive pulmonary disease, unspecified: Secondary | ICD-10-CM | POA: Diagnosis not present

## 2020-10-31 DIAGNOSIS — Z7901 Long term (current) use of anticoagulants: Secondary | ICD-10-CM

## 2020-10-31 NOTE — Progress Notes (Signed)
Subjective: Benjamin Bush is a 65 y.o. male patient with history of diabetes who presents to office today complaining of long,mildly painful nails  while ambulating in shoes; unable to trim.  Reports that his left great toenail is very thick and getting dark from fungus over the last 2 months unable to trim.  Patient states that the glucose reading this morning was 165 mg/dl.  A1c 9.9.  Patient denies any new changes in medication or new problems.  Reports that he is having issues with his back and possibly will need back surgery and also knee surgery upcoming.  Patient Active Problem List   Diagnosis Date Noted   Neurocognitive disorder 03/16/2019   Suicidal ideation 03/16/2019   Recurrent falls 01/19/2019   CKD (chronic kidney disease) stage 3, GFR 30-59 ml/min (HCC) 01/01/2017   History of coronary artery stent placement 01/01/2017   Hx of percutaneous transcatheter closure of congenital ASD 01/01/2017   CAD (coronary artery disease) 11/22/2016   Chronic diastolic CHF (congestive heart failure) (HCC) 11/22/2016   Obstructive sleep apnea 11/22/2016   Type 2 diabetes mellitus (HCC) 11/22/2016   Pulmonary embolism (HCC) 11/22/2016   Obesity hypoventilation syndrome (HCC)    Depression 02/18/2016   Morbid obesity (HCC) 06/08/2015   Preoperative cardiovascular examination 12/13/2014   Hyperlipidemia LDL goal <100 11/12/2014   ASD secundum 05/13/2013   COPD (chronic obstructive pulmonary disease) (HCC) 05/13/2013   Atrial septal defect 04/08/2013   Unstable angina (HCC) 02/24/2013   Acute MI (HCC) 07/02/2012   Dyslipidemia 04/10/2012   GERD (gastroesophageal reflux disease) 04/10/2012   HTN (hypertension) 04/10/2012   Current Outpatient Medications on File Prior to Visit  Medication Sig Dispense Refill   amLODipine (NORVASC) 10 MG tablet Take 1 tablet by mouth 2 (two) times daily.     atorvastatin (LIPITOR) 80 MG tablet Take 1 tablet by mouth daily.     clopidogrel (PLAVIX) 75 MG  tablet Take 1 tablet by mouth daily.     acetaminophen (TYLENOL) 500 MG tablet Take by mouth.     amitriptyline (ELAVIL) 25 MG tablet      amLODipine (NORVASC) 10 MG tablet Take by mouth.     amoxicillin-clavulanate (AUGMENTIN) 875-125 MG tablet Take 1 tablet by mouth 2 (two) times daily. 28 tablet 0   aspirin EC 81 MG tablet Take 81 mg by mouth daily.     atorvastatin (LIPITOR) 80 MG tablet Take 1 tablet (80 mg total) by mouth daily. 30 tablet 0   buPROPion (WELLBUTRIN) 100 MG tablet Take by mouth.     buPROPion (WELLBUTRIN) 100 MG tablet Take by mouth.     Carboxymethylcellulose Sodium 0.25 % SOLN Administer 2 drops to both eyes 4 (four) times a day as needed.     cephALEXin (KEFLEX) 500 MG capsule Take 500 mg by mouth 2 (two) times daily.     cetirizine (ZYRTEC) 10 MG tablet Take by mouth.     clopidogrel (PLAVIX) 75 MG tablet Take 75 mg by mouth daily with breakfast.     donepezil (ARICEPT) 10 MG tablet Take by mouth.     DULoxetine (CYMBALTA) 30 MG capsule Take by mouth.     DULoxetine (CYMBALTA) 60 MG capsule Take 60 mg by mouth daily.     Evolocumab (REPATHA SURECLICK) 140 MG/ML SOAJ Inject into the skin.     ezetimibe (ZETIA) 10 MG tablet Take 10 mg by mouth daily.     famotidine (PEPCID) 40 MG tablet Take by mouth.  fexofenadine (ALLEGRA) 180 MG tablet Take by mouth.     HYDROcodone-acetaminophen (NORCO) 10-325 MG tablet 4 (four) times daily.     hydrOXYzine (ATARAX/VISTARIL) 25 MG tablet Take by mouth.     insulin NPH Human (NOVOLIN N) 100 UNIT/ML injection Inject into the skin.     Insulin Syringe-Needle U-100 (BD VEO INSULIN SYRINGE U/F) 31G X 15/64" 0.5 ML MISC      ipratropium (ATROVENT) 0.03 % nasal spray 1-2 sprays per nostril up to 4 times daily as needed for runny nose or post nasal drip     ipratropium-albuterol (DUONEB) 0.5-2.5 (3) MG/3ML SOLN Inhale into the lungs.     isosorbide mononitrate (IMDUR) 60 MG 24 hr tablet Take 60 mg by mouth daily.     lisinopril  (ZESTRIL) 10 MG tablet Take by mouth.     lisinopril (ZESTRIL) 10 MG tablet Take by mouth.     magnesium oxide (MAG-OX) 400 MG tablet Take by mouth.     methylPREDNISolone (MEDROL DOSEPAK) 4 MG TBPK tablet See admin instructions. follow package directions     metoprolol (LOPRESSOR) 100 MG tablet Take 1 tablet (100 mg total) by mouth 2 (two) times daily.     Niacin CR 1000 MG TBCR Take by mouth.     nitroGLYCERIN (NITROSTAT) 0.4 MG SL tablet Place 1 tablet under the tongue every 5 (five) minutes x 2 doses as needed.     oxyCODONE-acetaminophen (PERCOCET) 10-325 MG tablet Take 1 tablet by mouth every 6 (six) hours as needed for pain.     pantoprazole (PROTONIX) 40 MG tablet Take 40 mg by mouth daily.     PARoxetine (PAXIL) 20 MG tablet Take 1 tablet by mouth daily.     QUEtiapine (SEROQUEL) 50 MG tablet Take 50 mg by mouth at bedtime.     ramipril (ALTACE) 2.5 MG capsule Take by mouth.     ranolazine (RANEXA) 1000 MG SR tablet Take by mouth.     rOPINIRole (REQUIP) 2 MG tablet Take by mouth.     rosuvastatin (CRESTOR) 40 MG tablet Take by mouth.     sulfamethoxazole-trimethoprim (BACTRIM) 400-80 MG tablet Take 1 tablet by mouth 2 (two) times daily. 28 tablet 0   testosterone cypionate (DEPOTESTOSTERONE CYPIONATE) 200 MG/ML injection Inject into the muscle.     testosterone cypionate (DEPOTESTOTERONE CYPIONATE) 100 MG/ML injection SMARTSIG:Milliliter(s) IM     tiZANidine (ZANAFLEX) 4 MG tablet Instructions 1/2-1 tablet up to every 8 hours as needed for muscle spasm     torsemide (DEMADEX) 20 MG tablet Take 40 mg by mouth daily.     No current facility-administered medications on file prior to visit.   Allergies  Allergen Reactions   Codeine Swelling    No results found for this or any previous visit (from the past 2160 hour(s)).  Objective: General: Patient is awake, alert, and oriented x 3 and in no acute distress.  Integument: Skin is warm, dry and supple bilateral. Nails are tender,  long, thickened and  dystrophic with subungual debris, consistent with onychomycosis, 1-5 on left and 2 through 5 on right no signs of infection.  Previous right hallux nail procedure site well-healed.  No open lesions or preulcerative lesions present bilateral.  Recovering from ant bites bilateral lower legs no signs of infection currently dry and crusted over previously treated by PCP.  Remaining integument unremarkable.  Vasculature:  Dorsalis Pedis pulse 1/4 bilateral. Posterior Tibial pulse 1/4 bilateral.  Capillary fill time <3 sec 1-5 bilateral.  Scant  hair growth to the level of the digits. Temperature gradient within normal limits.  Moderate varicosities present bilateral. No edema present bilateral.   Neurology: Gross sensation present via light touch bilateral.  Musculoskeletal: Pes planus foot type no other symptomatic pedal deformities noted at this time.  Assessment and Plan: Problem List Items Addressed This Visit       Endocrine   Type 2 diabetes mellitus (HCC)   Relevant Medications   atorvastatin (LIPITOR) 80 MG tablet   lisinopril (ZESTRIL) 10 MG tablet   Other Visit Diagnoses     Pain due to onychomycosis of toenails of both feet    -  Primary   Relevant Medications   cephALEXin (KEFLEX) 500 MG capsule   Current use of long term anticoagulation           -Examined patient. -Discussed and educated patient on diabetic foot care, especially with  regards to the vascular, neurological and musculoskeletal systems.  -Stressed the importance of good glycemic control and the detriment of not  controlling glucose levels in relation to the foot. -Mechanically debrided all nails 1-5 on the left and 2 through 5 on right using sterile nail nipper and filed with dremel without incident  Advised patient that I do not recommend a nail procedure at this time until he works on getting his A1c back down because the left hallux nail is thick I think routine trimming's will be  very beneficial to keep it under control -Answered all patient questions -Patient to return  in 3 months for at risk foot care -Patient advised to call the office if any problems or questions arise in the meantime.  Asencion Islam, DPM

## 2020-11-25 DIAGNOSIS — J9612 Chronic respiratory failure with hypercapnia: Secondary | ICD-10-CM | POA: Diagnosis not present

## 2020-12-04 ENCOUNTER — Ambulatory Visit: Admit: 2020-12-04 | Discharge: 2020-12-05 | Payer: MEDICARE

## 2020-12-04 DIAGNOSIS — G8929 Other chronic pain: Principal | ICD-10-CM

## 2020-12-04 DIAGNOSIS — M5459 Lumbar facet joint pain: Principal | ICD-10-CM

## 2020-12-04 DIAGNOSIS — M545 Chronic right-sided low back pain without sciatica: Principal | ICD-10-CM

## 2020-12-04 DIAGNOSIS — M47816 Spondylosis without myelopathy or radiculopathy, lumbar region: Principal | ICD-10-CM

## 2020-12-17 DIAGNOSIS — E1169 Type 2 diabetes mellitus with other specified complication: Secondary | ICD-10-CM | POA: Diagnosis not present

## 2020-12-17 DIAGNOSIS — Z23 Encounter for immunization: Secondary | ICD-10-CM | POA: Diagnosis not present

## 2020-12-17 DIAGNOSIS — E1140 Type 2 diabetes mellitus with diabetic neuropathy, unspecified: Secondary | ICD-10-CM | POA: Diagnosis not present

## 2020-12-17 DIAGNOSIS — G894 Chronic pain syndrome: Secondary | ICD-10-CM | POA: Diagnosis not present

## 2020-12-17 DIAGNOSIS — Z6839 Body mass index (BMI) 39.0-39.9, adult: Secondary | ICD-10-CM | POA: Diagnosis not present

## 2020-12-17 DIAGNOSIS — G4733 Obstructive sleep apnea (adult) (pediatric): Secondary | ICD-10-CM | POA: Diagnosis not present

## 2020-12-19 DIAGNOSIS — G8929 Other chronic pain: Principal | ICD-10-CM

## 2020-12-26 DIAGNOSIS — J9612 Chronic respiratory failure with hypercapnia: Secondary | ICD-10-CM | POA: Diagnosis not present

## 2020-12-28 ENCOUNTER — Ambulatory Visit: Admit: 2020-12-28 | Discharge: 2020-12-29 | Payer: MEDICARE

## 2020-12-28 DIAGNOSIS — M47816 Spondylosis without myelopathy or radiculopathy, lumbar region: Secondary | ICD-10-CM | POA: Diagnosis not present

## 2020-12-28 DIAGNOSIS — Z66 Do not resuscitate: Secondary | ICD-10-CM | POA: Diagnosis not present

## 2020-12-28 DIAGNOSIS — M545 Low back pain, unspecified: Secondary | ICD-10-CM | POA: Diagnosis not present

## 2020-12-28 DIAGNOSIS — G8929 Other chronic pain: Secondary | ICD-10-CM | POA: Diagnosis not present

## 2020-12-28 DIAGNOSIS — M48061 Spinal stenosis, lumbar region without neurogenic claudication: Secondary | ICD-10-CM | POA: Diagnosis not present

## 2020-12-28 DIAGNOSIS — M2550 Pain in unspecified joint: Secondary | ICD-10-CM | POA: Diagnosis not present

## 2021-01-01 ENCOUNTER — Ambulatory Visit: Admit: 2021-01-01 | Discharge: 2021-01-02 | Payer: MEDICARE

## 2021-01-01 DIAGNOSIS — M47816 Spondylosis without myelopathy or radiculopathy, lumbar region: Principal | ICD-10-CM

## 2021-01-01 DIAGNOSIS — M5459 Lumbar facet joint pain: Principal | ICD-10-CM

## 2021-01-01 DIAGNOSIS — M48061 Spinal stenosis, lumbar region without neurogenic claudication: Principal | ICD-10-CM

## 2021-01-03 ENCOUNTER — Ambulatory Visit
Admit: 2021-01-03 | Payer: MEDICARE | Attending: Rehabilitative and Restorative Service Providers" | Primary: Rehabilitative and Restorative Service Providers"

## 2021-01-03 ENCOUNTER — Ambulatory Visit
Admit: 2021-01-03 | Discharge: 2021-02-01 | Payer: MEDICARE | Attending: Rehabilitative and Restorative Service Providers" | Primary: Rehabilitative and Restorative Service Providers"

## 2021-01-03 DIAGNOSIS — M5441 Lumbago with sciatica, right side: Secondary | ICD-10-CM | POA: Diagnosis not present

## 2021-01-03 DIAGNOSIS — G8929 Other chronic pain: Secondary | ICD-10-CM | POA: Diagnosis not present

## 2021-01-03 DIAGNOSIS — Z66 Do not resuscitate: Secondary | ICD-10-CM | POA: Diagnosis not present

## 2021-01-09 DIAGNOSIS — M545 Low back pain of over 3 months duration: Principal | ICD-10-CM

## 2021-01-22 ENCOUNTER — Ambulatory Visit: Admit: 2021-01-22 | Discharge: 2021-01-23 | Payer: MEDICARE | Attending: Urology | Primary: Urology

## 2021-01-22 DIAGNOSIS — N400 Enlarged prostate without lower urinary tract symptoms: Principal | ICD-10-CM

## 2021-01-22 DIAGNOSIS — N401 Enlarged prostate with lower urinary tract symptoms: Secondary | ICD-10-CM | POA: Diagnosis not present

## 2021-01-22 DIAGNOSIS — R339 Retention of urine, unspecified: Secondary | ICD-10-CM | POA: Diagnosis not present

## 2021-01-30 ENCOUNTER — Ambulatory Visit: Payer: Medicare HMO | Admitting: Sports Medicine

## 2021-01-31 DIAGNOSIS — E785 Hyperlipidemia, unspecified: Principal | ICD-10-CM

## 2021-01-31 DIAGNOSIS — I2510 Atherosclerotic heart disease of native coronary artery without angina pectoris: Principal | ICD-10-CM

## 2021-01-31 MED ORDER — REPATHA SURECLICK 140 MG/ML SUBCUTANEOUS PEN INJECTOR
SUBCUTANEOUS | 6 refills | 56.00000 days
Start: 2021-01-31 — End: ?

## 2021-02-04 DIAGNOSIS — I2510 Atherosclerotic heart disease of native coronary artery without angina pectoris: Principal | ICD-10-CM

## 2021-02-04 DIAGNOSIS — E785 Hyperlipidemia, unspecified: Principal | ICD-10-CM

## 2021-02-04 MED ORDER — REPATHA SURECLICK 140 MG/ML SUBCUTANEOUS PEN INJECTOR
SUBCUTANEOUS | 6 refills | 56 days | Status: CP
Start: 2021-02-04 — End: ?
  Filled 2021-02-13: qty 2, 28d supply, fill #0

## 2021-02-07 DIAGNOSIS — M792 Neuralgia and neuritis, unspecified: Secondary | ICD-10-CM | POA: Diagnosis not present

## 2021-02-07 DIAGNOSIS — G629 Polyneuropathy, unspecified: Secondary | ICD-10-CM | POA: Diagnosis not present

## 2021-02-07 DIAGNOSIS — M545 Low back pain, unspecified: Secondary | ICD-10-CM | POA: Diagnosis not present

## 2021-02-13 ENCOUNTER — Ambulatory Visit
Admit: 2021-02-13 | Payer: MEDICARE | Attending: Rehabilitative and Restorative Service Providers" | Primary: Rehabilitative and Restorative Service Providers"

## 2021-03-19 DIAGNOSIS — Z6841 Body Mass Index (BMI) 40.0 and over, adult: Secondary | ICD-10-CM | POA: Diagnosis not present

## 2021-03-19 DIAGNOSIS — G8929 Other chronic pain: Secondary | ICD-10-CM | POA: Diagnosis not present

## 2021-03-19 DIAGNOSIS — E1122 Type 2 diabetes mellitus with diabetic chronic kidney disease: Secondary | ICD-10-CM | POA: Diagnosis not present

## 2021-03-19 DIAGNOSIS — E782 Mixed hyperlipidemia: Secondary | ICD-10-CM | POA: Diagnosis not present

## 2021-03-19 DIAGNOSIS — E1169 Type 2 diabetes mellitus with other specified complication: Secondary | ICD-10-CM | POA: Diagnosis not present

## 2021-03-19 DIAGNOSIS — E559 Vitamin D deficiency, unspecified: Secondary | ICD-10-CM | POA: Diagnosis not present

## 2021-03-19 DIAGNOSIS — E1140 Type 2 diabetes mellitus with diabetic neuropathy, unspecified: Secondary | ICD-10-CM | POA: Diagnosis not present

## 2021-03-19 DIAGNOSIS — R7989 Other specified abnormal findings of blood chemistry: Secondary | ICD-10-CM | POA: Diagnosis not present

## 2021-03-25 DIAGNOSIS — M47816 Spondylosis without myelopathy or radiculopathy, lumbar region: Secondary | ICD-10-CM

## 2021-03-25 DIAGNOSIS — I10 Essential (primary) hypertension: Secondary | ICD-10-CM | POA: Diagnosis not present

## 2021-03-25 DIAGNOSIS — M1711 Unilateral primary osteoarthritis, right knee: Secondary | ICD-10-CM | POA: Diagnosis not present

## 2021-03-25 DIAGNOSIS — Z96652 Presence of left artificial knee joint: Secondary | ICD-10-CM | POA: Diagnosis not present

## 2021-03-25 DIAGNOSIS — M179 Osteoarthritis of knee, unspecified: Secondary | ICD-10-CM | POA: Insufficient documentation

## 2021-03-25 DIAGNOSIS — M545 Low back pain, unspecified: Secondary | ICD-10-CM | POA: Diagnosis not present

## 2021-03-25 DIAGNOSIS — M25561 Pain in right knee: Secondary | ICD-10-CM | POA: Diagnosis not present

## 2021-03-25 DIAGNOSIS — M25562 Pain in left knee: Secondary | ICD-10-CM | POA: Diagnosis not present

## 2021-03-25 DIAGNOSIS — Z6841 Body Mass Index (BMI) 40.0 and over, adult: Secondary | ICD-10-CM | POA: Diagnosis not present

## 2021-03-25 DIAGNOSIS — Z79891 Long term (current) use of opiate analgesic: Secondary | ICD-10-CM

## 2021-03-25 HISTORY — DX: Long term (current) use of opiate analgesic: Z79.891

## 2021-03-25 HISTORY — DX: Osteoarthritis of knee, unspecified: M17.9

## 2021-03-25 HISTORY — DX: Spondylosis without myelopathy or radiculopathy, lumbar region: M47.816

## 2021-04-10 DIAGNOSIS — M47816 Spondylosis without myelopathy or radiculopathy, lumbar region: Secondary | ICD-10-CM | POA: Diagnosis not present

## 2021-04-10 DIAGNOSIS — M47817 Spondylosis without myelopathy or radiculopathy, lumbosacral region: Secondary | ICD-10-CM | POA: Diagnosis not present

## 2021-04-17 DIAGNOSIS — M47816 Spondylosis without myelopathy or radiculopathy, lumbar region: Secondary | ICD-10-CM | POA: Diagnosis not present

## 2021-04-17 DIAGNOSIS — M47817 Spondylosis without myelopathy or radiculopathy, lumbosacral region: Secondary | ICD-10-CM | POA: Diagnosis not present

## 2021-04-22 ENCOUNTER — Ambulatory Visit: Admit: 2021-04-22 | Discharge: 2021-04-23 | Payer: MEDICARE

## 2021-04-22 DIAGNOSIS — I509 Heart failure, unspecified: Secondary | ICD-10-CM | POA: Diagnosis not present

## 2021-04-22 DIAGNOSIS — N400 Enlarged prostate without lower urinary tract symptoms: Secondary | ICD-10-CM | POA: Diagnosis not present

## 2021-04-22 DIAGNOSIS — N189 Chronic kidney disease, unspecified: Secondary | ICD-10-CM | POA: Diagnosis not present

## 2021-04-22 DIAGNOSIS — R197 Diarrhea, unspecified: Secondary | ICD-10-CM | POA: Diagnosis not present

## 2021-04-23 DIAGNOSIS — M1711 Unilateral primary osteoarthritis, right knee: Secondary | ICD-10-CM | POA: Diagnosis not present

## 2021-04-23 DIAGNOSIS — M545 Low back pain, unspecified: Secondary | ICD-10-CM | POA: Diagnosis not present

## 2021-04-23 DIAGNOSIS — M25562 Pain in left knee: Secondary | ICD-10-CM | POA: Diagnosis not present

## 2021-04-23 DIAGNOSIS — M25561 Pain in right knee: Secondary | ICD-10-CM | POA: Diagnosis not present

## 2021-04-23 DIAGNOSIS — Z96652 Presence of left artificial knee joint: Secondary | ICD-10-CM | POA: Diagnosis not present

## 2021-04-23 DIAGNOSIS — Z79891 Long term (current) use of opiate analgesic: Secondary | ICD-10-CM | POA: Diagnosis not present

## 2021-04-23 DIAGNOSIS — I10 Essential (primary) hypertension: Secondary | ICD-10-CM | POA: Diagnosis not present

## 2021-04-23 DIAGNOSIS — M47816 Spondylosis without myelopathy or radiculopathy, lumbar region: Secondary | ICD-10-CM | POA: Diagnosis not present

## 2021-04-23 DIAGNOSIS — Z6841 Body Mass Index (BMI) 40.0 and over, adult: Secondary | ICD-10-CM | POA: Diagnosis not present

## 2021-05-15 DIAGNOSIS — M47817 Spondylosis without myelopathy or radiculopathy, lumbosacral region: Secondary | ICD-10-CM | POA: Diagnosis not present

## 2021-05-15 DIAGNOSIS — M545 Low back pain, unspecified: Secondary | ICD-10-CM | POA: Diagnosis not present

## 2021-05-15 DIAGNOSIS — M47816 Spondylosis without myelopathy or radiculopathy, lumbar region: Secondary | ICD-10-CM | POA: Diagnosis not present

## 2021-05-22 DIAGNOSIS — E785 Hyperlipidemia, unspecified: Principal | ICD-10-CM

## 2021-05-22 DIAGNOSIS — I2510 Atherosclerotic heart disease of native coronary artery without angina pectoris: Principal | ICD-10-CM

## 2021-05-22 MED ORDER — ALIROCUMAB 75 MG/ML SUBCUTANEOUS PEN INJECTOR
SUBCUTANEOUS | 11 refills | 0 days | Status: CP
Start: 2021-05-22 — End: ?
  Filled 2021-05-23: qty 2, 28d supply, fill #0

## 2021-06-17 DIAGNOSIS — M79661 Pain in right lower leg: Secondary | ICD-10-CM | POA: Diagnosis not present

## 2021-06-17 DIAGNOSIS — G8929 Other chronic pain: Secondary | ICD-10-CM | POA: Diagnosis not present

## 2021-06-17 DIAGNOSIS — Z6841 Body Mass Index (BMI) 40.0 and over, adult: Secondary | ICD-10-CM | POA: Diagnosis not present

## 2021-06-17 DIAGNOSIS — E1140 Type 2 diabetes mellitus with diabetic neuropathy, unspecified: Secondary | ICD-10-CM | POA: Diagnosis not present

## 2021-06-17 DIAGNOSIS — M549 Dorsalgia, unspecified: Secondary | ICD-10-CM | POA: Diagnosis not present

## 2021-06-17 DIAGNOSIS — M25561 Pain in right knee: Secondary | ICD-10-CM | POA: Diagnosis not present

## 2021-06-17 DIAGNOSIS — G4700 Insomnia, unspecified: Secondary | ICD-10-CM | POA: Diagnosis not present

## 2021-06-17 DIAGNOSIS — E1169 Type 2 diabetes mellitus with other specified complication: Secondary | ICD-10-CM | POA: Diagnosis not present

## 2021-06-25 DIAGNOSIS — E785 Hyperlipidemia, unspecified: Principal | ICD-10-CM

## 2021-06-25 DIAGNOSIS — I2510 Atherosclerotic heart disease of native coronary artery without angina pectoris: Principal | ICD-10-CM

## 2021-06-25 MED FILL — PRALUENT PEN 75 MG/ML SUBCUTANEOUS PEN INJECTOR: SUBCUTANEOUS | 28 days supply | Qty: 2 | Fill #1

## 2021-06-27 ENCOUNTER — Ambulatory Visit: Admit: 2021-06-27 | Discharge: 2021-06-28 | Payer: MEDICARE

## 2021-06-27 DIAGNOSIS — E1169 Type 2 diabetes mellitus with other specified complication: Principal | ICD-10-CM

## 2021-06-27 DIAGNOSIS — Z125 Encounter for screening for malignant neoplasm of prostate: Principal | ICD-10-CM

## 2021-06-27 DIAGNOSIS — N521 Erectile dysfunction due to diseases classified elsewhere: Principal | ICD-10-CM

## 2021-06-27 DIAGNOSIS — E291 Testicular hypofunction: Principal | ICD-10-CM

## 2021-07-30 DIAGNOSIS — M179 Osteoarthritis of knee, unspecified: Secondary | ICD-10-CM | POA: Diagnosis not present

## 2021-07-30 DIAGNOSIS — M549 Dorsalgia, unspecified: Secondary | ICD-10-CM | POA: Diagnosis not present

## 2021-07-30 DIAGNOSIS — M47816 Spondylosis without myelopathy or radiculopathy, lumbar region: Secondary | ICD-10-CM | POA: Diagnosis not present

## 2021-07-30 DIAGNOSIS — G894 Chronic pain syndrome: Secondary | ICD-10-CM | POA: Diagnosis not present

## 2021-07-30 DIAGNOSIS — Z1389 Encounter for screening for other disorder: Secondary | ICD-10-CM | POA: Diagnosis not present

## 2021-08-13 MED FILL — REPATHA SURECLICK 140 MG/ML SUBCUTANEOUS PEN INJECTOR: SUBCUTANEOUS | 28 days supply | Qty: 2 | Fill #1

## 2021-08-29 ENCOUNTER — Ambulatory Visit: Admit: 2021-08-29 | Discharge: 2021-08-30 | Payer: MEDICARE

## 2021-08-29 DIAGNOSIS — G8929 Other chronic pain: Principal | ICD-10-CM

## 2021-08-29 DIAGNOSIS — M48061 Spinal stenosis, lumbar region without neurogenic claudication: Principal | ICD-10-CM

## 2021-08-29 DIAGNOSIS — M5441 Lumbago with sciatica, right side: Principal | ICD-10-CM

## 2021-08-29 DIAGNOSIS — M47816 Spondylosis without myelopathy or radiculopathy, lumbar region: Principal | ICD-10-CM

## 2021-09-09 DIAGNOSIS — M179 Osteoarthritis of knee, unspecified: Secondary | ICD-10-CM | POA: Diagnosis not present

## 2021-09-09 DIAGNOSIS — M549 Dorsalgia, unspecified: Secondary | ICD-10-CM | POA: Diagnosis not present

## 2021-09-09 DIAGNOSIS — G894 Chronic pain syndrome: Secondary | ICD-10-CM | POA: Diagnosis not present

## 2021-09-09 DIAGNOSIS — Z1389 Encounter for screening for other disorder: Secondary | ICD-10-CM | POA: Diagnosis not present

## 2021-09-09 DIAGNOSIS — M47816 Spondylosis without myelopathy or radiculopathy, lumbar region: Secondary | ICD-10-CM | POA: Diagnosis not present

## 2021-09-11 MED FILL — REPATHA SURECLICK 140 MG/ML SUBCUTANEOUS PEN INJECTOR: SUBCUTANEOUS | 28 days supply | Qty: 2 | Fill #2

## 2021-09-17 DIAGNOSIS — I10 Essential (primary) hypertension: Secondary | ICD-10-CM | POA: Diagnosis not present

## 2021-09-17 DIAGNOSIS — E1140 Type 2 diabetes mellitus with diabetic neuropathy, unspecified: Secondary | ICD-10-CM | POA: Diagnosis not present

## 2021-09-17 DIAGNOSIS — G894 Chronic pain syndrome: Secondary | ICD-10-CM | POA: Diagnosis not present

## 2021-09-17 DIAGNOSIS — F32A Depression, unspecified: Secondary | ICD-10-CM | POA: Diagnosis not present

## 2021-09-25 ENCOUNTER — Ambulatory Visit: Admit: 2021-09-25 | Discharge: 2021-09-26 | Payer: MEDICARE | Attending: Anesthesiology | Primary: Anesthesiology

## 2021-09-25 ENCOUNTER — Ambulatory Visit: Admit: 2021-09-25 | Discharge: 2021-09-26 | Payer: MEDICARE

## 2021-09-25 DIAGNOSIS — M5136 Other intervertebral disc degeneration, lumbar region: Principal | ICD-10-CM

## 2021-09-25 DIAGNOSIS — M5417 Radiculopathy, lumbosacral region: Secondary | ICD-10-CM | POA: Diagnosis not present

## 2021-09-25 DIAGNOSIS — M48061 Spinal stenosis, lumbar region without neurogenic claudication: Secondary | ICD-10-CM | POA: Diagnosis not present

## 2021-09-30 ENCOUNTER — Ambulatory Visit
Admit: 2021-09-30 | Discharge: 2021-10-01 | Disposition: A | Discharge status: Home / Self Care | Payer: MEDICARE | Attending: Family Medicine

## 2021-09-30 ENCOUNTER — Emergency Department
Admit: 2021-09-30 | Discharge: 2021-10-01 | Disposition: A | Discharge status: Home / Self Care | Payer: MEDICARE | Attending: Family Medicine

## 2021-09-30 DIAGNOSIS — R0602 Shortness of breath: Secondary | ICD-10-CM | POA: Diagnosis not present

## 2021-09-30 DIAGNOSIS — S299XXA Unspecified injury of thorax, initial encounter: Secondary | ICD-10-CM | POA: Diagnosis not present

## 2021-09-30 DIAGNOSIS — I130 Hypertensive heart and chronic kidney disease with heart failure and stage 1 through stage 4 chronic kidney disease, or unspecified chronic kidney disease: Secondary | ICD-10-CM | POA: Diagnosis not present

## 2021-09-30 DIAGNOSIS — R0902 Hypoxemia: Secondary | ICD-10-CM | POA: Diagnosis not present

## 2021-09-30 DIAGNOSIS — R059 Cough, unspecified: Secondary | ICD-10-CM | POA: Diagnosis not present

## 2021-09-30 DIAGNOSIS — E1142 Type 2 diabetes mellitus with diabetic polyneuropathy: Secondary | ICD-10-CM | POA: Diagnosis not present

## 2021-09-30 DIAGNOSIS — R0789 Other chest pain: Secondary | ICD-10-CM | POA: Diagnosis not present

## 2021-09-30 DIAGNOSIS — I2510 Atherosclerotic heart disease of native coronary artery without angina pectoris: Secondary | ICD-10-CM | POA: Diagnosis not present

## 2021-09-30 DIAGNOSIS — R0781 Pleurodynia: Secondary | ICD-10-CM | POA: Diagnosis not present

## 2021-09-30 DIAGNOSIS — I5032 Chronic diastolic (congestive) heart failure: Secondary | ICD-10-CM | POA: Diagnosis not present

## 2021-09-30 DIAGNOSIS — E785 Hyperlipidemia, unspecified: Secondary | ICD-10-CM | POA: Diagnosis not present

## 2021-09-30 DIAGNOSIS — E1122 Type 2 diabetes mellitus with diabetic chronic kidney disease: Secondary | ICD-10-CM | POA: Diagnosis not present

## 2021-09-30 DIAGNOSIS — Z7982 Long term (current) use of aspirin: Secondary | ICD-10-CM | POA: Diagnosis not present

## 2021-10-03 DIAGNOSIS — G4733 Obstructive sleep apnea (adult) (pediatric): Secondary | ICD-10-CM | POA: Diagnosis not present

## 2021-10-03 DIAGNOSIS — Z9981 Dependence on supplemental oxygen: Secondary | ICD-10-CM | POA: Diagnosis not present

## 2021-10-03 DIAGNOSIS — W19XXXD Unspecified fall, subsequent encounter: Secondary | ICD-10-CM | POA: Diagnosis not present

## 2021-10-03 DIAGNOSIS — R0781 Pleurodynia: Secondary | ICD-10-CM | POA: Diagnosis not present

## 2021-10-05 DIAGNOSIS — R0902 Hypoxemia: Secondary | ICD-10-CM | POA: Diagnosis not present

## 2021-10-08 ENCOUNTER — Ambulatory Visit: Admit: 2021-10-08 | Discharge: 2021-10-09 | Payer: MEDICARE

## 2021-10-08 DIAGNOSIS — M47816 Spondylosis without myelopathy or radiculopathy, lumbar region: Principal | ICD-10-CM

## 2021-10-08 DIAGNOSIS — Z6841 Body Mass Index (BMI) 40.0 and over, adult: Principal | ICD-10-CM

## 2021-10-08 DIAGNOSIS — M5441 Lumbago with sciatica, right side: Principal | ICD-10-CM

## 2021-10-08 DIAGNOSIS — M48061 Spinal stenosis, lumbar region without neurogenic claudication: Principal | ICD-10-CM

## 2021-10-08 DIAGNOSIS — G8929 Other chronic pain: Principal | ICD-10-CM

## 2021-10-11 DIAGNOSIS — G894 Chronic pain syndrome: Secondary | ICD-10-CM | POA: Diagnosis not present

## 2021-10-11 DIAGNOSIS — M47816 Spondylosis without myelopathy or radiculopathy, lumbar region: Secondary | ICD-10-CM | POA: Diagnosis not present

## 2021-10-11 DIAGNOSIS — M179 Osteoarthritis of knee, unspecified: Secondary | ICD-10-CM | POA: Diagnosis not present

## 2021-10-11 DIAGNOSIS — Z1389 Encounter for screening for other disorder: Secondary | ICD-10-CM | POA: Diagnosis not present

## 2021-10-11 DIAGNOSIS — M549 Dorsalgia, unspecified: Secondary | ICD-10-CM | POA: Diagnosis not present

## 2021-10-25 DIAGNOSIS — G4733 Obstructive sleep apnea (adult) (pediatric): Principal | ICD-10-CM

## 2021-10-25 DIAGNOSIS — I509 Heart failure, unspecified: Principal | ICD-10-CM

## 2021-10-25 DIAGNOSIS — I10 Essential (primary) hypertension: Principal | ICD-10-CM

## 2021-10-25 DIAGNOSIS — J9610 Chronic respiratory failure, unspecified whether with hypoxia or hypercapnia: Principal | ICD-10-CM

## 2021-10-29 ENCOUNTER — Ambulatory Visit: Payer: Medicare HMO | Admitting: Podiatry

## 2021-10-29 DIAGNOSIS — L603 Nail dystrophy: Secondary | ICD-10-CM | POA: Diagnosis not present

## 2021-10-29 DIAGNOSIS — M2041 Other hammer toe(s) (acquired), right foot: Secondary | ICD-10-CM | POA: Diagnosis not present

## 2021-10-29 DIAGNOSIS — E1100 Type 2 diabetes mellitus with hyperosmolarity without nonketotic hyperglycemic-hyperosmolar coma (NKHHC): Secondary | ICD-10-CM

## 2021-10-29 DIAGNOSIS — M2042 Other hammer toe(s) (acquired), left foot: Secondary | ICD-10-CM | POA: Diagnosis not present

## 2021-10-29 MED FILL — REPATHA SURECLICK 140 MG/ML SUBCUTANEOUS PEN INJECTOR: SUBCUTANEOUS | 28 days supply | Qty: 2 | Fill #3

## 2021-11-04 ENCOUNTER — Telehealth: Payer: Self-pay | Admitting: *Deleted

## 2021-11-04 NOTE — Telephone Encounter (Signed)
Patient is requesting an antibiotic, believes his toe is infected from toenail that was removed one week ago, having a lot of draining.  Please advise.

## 2021-11-05 MED ORDER — DOXYCYCLINE HYCLATE 100 MG PO TABS: 100.0000 mg | ORAL_TABLET | Freq: Two times a day (BID) | ORAL | 0 refills | Status: DC

## 2021-11-05 NOTE — Progress Notes (Signed)
Subjective:  Patient ID: Benjamin Bush, male    DOB: 06/09/1955,  MRN: [id number]  Chief Complaint  Patient presents with   Diabetes    Nail trim     66 y.o. male presents with the above complaint.  Patient presents with thickened elongated dystrophic mycotic painful left hallux nail.  Patient states pain for touch is progressive gotten worse.  He is a diabetic.  He would like to have it removed.  He has not seen anyone else prior to seeing me for this.  He is also interested in getting diabetic shoes.  He denies any other acute complaints.  Hurts with ambulation and hurts with pressure he is hammertoes have also been hurting due to tighter shoes.  He he his last A1c was7.2   Review of Systems: Negative except as noted in the HPI. Denies N/V/F/Ch.  Past Medical History:  Diagnosis Date   (HFpEF) heart failure with preserved ejection fraction ([place])    a. 06/2016 Echo: Ef >55%, gr1 DD, dil RV w/ nl syst fxn, mildly dil LA.   CAD (coronary artery disease)    a. s/p stenting x 2 (RCA/LCX)- [place], [place]; b. 2014 Cath: mild RCA/LCX ISR.   Depression    Diabetes mellitus without complication ([place])    Headache    Hypercholesteremia    Hypertension    Morbid obesity (HCC)    OSA (obstructive sleep apnea)    PFO (patent foramen ovale)    a. s/p percutaneous closure @ [facility], [facility]).   PUD (peptic ulcer disease)     Current Outpatient Medications:    acetaminophen (TYLENOL) 500 MG tablet, Take by mouth., Disp: , Rfl:    amitriptyline (ELAVIL) 25 MG tablet, , Disp: , Rfl:    amLODipine (NORVASC) 10 MG tablet, Take by mouth., Disp: , Rfl:    amLODipine (NORVASC) 10 MG tablet, Take 1 tablet by mouth 2 (two) times daily., Disp: , Rfl:    amoxicillin-clavulanate (AUGMENTIN) 875-125 MG tablet, Take 1 tablet by mouth 2 (two) times daily., Disp: 28 tablet, Rfl: 0   aspirin EC 81 MG tablet, Take 81 mg by mouth daily., Disp: , Rfl:    atorvastatin (LIPITOR) 80 MG tablet, Take 1 tablet (80  mg total) by mouth daily., Disp: 30 tablet, Rfl: 0   atorvastatin (LIPITOR) 80 MG tablet, Take 1 tablet by mouth daily., Disp: , Rfl:    buPROPion (WELLBUTRIN) 100 MG tablet, Take by mouth., Disp: , Rfl:    buPROPion (WELLBUTRIN) 100 MG tablet, Take by mouth., Disp: , Rfl:    Carboxymethylcellulose Sodium 0.25 % SOLN, Administer 2 drops to both eyes 4 (four) times a day as needed., Disp: , Rfl:    cephALEXin (KEFLEX) 500 MG capsule, Take 500 mg by mouth 2 (two) times daily., Disp: , Rfl:    cetirizine (ZYRTEC) 10 MG tablet, Take by mouth., Disp: , Rfl:    clopidogrel (PLAVIX) 75 MG tablet, Take 75 mg by mouth daily with breakfast., Disp: , Rfl:    clopidogrel (PLAVIX) 75 MG tablet, Take 1 tablet by mouth daily., Disp: , Rfl:    donepezil (ARICEPT) 10 MG tablet, Take by mouth., Disp: , Rfl:    doxycycline (VIBRA-TABS) 100 MG tablet, Take 1 tablet (100 mg total) by mouth 2 (two) times daily., Disp: 28 tablet, Rfl: 0   DULoxetine (CYMBALTA) 30 MG capsule, Take by mouth., Disp: , Rfl:    DULoxetine (CYMBALTA) 60 MG capsule, Take 60 mg by mouth daily., Disp: ,  Rfl:    Evolocumab (REPATHA SURECLICK) XX123456 MG/ML SOAJ, Inject into the skin., Disp: , Rfl:    ezetimibe (ZETIA) 10 MG tablet, Take 10 mg by mouth daily., Disp: , Rfl:    famotidine (PEPCID) 40 MG tablet, Take by mouth., Disp: , Rfl:    fexofenadine (ALLEGRA) 180 MG tablet, Take by mouth., Disp: , Rfl:    HYDROcodone-acetaminophen (NORCO) 10-325 MG tablet, 4 (four) times daily., Disp: , Rfl:    hydrOXYzine (ATARAX/VISTARIL) 25 MG tablet, Take by mouth., Disp: , Rfl:    insulin NPH Human (NOVOLIN N) 100 UNIT/ML injection, Inject into the skin., Disp: , Rfl:    Insulin Syringe-Needle U-100 (BD VEO INSULIN SYRINGE U/F) 31G X 15/64" 0.5 ML MISC, , Disp: , Rfl:    ipratropium (ATROVENT) 0.03 % nasal spray, 1-2 sprays per nostril up to 4 times daily as needed for runny nose or post nasal drip, Disp: , Rfl:    ipratropium-albuterol (DUONEB) 0.5-2.5  (3) MG/3ML SOLN, Inhale into the lungs., Disp: , Rfl:    isosorbide mononitrate (IMDUR) 60 MG 24 hr tablet, Take 60 mg by mouth daily., Disp: , Rfl:    lisinopril (ZESTRIL) 10 MG tablet, Take by mouth., Disp: , Rfl:    lisinopril (ZESTRIL) 10 MG tablet, Take by mouth., Disp: , Rfl:    magnesium oxide (MAG-OX) 400 MG tablet, Take by mouth., Disp: , Rfl:    methylPREDNISolone (MEDROL DOSEPAK) 4 MG TBPK tablet, See admin instructions. follow package directions, Disp: , Rfl:    metoprolol (LOPRESSOR) 100 MG tablet, Take 1 tablet (100 mg total) by mouth 2 (two) times daily., Disp: , Rfl:    Niacin CR 1000 MG TBCR, Take by mouth., Disp: , Rfl:    nitroGLYCERIN (NITROSTAT) 0.4 MG SL tablet, Place 1 tablet under the tongue every 5 (five) minutes x 2 doses as needed., Disp: , Rfl:    oxyCODONE-acetaminophen (PERCOCET) 10-325 MG tablet, Take 1 tablet by mouth every 6 (six) hours as needed for pain., Disp: , Rfl:    pantoprazole (PROTONIX) 40 MG tablet, Take 40 mg by mouth daily., Disp: , Rfl:    PARoxetine (PAXIL) 20 MG tablet, Take 1 tablet by mouth daily., Disp: , Rfl:    QUEtiapine (SEROQUEL) 50 MG tablet, Take 50 mg by mouth at bedtime., Disp: , Rfl:    ramipril (ALTACE) 2.5 MG capsule, Take by mouth., Disp: , Rfl:    ranolazine (RANEXA) 1000 MG SR tablet, Take by mouth., Disp: , Rfl:    rOPINIRole (REQUIP) 2 MG tablet, Take by mouth., Disp: , Rfl:    rosuvastatin (CRESTOR) 40 MG tablet, Take by mouth., Disp: , Rfl:    sulfamethoxazole-trimethoprim (BACTRIM) 400-80 MG tablet, Take 1 tablet by mouth 2 (two) times daily., Disp: 28 tablet, Rfl: 0   testosterone cypionate (DEPOTESTOSTERONE CYPIONATE) 200 MG/ML injection, Inject into the muscle., Disp: , Rfl:    testosterone cypionate (DEPOTESTOTERONE CYPIONATE) 100 MG/ML injection, SMARTSIG:Milliliter(s) IM, Disp: , Rfl:    tiZANidine (ZANAFLEX) 4 MG tablet, Instructions 1/2-1 tablet up to every 8 hours as needed for muscle spasm, Disp: , Rfl:     torsemide (DEMADEX) 20 MG tablet, Take 40 mg by mouth daily., Disp: , Rfl:   Social History   Tobacco Use  Smoking Status Never  Smokeless Tobacco Current   Types: Chew  Tobacco Comments   Chews tobacco    Allergies  Allergen Reactions   Codeine Swelling   Objective:  There were no vitals filed for this  visit. There is no height or weight on file to calculate BMI. Constitutional Well developed. Well nourished.  Vascular Dorsalis pedis pulses palpable bilaterally. Posterior tibial pulses palpable bilaterally. Capillary refill normal to all digits.  No cyanosis or clubbing noted. Pedal hair growth normal.  Neurologic Normal speech. Oriented to person, place, and time. Epicritic sensation to light touch grossly present bilaterally.  Dermatologic Pain on palpation of the entire/total nail on 1st digit of the left No other open wounds. No skin lesions.  Orthopedic: Normal joint ROM without pain or crepitus bilaterally. Hammertoe contractures noted of digits 2 through 5 bilaterally mild pain on palpation semiflexible in nature.   Radiographs: None Assessment:   1. Hammertoe, bilateral   2. Type 2 diabetes mellitus with hyperosmolarity without coma, unspecified whether long term insulin use ([place])   3. Nail dystrophy    Plan:  Patient was evaluated and treated and all questions answered.  Hammertoe bilateral -I noted the patient the etiology of hammertoe contractures and various treatment options were discussed.  Given that is uncontrolled diabetic with hammertoe contracture is high risk of developing ulceration.  He would benefit from diabetic shoes.  He was casted for diabetic shoes today.  Nail contusion/dystrophy hallux, left -Patient elects to proceed with minor surgery to remove entire toenail today. Consent reviewed and signed by patient. -Entire/total nail excised. See procedure note. -Educated on post-procedure care including soaking. Written instructions provided  and reviewed. -Patient to follow up in 2 weeks for nail check if needed. -I discussed with him that given that he is a diabetic he is a high risk of losing the toe or worsening of the wound.  He states understanding would like to proceed with the procedure regardless.  Procedure: Excision of entire/total nail with phenol matricectomy Location: Left 1st toe digit Anesthesia: Lidocaine 1% plain; 1.5 mL and Marcaine 0.5% plain; 1.5 mL, digital block. Skin Prep: Betadine. Dressing: Silvadene; telfa; dry, sterile, compression dressing. Technique: Following skin prep, the toe was exsanguinated and a tourniquet was secured at the base of the toe. The affected nail border was freed and excised.  Matricectomy was performed in standard technique to make it permanent.  This was washed with alcohol.  The tourniquet was then removed and sterile dressing applied. Disposition: Patient tolerated procedure well. Patient to return in 2 weeks for follow-up.   No follow-ups on file.

## 2021-11-05 NOTE — Telephone Encounter (Signed)
Pt called back today stating his toe is infected that the nail was removed a week ago.   Upon checking chart a antibiotic was called in but it was sent to the wrong pharmacy. Can we get it sent to [facility] in [place].

## 2021-11-05 NOTE — Telephone Encounter (Signed)
Lvm for pt that prescription was sent to the pharmacy he requested in [place]

## 2021-11-06 ENCOUNTER — Ambulatory Visit: Admit: 2021-11-06 | Payer: MEDICARE

## 2021-11-06 ENCOUNTER — Ambulatory Visit
Admit: 2021-11-06 | Discharge: 2021-11-16 | Disposition: A | Discharge status: Home Health | Payer: MEDICARE | Admitting: Infectious Disease

## 2021-11-06 DIAGNOSIS — N189 Chronic kidney disease, unspecified: Secondary | ICD-10-CM | POA: Diagnosis not present

## 2021-11-06 DIAGNOSIS — I272 Pulmonary hypertension, unspecified: Secondary | ICD-10-CM | POA: Diagnosis not present

## 2021-11-06 DIAGNOSIS — I5032 Chronic diastolic (congestive) heart failure: Secondary | ICD-10-CM | POA: Diagnosis not present

## 2021-11-06 DIAGNOSIS — M79675 Pain in left toe(s): Secondary | ICD-10-CM | POA: Diagnosis not present

## 2021-11-06 DIAGNOSIS — I130 Hypertensive heart and chronic kidney disease with heart failure and stage 1 through stage 4 chronic kidney disease, or unspecified chronic kidney disease: Secondary | ICD-10-CM | POA: Diagnosis not present

## 2021-11-06 DIAGNOSIS — R058 Other specified cough: Secondary | ICD-10-CM | POA: Diagnosis not present

## 2021-11-06 DIAGNOSIS — N179 Acute kidney failure, unspecified: Secondary | ICD-10-CM | POA: Diagnosis not present

## 2021-11-06 DIAGNOSIS — M868X7 Other osteomyelitis, ankle and foot: Secondary | ICD-10-CM | POA: Diagnosis not present

## 2021-11-06 DIAGNOSIS — I491 Atrial premature depolarization: Secondary | ICD-10-CM | POA: Diagnosis not present

## 2021-11-06 DIAGNOSIS — I5030 Unspecified diastolic (congestive) heart failure: Secondary | ICD-10-CM | POA: Diagnosis not present

## 2021-11-06 DIAGNOSIS — R0602 Shortness of breath: Secondary | ICD-10-CM | POA: Diagnosis not present

## 2021-11-06 DIAGNOSIS — J9809 Other diseases of bronchus, not elsewhere classified: Secondary | ICD-10-CM | POA: Diagnosis not present

## 2021-11-06 DIAGNOSIS — M869 Osteomyelitis, unspecified: Secondary | ICD-10-CM | POA: Diagnosis not present

## 2021-11-06 DIAGNOSIS — E1169 Type 2 diabetes mellitus with other specified complication: Secondary | ICD-10-CM | POA: Diagnosis not present

## 2021-11-06 DIAGNOSIS — M86172 Other acute osteomyelitis, left ankle and foot: Secondary | ICD-10-CM | POA: Diagnosis not present

## 2021-11-06 DIAGNOSIS — Z6841 Body Mass Index (BMI) 40.0 and over, adult: Secondary | ICD-10-CM | POA: Diagnosis not present

## 2021-11-06 DIAGNOSIS — R079 Chest pain, unspecified: Secondary | ICD-10-CM | POA: Diagnosis not present

## 2021-11-06 DIAGNOSIS — M7989 Other specified soft tissue disorders: Secondary | ICD-10-CM | POA: Diagnosis not present

## 2021-11-06 DIAGNOSIS — L089 Local infection of the skin and subcutaneous tissue, unspecified: Secondary | ICD-10-CM | POA: Diagnosis not present

## 2021-11-06 DIAGNOSIS — J441 Chronic obstructive pulmonary disease with (acute) exacerbation: Secondary | ICD-10-CM | POA: Diagnosis not present

## 2021-11-06 DIAGNOSIS — R918 Other nonspecific abnormal finding of lung field: Secondary | ICD-10-CM | POA: Diagnosis not present

## 2021-11-06 DIAGNOSIS — I2510 Atherosclerotic heart disease of native coronary artery without angina pectoris: Secondary | ICD-10-CM | POA: Diagnosis not present

## 2021-11-06 DIAGNOSIS — J984 Other disorders of lung: Secondary | ICD-10-CM | POA: Diagnosis not present

## 2021-11-06 DIAGNOSIS — J9622 Acute and chronic respiratory failure with hypercapnia: Secondary | ICD-10-CM | POA: Diagnosis not present

## 2021-11-06 DIAGNOSIS — E662 Morbid (severe) obesity with alveolar hypoventilation: Secondary | ICD-10-CM | POA: Diagnosis not present

## 2021-11-06 DIAGNOSIS — S91202A Unspecified open wound of left great toe with damage to nail, initial encounter: Secondary | ICD-10-CM | POA: Diagnosis not present

## 2021-11-06 DIAGNOSIS — R0689 Other abnormalities of breathing: Secondary | ICD-10-CM | POA: Diagnosis not present

## 2021-11-06 DIAGNOSIS — Z20822 Contact with and (suspected) exposure to covid-19: Secondary | ICD-10-CM | POA: Diagnosis not present

## 2021-11-06 DIAGNOSIS — J9621 Acute and chronic respiratory failure with hypoxia: Secondary | ICD-10-CM | POA: Diagnosis not present

## 2021-11-06 DIAGNOSIS — I509 Heart failure, unspecified: Secondary | ICD-10-CM | POA: Diagnosis not present

## 2021-11-06 DIAGNOSIS — R0789 Other chest pain: Secondary | ICD-10-CM | POA: Diagnosis not present

## 2021-11-06 DIAGNOSIS — E1122 Type 2 diabetes mellitus with diabetic chronic kidney disease: Secondary | ICD-10-CM | POA: Diagnosis not present

## 2021-11-08 DIAGNOSIS — J9809 Other diseases of bronchus, not elsewhere classified: Secondary | ICD-10-CM | POA: Insufficient documentation

## 2021-11-08 DIAGNOSIS — Z86711 Personal history of pulmonary embolism: Secondary | ICD-10-CM | POA: Insufficient documentation

## 2021-11-08 HISTORY — DX: Other diseases of bronchus, not elsewhere classified: J98.09

## 2021-11-08 MED ORDER — JARDIANCE 10 MG TABLET
ORAL_TABLET | 0 refills | 0 days
Start: 2021-11-08 — End: 2021-11-08

## 2021-11-08 MED ORDER — INSULIN GLARGINE (U-100) 100 UNIT/ML (3 ML) SUBCUTANEOUS PEN
SUBCUTANEOUS | 0 refills | 0 days
Start: 2021-11-08 — End: 2021-11-08

## 2021-11-16 DIAGNOSIS — J9611 Chronic respiratory failure with hypoxia: Principal | ICD-10-CM

## 2021-11-16 MED ORDER — AMOXICILLIN 875 MG-POTASSIUM CLAVULANATE 125 MG TABLET
ORAL_TABLET | Freq: Two times a day (BID) | ORAL | 0 refills | 25.00000 days | Status: CP
Start: 2021-11-16 — End: 2021-11-16

## 2021-11-20 DIAGNOSIS — J441 Chronic obstructive pulmonary disease with (acute) exacerbation: Secondary | ICD-10-CM | POA: Diagnosis not present

## 2021-11-20 DIAGNOSIS — J9621 Acute and chronic respiratory failure with hypoxia: Secondary | ICD-10-CM | POA: Diagnosis not present

## 2021-11-20 DIAGNOSIS — M869 Osteomyelitis, unspecified: Secondary | ICD-10-CM | POA: Diagnosis not present

## 2021-11-29 DIAGNOSIS — M179 Osteoarthritis of knee, unspecified: Secondary | ICD-10-CM | POA: Diagnosis not present

## 2021-11-29 DIAGNOSIS — M549 Dorsalgia, unspecified: Secondary | ICD-10-CM | POA: Diagnosis not present

## 2021-11-29 DIAGNOSIS — Z1389 Encounter for screening for other disorder: Secondary | ICD-10-CM | POA: Diagnosis not present

## 2021-11-29 DIAGNOSIS — M47816 Spondylosis without myelopathy or radiculopathy, lumbar region: Secondary | ICD-10-CM | POA: Diagnosis not present

## 2021-11-29 DIAGNOSIS — G894 Chronic pain syndrome: Secondary | ICD-10-CM | POA: Diagnosis not present

## 2021-12-03 DIAGNOSIS — R062 Wheezing: Secondary | ICD-10-CM | POA: Diagnosis not present

## 2021-12-03 DIAGNOSIS — R059 Cough, unspecified: Secondary | ICD-10-CM | POA: Diagnosis not present

## 2021-12-05 ENCOUNTER — Ambulatory Visit: Admit: 2021-12-05 | Discharge: 2021-12-06 | Payer: MEDICARE

## 2021-12-05 DIAGNOSIS — J9611 Chronic respiratory failure with hypoxia: Secondary | ICD-10-CM | POA: Diagnosis not present

## 2021-12-05 DIAGNOSIS — R0600 Dyspnea, unspecified: Secondary | ICD-10-CM | POA: Diagnosis not present

## 2021-12-05 DIAGNOSIS — R062 Wheezing: Secondary | ICD-10-CM | POA: Diagnosis not present

## 2021-12-05 DIAGNOSIS — J449 Chronic obstructive pulmonary disease, unspecified: Secondary | ICD-10-CM | POA: Diagnosis not present

## 2021-12-05 MED FILL — REPATHA SURECLICK 140 MG/ML SUBCUTANEOUS PEN INJECTOR: SUBCUTANEOUS | 28 days supply | Qty: 2 | Fill #4

## 2021-12-11 ENCOUNTER — Ambulatory Visit
Admit: 2021-12-11 | Discharge: 2021-12-12 | Payer: MEDICARE | Attending: Infectious Disease | Primary: Infectious Disease

## 2021-12-11 DIAGNOSIS — S90412S Abrasion, left great toe, sequela: Principal | ICD-10-CM

## 2021-12-11 DIAGNOSIS — L089 Local infection of the skin and subcutaneous tissue, unspecified: Principal | ICD-10-CM

## 2021-12-11 DIAGNOSIS — L03039 Cellulitis of unspecified toe: Principal | ICD-10-CM

## 2021-12-11 MED ORDER — AMOXICILLIN 875 MG-POTASSIUM CLAVULANATE 125 MG TABLET
ORAL_TABLET | Freq: Two times a day (BID) | ORAL | 0 refills | 14 days | Status: CP
Start: 2021-12-11 — End: 2021-12-25

## 2021-12-11 MED ORDER — KETOCONAZOLE 2 % TOPICAL CREAM
Freq: Every day | TOPICAL | 11 refills | 30 days | Status: CP
Start: 2021-12-11 — End: 2022-12-11

## 2021-12-16 DIAGNOSIS — I509 Heart failure, unspecified: Secondary | ICD-10-CM | POA: Diagnosis not present

## 2021-12-16 DIAGNOSIS — N1831 Chronic kidney disease, stage 3a: Secondary | ICD-10-CM | POA: Diagnosis not present

## 2021-12-16 DIAGNOSIS — E1121 Type 2 diabetes mellitus with diabetic nephropathy: Secondary | ICD-10-CM | POA: Diagnosis not present

## 2021-12-16 DIAGNOSIS — M869 Osteomyelitis, unspecified: Secondary | ICD-10-CM | POA: Diagnosis not present

## 2021-12-16 DIAGNOSIS — E1169 Type 2 diabetes mellitus with other specified complication: Secondary | ICD-10-CM | POA: Diagnosis not present

## 2021-12-16 DIAGNOSIS — J9621 Acute and chronic respiratory failure with hypoxia: Secondary | ICD-10-CM | POA: Diagnosis not present

## 2021-12-16 DIAGNOSIS — Z0000 Encounter for general adult medical examination without abnormal findings: Secondary | ICD-10-CM | POA: Diagnosis not present

## 2021-12-16 DIAGNOSIS — E1140 Type 2 diabetes mellitus with diabetic neuropathy, unspecified: Secondary | ICD-10-CM | POA: Diagnosis not present

## 2021-12-16 DIAGNOSIS — E782 Mixed hyperlipidemia: Secondary | ICD-10-CM | POA: Diagnosis not present

## 2021-12-16 DIAGNOSIS — I2510 Atherosclerotic heart disease of native coronary artery without angina pectoris: Secondary | ICD-10-CM | POA: Diagnosis not present

## 2021-12-16 DIAGNOSIS — N400 Enlarged prostate without lower urinary tract symptoms: Secondary | ICD-10-CM | POA: Diagnosis not present

## 2021-12-16 DIAGNOSIS — J441 Chronic obstructive pulmonary disease with (acute) exacerbation: Secondary | ICD-10-CM | POA: Diagnosis not present

## 2021-12-16 DIAGNOSIS — Z6841 Body Mass Index (BMI) 40.0 and over, adult: Secondary | ICD-10-CM | POA: Diagnosis not present

## 2021-12-23 ENCOUNTER — Ambulatory Visit: Admit: 2021-12-23 | Discharge: 2021-12-23 | Payer: MEDICARE

## 2021-12-23 DIAGNOSIS — J9621 Acute and chronic respiratory failure with hypoxia: Principal | ICD-10-CM

## 2021-12-23 DIAGNOSIS — J9809 Other diseases of bronchus, not elsewhere classified: Principal | ICD-10-CM

## 2021-12-23 DIAGNOSIS — E662 Morbid (severe) obesity with alveolar hypoventilation: Principal | ICD-10-CM

## 2021-12-23 DIAGNOSIS — J9622 Acute and chronic respiratory failure with hypercapnia: Principal | ICD-10-CM

## 2021-12-23 DIAGNOSIS — G4733 Obstructive sleep apnea (adult) (pediatric): Principal | ICD-10-CM

## 2021-12-23 DIAGNOSIS — Z6841 Body Mass Index (BMI) 40.0 and over, adult: Secondary | ICD-10-CM | POA: Diagnosis not present

## 2021-12-23 MED ORDER — ALBUTEROL SULFATE HFA 90 MCG/ACTUATION AEROSOL INHALER
Freq: Four times a day (QID) | RESPIRATORY_TRACT | 6 refills | 0 days | Status: CP | PRN
Start: 2021-12-23 — End: 2022-12-23

## 2021-12-23 MED ORDER — FLUTICASONE PROPIONATE 110 MCG/ACTUATION HFA AEROSOL INHALER
Freq: Two times a day (BID) | RESPIRATORY_TRACT | 11 refills | 0 days | Status: CP
Start: 2021-12-23 — End: 2022-12-23

## 2021-12-26 ENCOUNTER — Ambulatory Visit: Admit: 2021-12-26 | Discharge: 2021-12-27 | Payer: MEDICARE

## 2021-12-26 DIAGNOSIS — B351 Tinea unguium: Principal | ICD-10-CM

## 2021-12-26 DIAGNOSIS — L03039 Cellulitis of unspecified toe: Principal | ICD-10-CM

## 2021-12-26 DIAGNOSIS — M869 Osteomyelitis, unspecified: Principal | ICD-10-CM

## 2021-12-26 DIAGNOSIS — S90412S Abrasion, left great toe, sequela: Principal | ICD-10-CM

## 2021-12-26 DIAGNOSIS — L089 Local infection of the skin and subcutaneous tissue, unspecified: Principal | ICD-10-CM

## 2021-12-26 DIAGNOSIS — N189 Chronic kidney disease, unspecified: Secondary | ICD-10-CM | POA: Diagnosis not present

## 2021-12-26 DIAGNOSIS — E1142 Type 2 diabetes mellitus with diabetic polyneuropathy: Secondary | ICD-10-CM | POA: Diagnosis not present

## 2021-12-26 DIAGNOSIS — I5032 Chronic diastolic (congestive) heart failure: Secondary | ICD-10-CM | POA: Diagnosis not present

## 2021-12-26 DIAGNOSIS — I2510 Atherosclerotic heart disease of native coronary artery without angina pectoris: Secondary | ICD-10-CM | POA: Diagnosis not present

## 2021-12-26 DIAGNOSIS — I130 Hypertensive heart and chronic kidney disease with heart failure and stage 1 through stage 4 chronic kidney disease, or unspecified chronic kidney disease: Secondary | ICD-10-CM | POA: Diagnosis not present

## 2021-12-26 DIAGNOSIS — E1122 Type 2 diabetes mellitus with diabetic chronic kidney disease: Secondary | ICD-10-CM | POA: Diagnosis not present

## 2021-12-26 DIAGNOSIS — E785 Hyperlipidemia, unspecified: Secondary | ICD-10-CM | POA: Diagnosis not present

## 2021-12-26 DIAGNOSIS — M79672 Pain in left foot: Secondary | ICD-10-CM | POA: Diagnosis not present

## 2021-12-26 MED ORDER — KETOCONAZOLE 2 % TOPICAL CREAM
Freq: Two times a day (BID) | TOPICAL | 5 refills | 60.00000 days | Status: CP
Start: 2021-12-26 — End: 2021-12-26

## 2021-12-31 DIAGNOSIS — M549 Dorsalgia, unspecified: Secondary | ICD-10-CM | POA: Diagnosis not present

## 2021-12-31 DIAGNOSIS — G894 Chronic pain syndrome: Secondary | ICD-10-CM | POA: Diagnosis not present

## 2021-12-31 DIAGNOSIS — Z1389 Encounter for screening for other disorder: Secondary | ICD-10-CM | POA: Diagnosis not present

## 2021-12-31 DIAGNOSIS — M179 Osteoarthritis of knee, unspecified: Secondary | ICD-10-CM | POA: Diagnosis not present

## 2021-12-31 DIAGNOSIS — M47816 Spondylosis without myelopathy or radiculopathy, lumbar region: Secondary | ICD-10-CM | POA: Diagnosis not present

## 2022-01-02 ENCOUNTER — Ambulatory Visit: Admit: 2022-01-02 | Discharge: 2022-01-03 | Payer: MEDICARE | Attending: Family | Primary: Family

## 2022-01-02 DIAGNOSIS — I2510 Atherosclerotic heart disease of native coronary artery without angina pectoris: Principal | ICD-10-CM

## 2022-01-02 DIAGNOSIS — Z794 Long term (current) use of insulin: Principal | ICD-10-CM

## 2022-01-02 DIAGNOSIS — E1165 Type 2 diabetes mellitus with hyperglycemia: Principal | ICD-10-CM

## 2022-01-02 DIAGNOSIS — I10 Essential (primary) hypertension: Secondary | ICD-10-CM | POA: Diagnosis not present

## 2022-01-02 DIAGNOSIS — G4700 Insomnia, unspecified: Secondary | ICD-10-CM | POA: Diagnosis not present

## 2022-01-02 DIAGNOSIS — M545 Low back pain, unspecified: Secondary | ICD-10-CM | POA: Diagnosis not present

## 2022-01-02 DIAGNOSIS — G4733 Obstructive sleep apnea (adult) (pediatric): Secondary | ICD-10-CM | POA: Diagnosis not present

## 2022-01-02 DIAGNOSIS — E785 Hyperlipidemia, unspecified: Secondary | ICD-10-CM | POA: Diagnosis not present

## 2022-01-02 MED ORDER — METOPROLOL TARTRATE 50 MG TABLET
ORAL_TABLET | Freq: Two times a day (BID) | ORAL | 0 refills | 30 days | Status: CP
Start: 2022-01-02 — End: 2022-01-02

## 2022-01-02 MED ORDER — TRAZODONE 50 MG TABLET
ORAL_TABLET | Freq: Every evening | ORAL | 0 refills | 90 days | Status: CP
Start: 2022-01-02 — End: 2022-07-01

## 2022-01-02 MED ORDER — EZETIMIBE 10 MG TABLET
ORAL_TABLET | Freq: Every day | ORAL | 0 refills | 90 days | Status: CP
Start: 2022-01-02 — End: 2022-01-02

## 2022-01-03 ENCOUNTER — Ambulatory Visit: Admit: 2022-01-03 | Discharge: 2022-01-04 | Payer: MEDICARE

## 2022-01-03 DIAGNOSIS — Z09 Encounter for follow-up examination after completed treatment for conditions other than malignant neoplasm: Secondary | ICD-10-CM | POA: Diagnosis not present

## 2022-01-03 DIAGNOSIS — Z8739 Personal history of other diseases of the musculoskeletal system and connective tissue: Secondary | ICD-10-CM | POA: Diagnosis not present

## 2022-01-03 DIAGNOSIS — G4700 Insomnia, unspecified: Secondary | ICD-10-CM

## 2022-01-03 DIAGNOSIS — M869 Osteomyelitis, unspecified: Secondary | ICD-10-CM | POA: Diagnosis not present

## 2022-01-03 HISTORY — DX: Insomnia, unspecified: G47.00

## 2022-01-16 DIAGNOSIS — J9611 Chronic respiratory failure with hypoxia: Principal | ICD-10-CM

## 2022-01-16 DIAGNOSIS — H524 Presbyopia: Secondary | ICD-10-CM | POA: Diagnosis not present

## 2022-01-16 MED ORDER — ARNUITY ELLIPTA 100 MCG/ACTUATION POWDER FOR INHALATION
Freq: Every day | RESPIRATORY_TRACT | 11 refills | 30 days | Status: CP
Start: 2022-01-16 — End: ?

## 2022-01-17 DIAGNOSIS — Z0100 Encounter for examination of eyes and vision without abnormal findings: Secondary | ICD-10-CM | POA: Diagnosis not present

## 2022-01-21 ENCOUNTER — Ambulatory Visit: Admit: 2022-01-21 | Discharge: 2022-01-21 | Payer: MEDICARE

## 2022-01-21 DIAGNOSIS — M545 Lumbar back pain: Principal | ICD-10-CM

## 2022-01-21 DIAGNOSIS — Z7722 Contact with and (suspected) exposure to environmental tobacco smoke (acute) (chronic): Secondary | ICD-10-CM | POA: Diagnosis not present

## 2022-01-21 DIAGNOSIS — M48061 Spinal stenosis, lumbar region without neurogenic claudication: Secondary | ICD-10-CM | POA: Diagnosis not present

## 2022-01-21 DIAGNOSIS — I509 Heart failure, unspecified: Secondary | ICD-10-CM | POA: Diagnosis not present

## 2022-01-21 DIAGNOSIS — E669 Obesity, unspecified: Secondary | ICD-10-CM | POA: Diagnosis not present

## 2022-01-21 DIAGNOSIS — E785 Hyperlipidemia, unspecified: Secondary | ICD-10-CM | POA: Diagnosis not present

## 2022-01-21 DIAGNOSIS — E1122 Type 2 diabetes mellitus with diabetic chronic kidney disease: Secondary | ICD-10-CM | POA: Diagnosis not present

## 2022-01-21 DIAGNOSIS — M47816 Spondylosis without myelopathy or radiculopathy, lumbar region: Secondary | ICD-10-CM | POA: Diagnosis not present

## 2022-01-21 DIAGNOSIS — Z6841 Body Mass Index (BMI) 40.0 and over, adult: Secondary | ICD-10-CM | POA: Diagnosis not present

## 2022-01-21 DIAGNOSIS — I130 Hypertensive heart and chronic kidney disease with heart failure and stage 1 through stage 4 chronic kidney disease, or unspecified chronic kidney disease: Secondary | ICD-10-CM | POA: Diagnosis not present

## 2022-01-21 DIAGNOSIS — M4316 Spondylolisthesis, lumbar region: Secondary | ICD-10-CM | POA: Diagnosis not present

## 2022-01-21 DIAGNOSIS — M5441 Lumbago with sciatica, right side: Secondary | ICD-10-CM | POA: Diagnosis not present

## 2022-01-28 ENCOUNTER — Ambulatory Visit
Admit: 2022-01-28 | Discharge: 2022-01-29 | Payer: MEDICARE | Attending: Rehabilitative and Restorative Service Providers" | Primary: Rehabilitative and Restorative Service Providers"

## 2022-01-28 DIAGNOSIS — S22000A Wedge compression fracture of unspecified thoracic vertebra, initial encounter for closed fracture: Secondary | ICD-10-CM | POA: Diagnosis not present

## 2022-01-28 DIAGNOSIS — M179 Osteoarthritis of knee, unspecified: Secondary | ICD-10-CM | POA: Diagnosis not present

## 2022-01-28 DIAGNOSIS — M4310 Spondylolisthesis, site unspecified: Secondary | ICD-10-CM | POA: Diagnosis not present

## 2022-01-28 DIAGNOSIS — M549 Dorsalgia, unspecified: Secondary | ICD-10-CM | POA: Diagnosis not present

## 2022-01-28 DIAGNOSIS — G894 Chronic pain syndrome: Secondary | ICD-10-CM | POA: Diagnosis not present

## 2022-01-28 DIAGNOSIS — M47816 Spondylosis without myelopathy or radiculopathy, lumbar region: Secondary | ICD-10-CM | POA: Diagnosis not present

## 2022-01-28 DIAGNOSIS — Z1389 Encounter for screening for other disorder: Secondary | ICD-10-CM | POA: Diagnosis not present

## 2022-01-30 DIAGNOSIS — J9611 Chronic respiratory failure with hypoxia: Principal | ICD-10-CM

## 2022-01-31 ENCOUNTER — Ambulatory Visit: Admit: 2022-01-31 | Discharge: 2022-02-01 | Payer: MEDICARE | Attending: Family | Primary: Family

## 2022-01-31 DIAGNOSIS — Z794 Long term (current) use of insulin: Principal | ICD-10-CM

## 2022-01-31 DIAGNOSIS — E1165 Type 2 diabetes mellitus with hyperglycemia: Principal | ICD-10-CM

## 2022-01-31 DIAGNOSIS — E785 Hyperlipidemia, unspecified: Secondary | ICD-10-CM | POA: Diagnosis not present

## 2022-01-31 DIAGNOSIS — G4700 Insomnia, unspecified: Secondary | ICD-10-CM | POA: Diagnosis not present

## 2022-01-31 DIAGNOSIS — I10 Essential (primary) hypertension: Secondary | ICD-10-CM | POA: Diagnosis not present

## 2022-01-31 DIAGNOSIS — I2510 Atherosclerotic heart disease of native coronary artery without angina pectoris: Secondary | ICD-10-CM | POA: Diagnosis not present

## 2022-01-31 DIAGNOSIS — Z6841 Body Mass Index (BMI) 40.0 and over, adult: Secondary | ICD-10-CM | POA: Diagnosis not present

## 2022-01-31 MED ORDER — PANTOPRAZOLE 40 MG TABLET,DELAYED RELEASE
ORAL_TABLET | Freq: Every day | ORAL | 1 refills | 90 days | Status: CP
Start: 2022-01-31 — End: ?

## 2022-01-31 MED ORDER — ALBUTEROL SULFATE HFA 90 MCG/ACTUATION AEROSOL INHALER
Freq: Four times a day (QID) | RESPIRATORY_TRACT | 6 refills | 0 days | Status: CP | PRN
Start: 2022-01-31 — End: 2023-01-31

## 2022-01-31 MED ORDER — GLIPIZIDE 10 MG TABLET
ORAL_TABLET | Freq: Every day | ORAL | 0 refills | 90 days | Status: CP
Start: 2022-01-31 — End: ?

## 2022-01-31 MED ORDER — ISOSORBIDE MONONITRATE ER 30 MG TABLET,EXTENDED RELEASE 24 HR
ORAL_TABLET | Freq: Every day | ORAL | 1 refills | 30 days | Status: CP
Start: 2022-01-31 — End: ?

## 2022-01-31 MED ORDER — METOPROLOL TARTRATE 50 MG TABLET
ORAL_TABLET | Freq: Two times a day (BID) | ORAL | 2 refills | 30 days | Status: CP
Start: 2022-01-31 — End: ?

## 2022-02-06 ENCOUNTER — Ambulatory Visit: Admit: 2022-02-06 | Discharge: 2022-02-07 | Payer: MEDICARE

## 2022-02-06 DIAGNOSIS — G6289 Other specified polyneuropathies: Principal | ICD-10-CM

## 2022-02-06 DIAGNOSIS — B351 Tinea unguium: Principal | ICD-10-CM

## 2022-02-06 DIAGNOSIS — L03039 Cellulitis of unspecified toe: Principal | ICD-10-CM

## 2022-02-06 DIAGNOSIS — L089 Local infection of the skin and subcutaneous tissue, unspecified: Principal | ICD-10-CM

## 2022-02-06 DIAGNOSIS — S90412S Abrasion, left great toe, sequela: Principal | ICD-10-CM

## 2022-02-06 DIAGNOSIS — M869 Osteomyelitis, unspecified: Principal | ICD-10-CM

## 2022-02-06 DIAGNOSIS — I5030 Unspecified diastolic (congestive) heart failure: Secondary | ICD-10-CM | POA: Diagnosis not present

## 2022-02-06 DIAGNOSIS — I129 Hypertensive chronic kidney disease with stage 1 through stage 4 chronic kidney disease, or unspecified chronic kidney disease: Secondary | ICD-10-CM | POA: Diagnosis not present

## 2022-02-06 DIAGNOSIS — E1169 Type 2 diabetes mellitus with other specified complication: Secondary | ICD-10-CM | POA: Diagnosis not present

## 2022-02-06 DIAGNOSIS — I2510 Atherosclerotic heart disease of native coronary artery without angina pectoris: Secondary | ICD-10-CM | POA: Diagnosis not present

## 2022-02-06 DIAGNOSIS — Z794 Long term (current) use of insulin: Secondary | ICD-10-CM | POA: Diagnosis not present

## 2022-02-06 DIAGNOSIS — M868X7 Other osteomyelitis, ankle and foot: Secondary | ICD-10-CM | POA: Diagnosis not present

## 2022-02-06 DIAGNOSIS — E1142 Type 2 diabetes mellitus with diabetic polyneuropathy: Secondary | ICD-10-CM | POA: Diagnosis not present

## 2022-02-06 DIAGNOSIS — I739 Peripheral vascular disease, unspecified: Secondary | ICD-10-CM | POA: Diagnosis not present

## 2022-02-06 DIAGNOSIS — S90412D Abrasion, left great toe, subsequent encounter: Secondary | ICD-10-CM | POA: Diagnosis not present

## 2022-02-06 DIAGNOSIS — E1122 Type 2 diabetes mellitus with diabetic chronic kidney disease: Secondary | ICD-10-CM | POA: Diagnosis not present

## 2022-02-06 MED ORDER — BUPROPION HCL XL 300 MG 24 HR TABLET, EXTENDED RELEASE
ORAL_TABLET | 0 refills | 0 days
Start: 2022-02-06 — End: ?

## 2022-02-08 MED ORDER — BUPROPION HCL XL 300 MG 24 HR TABLET, EXTENDED RELEASE
ORAL_TABLET | 0 refills | 0 days | Status: CP
Start: 2022-02-08 — End: ?

## 2022-02-27 ENCOUNTER — Ambulatory Visit: Admit: 2022-02-27 | Discharge: 2022-02-28 | Payer: MEDICARE | Attending: Internal Medicine | Primary: Internal Medicine

## 2022-02-27 DIAGNOSIS — E785 Hyperlipidemia, unspecified: Principal | ICD-10-CM

## 2022-02-27 DIAGNOSIS — I152 Hypertension secondary to endocrine disorders: Principal | ICD-10-CM

## 2022-02-27 DIAGNOSIS — N183 Type 2 diabetes mellitus with stage 3 chronic kidney disease, with long-term current use of insulin, unspecified whether stage 3a or 3b CKD (CMS-HCC): Principal | ICD-10-CM

## 2022-02-27 DIAGNOSIS — E1169 Type 2 diabetes mellitus with other specified complication: Principal | ICD-10-CM

## 2022-02-27 DIAGNOSIS — E1122 Type 2 diabetes mellitus with diabetic chronic kidney disease: Principal | ICD-10-CM

## 2022-02-27 DIAGNOSIS — I259 Chronic ischemic heart disease, unspecified: Principal | ICD-10-CM

## 2022-02-27 DIAGNOSIS — I240 Acute coronary thrombosis not resulting in myocardial infarction: Principal | ICD-10-CM

## 2022-02-27 DIAGNOSIS — I2089 Chronic stable angina: Principal | ICD-10-CM

## 2022-02-27 DIAGNOSIS — E1159 Type 2 diabetes mellitus with other circulatory complications: Principal | ICD-10-CM

## 2022-02-27 DIAGNOSIS — Z794 Long term (current) use of insulin: Principal | ICD-10-CM

## 2022-02-27 MED ORDER — ISOSORBIDE MONONITRATE ER 120 MG TABLET,EXTENDED RELEASE 24 HR
ORAL_TABLET | Freq: Every day | ORAL | 3 refills | 90 days | Status: CP
Start: 2022-02-27 — End: 2023-02-27

## 2022-02-28 ENCOUNTER — Ambulatory Visit: Admit: 2022-02-28 | Discharge: 2022-03-01 | Payer: MEDICARE | Attending: Family | Primary: Family

## 2022-02-28 DIAGNOSIS — M7061 Trochanteric bursitis, right hip: Principal | ICD-10-CM

## 2022-02-28 DIAGNOSIS — M25551 Pain in right hip: Principal | ICD-10-CM

## 2022-03-03 DIAGNOSIS — M169 Osteoarthritis of hip, unspecified: Secondary | ICD-10-CM | POA: Insufficient documentation

## 2022-03-03 HISTORY — DX: Osteoarthritis of hip, unspecified: M16.9

## 2022-03-10 ENCOUNTER — Ambulatory Visit: Admit: 2022-03-10 | Discharge: 2022-03-11 | Payer: MEDICARE

## 2022-03-10 DIAGNOSIS — M25551 Pain in right hip: Principal | ICD-10-CM

## 2022-03-10 DIAGNOSIS — M1611 Unilateral primary osteoarthritis, right hip: Principal | ICD-10-CM

## 2022-03-21 MED ORDER — TORSEMIDE 20 MG TABLET
ORAL_TABLET | Freq: Every day | ORAL | 0 refills | 30 days | Status: CP
Start: 2022-03-21 — End: ?

## 2022-04-05 ENCOUNTER — Other Ambulatory Visit: Payer: Self-pay | Admitting: Internal Medicine

## 2022-04-07 ENCOUNTER — Ambulatory Visit
Admit: 2022-04-07 | Discharge: 2022-04-08 | Payer: MEDICARE | Attending: Student in an Organized Health Care Education/Training Program | Primary: Student in an Organized Health Care Education/Training Program

## 2022-04-07 ENCOUNTER — Ambulatory Visit: Admit: 2022-04-07 | Discharge: 2022-04-08 | Payer: MEDICARE

## 2022-04-07 DIAGNOSIS — M25551 Pain in right hip: Principal | ICD-10-CM

## 2022-04-07 DIAGNOSIS — E785 Hyperlipidemia, unspecified: Principal | ICD-10-CM

## 2022-04-07 DIAGNOSIS — Z5189 Encounter for other specified aftercare: Principal | ICD-10-CM

## 2022-04-07 MED ORDER — CEPHALEXIN 500 MG CAPSULE
ORAL_CAPSULE | Freq: Four times a day (QID) | ORAL | 0 refills | 5 days | Status: CP
Start: 2022-04-07 — End: 2022-04-12

## 2022-04-07 MED ORDER — EZETIMIBE 10 MG TABLET
ORAL_TABLET | Freq: Every day | ORAL | 3 refills | 90 days | Status: CP
Start: 2022-04-07 — End: 2023-04-07

## 2022-04-11 ENCOUNTER — Ambulatory Visit
Admit: 2022-04-11 | Discharge: 2022-04-12 | Payer: MEDICARE | Attending: Student in an Organized Health Care Education/Training Program | Primary: Student in an Organized Health Care Education/Training Program

## 2022-04-11 DIAGNOSIS — Z5189 Encounter for other specified aftercare: Principal | ICD-10-CM

## 2022-04-11 MED ORDER — CEPHALEXIN 500 MG CAPSULE
ORAL_CAPSULE | Freq: Four times a day (QID) | ORAL | 0 refills | 5 days | Status: CP
Start: 2022-04-11 — End: 2022-04-16

## 2022-04-11 MED ORDER — DOXYCYCLINE HYCLATE 100 MG TABLET
ORAL_TABLET | Freq: Two times a day (BID) | ORAL | 0 refills | 5 days | Status: CP
Start: 2022-04-11 — End: 2022-04-16

## 2022-04-14 NOTE — Unmapped (Signed)
Specialty Medication(s): Repatha    Thomas Owens has been dis-enrolled from the [name] specialty pharmacy services due to multiple unsuccessful outreach attempts by the pharmacy.    Additional information provided to the patient: n/a    Camillo Flaming, PharmD  [facility] Specialty Pharmacist

## 2022-04-18 ENCOUNTER — Ambulatory Visit
Admit: 2022-04-18 | Discharge: 2022-04-19 | Payer: MEDICARE | Attending: Student in an Organized Health Care Education/Training Program | Primary: Student in an Organized Health Care Education/Training Program

## 2022-04-18 DIAGNOSIS — Z5189 Encounter for other specified aftercare: Principal | ICD-10-CM

## 2022-04-18 DIAGNOSIS — L0390 Cellulitis, unspecified: Principal | ICD-10-CM

## 2022-04-18 MED ORDER — DOXYCYCLINE HYCLATE 100 MG TABLET
ORAL_TABLET | Freq: Two times a day (BID) | ORAL | 0 refills | 5 days | Status: CP
Start: 2022-04-18 — End: 2022-04-23

## 2022-04-18 MED ORDER — CEPHALEXIN 500 MG CAPSULE
ORAL_CAPSULE | Freq: Four times a day (QID) | ORAL | 0 refills | 5 days | Status: CP
Start: 2022-04-18 — End: 2022-04-23

## 2022-04-28 ENCOUNTER — Ambulatory Visit: Admit: 2022-04-28 | Discharge: 2022-04-29 | Payer: MEDICARE | Attending: Internal Medicine | Primary: Internal Medicine

## 2022-04-28 DIAGNOSIS — I152 Hypertension secondary to endocrine disorders: Principal | ICD-10-CM

## 2022-04-28 DIAGNOSIS — N183 Type 2 diabetes mellitus with stage 3 chronic kidney disease, with long-term current use of insulin, unspecified whether stage 3a or 3b CKD (CMS-HCC): Principal | ICD-10-CM

## 2022-04-28 DIAGNOSIS — E1122 Type 2 diabetes mellitus with diabetic chronic kidney disease: Principal | ICD-10-CM

## 2022-04-28 DIAGNOSIS — I259 Chronic ischemic heart disease, unspecified: Principal | ICD-10-CM

## 2022-04-28 DIAGNOSIS — E785 Hyperlipidemia, unspecified: Principal | ICD-10-CM

## 2022-04-28 DIAGNOSIS — Z794 Long term (current) use of insulin: Principal | ICD-10-CM

## 2022-04-28 DIAGNOSIS — I5032 Chronic diastolic (congestive) heart failure: Principal | ICD-10-CM

## 2022-04-28 DIAGNOSIS — I240 Acute coronary thrombosis not resulting in myocardial infarction: Principal | ICD-10-CM

## 2022-04-28 DIAGNOSIS — E1169 Type 2 diabetes mellitus with other specified complication: Principal | ICD-10-CM

## 2022-04-28 DIAGNOSIS — I2089 Chronic stable angina: Principal | ICD-10-CM

## 2022-04-28 DIAGNOSIS — E1159 Type 2 diabetes mellitus with other circulatory complications: Principal | ICD-10-CM

## 2022-04-28 MED ORDER — NITROGLYCERIN 0.4 MG SUBLINGUAL TABLET
ORAL_TABLET | SUBLINGUAL | 2 refills | 1 days | Status: CP | PRN
Start: 2022-04-28 — End: 2023-04-28

## 2022-04-28 MED ORDER — RANOLAZINE ER 500 MG TABLET,EXTENDED RELEASE,12 HR
ORAL_TABLET | Freq: Two times a day (BID) | ORAL | 6 refills | 30 days | Status: CP
Start: 2022-04-28 — End: 2023-04-28

## 2022-05-01 ENCOUNTER — Ambulatory Visit: Admit: 2022-05-01 | Discharge: 2022-05-02 | Payer: MEDICARE

## 2022-05-01 ENCOUNTER — Ambulatory Visit
Admit: 2022-05-01 | Discharge: 2022-05-02 | Payer: MEDICARE | Attending: Student in an Organized Health Care Education/Training Program | Primary: Student in an Organized Health Care Education/Training Program

## 2022-05-01 DIAGNOSIS — M79641 Pain in right hand: Principal | ICD-10-CM

## 2022-05-01 DIAGNOSIS — M19049 Primary osteoarthritis, unspecified hand: Secondary | ICD-10-CM

## 2022-05-01 HISTORY — DX: Primary osteoarthritis, unspecified hand: M19.049

## 2022-05-01 MED ORDER — TORSEMIDE 20 MG TABLET
ORAL_TABLET | Freq: Every day | ORAL | 3 refills | 90 days | Status: CP
Start: 2022-05-01 — End: 2023-05-01

## 2022-05-01 MED ORDER — PANTOPRAZOLE 40 MG TABLET,DELAYED RELEASE
ORAL_TABLET | Freq: Every day | ORAL | 3 refills | 90 days | Status: CP
Start: 2022-05-01 — End: 2023-05-01

## 2022-05-01 MED ORDER — LISINOPRIL 20 MG TABLET
ORAL_TABLET | Freq: Every day | ORAL | 3 refills | 90 days | Status: CP
Start: 2022-05-01 — End: 2023-05-01

## 2022-05-01 MED ORDER — CLOPIDOGREL 75 MG TABLET
ORAL_TABLET | Freq: Every day | ORAL | 3 refills | 90 days | Status: CP
Start: 2022-05-01 — End: 2023-05-01

## 2022-05-01 MED ORDER — GLIPIZIDE 10 MG TABLET
ORAL_TABLET | Freq: Every day | ORAL | 3 refills | 90 days | Status: CP
Start: 2022-05-01 — End: 2023-05-01

## 2022-05-02 ENCOUNTER — Ambulatory Visit: Admit: 2022-05-02 | Discharge: 2022-05-03 | Payer: MEDICARE | Attending: Vascular Surgery | Primary: Vascular Surgery

## 2022-05-02 DIAGNOSIS — Z5189 Encounter for other specified aftercare: Principal | ICD-10-CM

## 2022-05-02 DIAGNOSIS — T148XXA Other injury of unspecified body region, initial encounter: Principal | ICD-10-CM

## 2022-05-02 DIAGNOSIS — L0390 Cellulitis, unspecified: Principal | ICD-10-CM

## 2022-05-09 DIAGNOSIS — M19041 Primary osteoarthritis, right hand: Principal | ICD-10-CM

## 2022-05-14 ENCOUNTER — Ambulatory Visit: Admit: 2022-05-14 | Discharge: 2022-05-15 | Payer: MEDICARE

## 2022-05-14 DIAGNOSIS — L97921 Non-pressure chronic ulcer of unspecified part of left lower leg limited to breakdown of skin: Principal | ICD-10-CM

## 2022-05-29 ENCOUNTER — Ambulatory Visit: Admit: 2022-05-29 | Discharge: 2022-05-30 | Payer: MEDICARE | Attending: Internal Medicine | Primary: Internal Medicine

## 2022-05-29 DIAGNOSIS — E1169 Type 2 diabetes mellitus with other specified complication: Principal | ICD-10-CM

## 2022-05-29 DIAGNOSIS — I152 Hypertension secondary to endocrine disorders: Principal | ICD-10-CM

## 2022-05-29 DIAGNOSIS — I259 Chronic ischemic heart disease, unspecified: Principal | ICD-10-CM

## 2022-05-29 DIAGNOSIS — I5032 Chronic diastolic (congestive) heart failure: Principal | ICD-10-CM

## 2022-05-29 DIAGNOSIS — N183 Type 2 diabetes mellitus with stage 3 chronic kidney disease, with long-term current use of insulin, unspecified whether stage 3a or 3b CKD (CMS-HCC): Principal | ICD-10-CM

## 2022-05-29 DIAGNOSIS — E785 Hyperlipidemia, unspecified: Principal | ICD-10-CM

## 2022-05-29 DIAGNOSIS — I240 Acute coronary thrombosis not resulting in myocardial infarction: Principal | ICD-10-CM

## 2022-05-29 DIAGNOSIS — E1159 Type 2 diabetes mellitus with other circulatory complications: Principal | ICD-10-CM

## 2022-05-29 DIAGNOSIS — Z794 Long term (current) use of insulin: Principal | ICD-10-CM

## 2022-05-29 DIAGNOSIS — E1122 Type 2 diabetes mellitus with diabetic chronic kidney disease: Principal | ICD-10-CM

## 2022-05-29 DIAGNOSIS — I2089 Chronic stable angina (CMS-HCC): Principal | ICD-10-CM

## 2022-06-03 MED ORDER — METOPROLOL TARTRATE 50 MG TABLET
ORAL_TABLET | Freq: Two times a day (BID) | ORAL | 3 refills | 90 days | Status: CP
Start: 2022-06-03 — End: 2023-06-03

## 2022-06-03 MED ORDER — BUPROPION HCL XL 300 MG 24 HR TABLET, EXTENDED RELEASE
ORAL_TABLET | Freq: Every morning | ORAL | 3 refills | 90 days | Status: CP
Start: 2022-06-03 — End: 2023-06-03

## 2022-06-09 ENCOUNTER — Ambulatory Visit: Admit: 2022-06-09 | Discharge: 2022-06-09 | Payer: MEDICARE

## 2022-06-09 DIAGNOSIS — M79671 Pain in right foot: Principal | ICD-10-CM

## 2022-06-09 DIAGNOSIS — I8391 Asymptomatic varicose veins of right lower extremity: Principal | ICD-10-CM

## 2022-06-10 ENCOUNTER — Ambulatory Visit: Admit: 2022-06-10 | Discharge: 2022-06-11 | Payer: MEDICARE

## 2022-06-10 ENCOUNTER — Ambulatory Visit
Admit: 2022-06-10 | Discharge: 2022-06-11 | Payer: MEDICARE | Attending: Student in an Organized Health Care Education/Training Program | Primary: Student in an Organized Health Care Education/Training Program

## 2022-06-10 DIAGNOSIS — N183 Type 2 diabetes mellitus with stage 3 chronic kidney disease, with long-term current use of insulin, unspecified whether stage 3a or 3b CKD (CMS-HCC): Principal | ICD-10-CM

## 2022-06-10 DIAGNOSIS — J449 Chronic obstructive pulmonary disease, unspecified: Principal | ICD-10-CM

## 2022-06-10 DIAGNOSIS — Z6841 Body Mass Index (BMI) 40.0 and over, adult: Principal | ICD-10-CM

## 2022-06-10 DIAGNOSIS — Z794 Long term (current) use of insulin: Principal | ICD-10-CM

## 2022-06-10 DIAGNOSIS — E1122 Type 2 diabetes mellitus with diabetic chronic kidney disease: Principal | ICD-10-CM

## 2022-06-10 DIAGNOSIS — R5383 Other fatigue: Principal | ICD-10-CM

## 2022-06-10 DIAGNOSIS — I259 Chronic ischemic heart disease, unspecified: Secondary | ICD-10-CM | POA: Insufficient documentation

## 2022-06-10 HISTORY — DX: Chronic ischemic heart disease, unspecified: I25.9

## 2022-06-10 MED ORDER — GLIPIZIDE 10 MG TABLET
ORAL_TABLET | Freq: Two times a day (BID) | ORAL | 11 refills | 30.00000 days | Status: CP
Start: 2022-06-10 — End: 2023-06-10

## 2022-06-12 ENCOUNTER — Ambulatory Visit: Admit: 2022-06-12 | Discharge: 2022-06-13 | Payer: MEDICARE

## 2022-06-12 MED ORDER — FLUTICASONE 250 MCG-SALMETEROL 50 MCG/DOSE BLISTR POWDR FOR INHALATION
Freq: Two times a day (BID) | RESPIRATORY_TRACT | 11 refills | 30 days | Status: CP
Start: 2022-06-12 — End: 2023-06-12

## 2022-06-25 DIAGNOSIS — G894 Chronic pain syndrome: Secondary | ICD-10-CM | POA: Insufficient documentation

## 2022-06-25 HISTORY — DX: Chronic pain syndrome: G89.4

## 2022-06-26 ENCOUNTER — Ambulatory Visit: Admit: 2022-06-26 | Discharge: 2022-06-27 | Payer: MEDICARE

## 2022-06-27 ENCOUNTER — Ambulatory Visit: Admit: 2022-06-27 | Discharge: 2022-06-28 | Payer: MEDICARE | Attending: Internal Medicine | Primary: Internal Medicine

## 2022-06-27 DIAGNOSIS — E1169 Type 2 diabetes mellitus with other specified complication: Principal | ICD-10-CM

## 2022-06-27 DIAGNOSIS — E1159 Type 2 diabetes mellitus with other circulatory complications: Principal | ICD-10-CM

## 2022-06-27 DIAGNOSIS — I209 Angina pectoris, unspecified: Principal | ICD-10-CM

## 2022-06-27 DIAGNOSIS — I259 Chronic ischemic heart disease, unspecified: Principal | ICD-10-CM

## 2022-06-27 DIAGNOSIS — E1122 Type 2 diabetes mellitus with diabetic chronic kidney disease: Principal | ICD-10-CM

## 2022-06-27 DIAGNOSIS — N183 Type 2 diabetes mellitus with stage 3 chronic kidney disease, with long-term current use of insulin, unspecified whether stage 3a or 3b CKD (CMS-HCC): Principal | ICD-10-CM

## 2022-06-27 DIAGNOSIS — E785 Hyperlipidemia, unspecified: Principal | ICD-10-CM

## 2022-06-27 DIAGNOSIS — I5032 Chronic diastolic (congestive) heart failure: Principal | ICD-10-CM

## 2022-06-27 DIAGNOSIS — I240 Acute coronary thrombosis not resulting in myocardial infarction: Principal | ICD-10-CM

## 2022-06-27 DIAGNOSIS — I152 Hypertension secondary to endocrine disorders: Principal | ICD-10-CM

## 2022-06-27 DIAGNOSIS — Z794 Long term (current) use of insulin: Principal | ICD-10-CM

## 2022-06-30 ENCOUNTER — Ambulatory Visit: Admit: 2022-06-30 | Discharge: 2022-07-01 | Payer: MEDICARE

## 2022-06-30 DIAGNOSIS — I240 Acute coronary thrombosis not resulting in myocardial infarction: Principal | ICD-10-CM

## 2022-06-30 DIAGNOSIS — I209 Angina pectoris, unspecified: Principal | ICD-10-CM

## 2022-06-30 DIAGNOSIS — E785 Hyperlipidemia, unspecified: Principal | ICD-10-CM

## 2022-06-30 DIAGNOSIS — E1122 Type 2 diabetes mellitus with diabetic chronic kidney disease: Principal | ICD-10-CM

## 2022-06-30 DIAGNOSIS — I5032 Chronic diastolic (congestive) heart failure: Principal | ICD-10-CM

## 2022-06-30 DIAGNOSIS — N183 Type 2 diabetes mellitus with stage 3 chronic kidney disease, with long-term current use of insulin, unspecified whether stage 3a or 3b CKD (CMS-HCC): Principal | ICD-10-CM

## 2022-06-30 DIAGNOSIS — E1169 Type 2 diabetes mellitus with other specified complication: Principal | ICD-10-CM

## 2022-06-30 DIAGNOSIS — I259 Chronic ischemic heart disease, unspecified: Principal | ICD-10-CM

## 2022-06-30 DIAGNOSIS — E1159 Type 2 diabetes mellitus with other circulatory complications: Principal | ICD-10-CM

## 2022-06-30 DIAGNOSIS — Z794 Long term (current) use of insulin: Principal | ICD-10-CM

## 2022-06-30 DIAGNOSIS — I152 Hypertension secondary to endocrine disorders: Principal | ICD-10-CM

## 2022-07-01 ENCOUNTER — Ambulatory Visit
Admit: 2022-07-01 | Discharge: 2022-07-02 | Payer: MEDICARE | Attending: Student in an Organized Health Care Education/Training Program | Primary: Student in an Organized Health Care Education/Training Program

## 2022-07-01 DIAGNOSIS — N183 Type 2 diabetes mellitus with stage 3 chronic kidney disease, with long-term current use of insulin, unspecified whether stage 3a or 3b CKD (CMS-HCC): Principal | ICD-10-CM

## 2022-07-01 DIAGNOSIS — I240 Acute coronary thrombosis not resulting in myocardial infarction: Principal | ICD-10-CM

## 2022-07-01 DIAGNOSIS — G4733 Obstructive sleep apnea (adult) (pediatric): Principal | ICD-10-CM

## 2022-07-01 DIAGNOSIS — R5383 Other fatigue: Principal | ICD-10-CM

## 2022-07-01 DIAGNOSIS — I259 Chronic ischemic heart disease, unspecified: Principal | ICD-10-CM

## 2022-07-01 DIAGNOSIS — Z6841 Body Mass Index (BMI) 40.0 and over, adult: Principal | ICD-10-CM

## 2022-07-01 DIAGNOSIS — Z794 Long term (current) use of insulin: Principal | ICD-10-CM

## 2022-07-01 DIAGNOSIS — J449 Chronic obstructive pulmonary disease, unspecified: Principal | ICD-10-CM

## 2022-07-01 DIAGNOSIS — E1122 Type 2 diabetes mellitus with diabetic chronic kidney disease: Principal | ICD-10-CM

## 2022-07-07 ENCOUNTER — Ambulatory Visit: Admit: 2022-07-07 | Discharge: 2022-07-07 | Payer: MEDICARE

## 2022-07-08 DIAGNOSIS — R0902 Hypoxemia: Principal | ICD-10-CM

## 2022-07-09 ENCOUNTER — Emergency Department: Admit: 2022-07-09 | Discharge: 2022-07-09 | Disposition: A | Discharge status: Home / Self Care | Payer: MEDICARE

## 2022-07-09 ENCOUNTER — Ambulatory Visit: Admit: 2022-07-09 | Discharge: 2022-07-09 | Disposition: A | Discharge status: Home / Self Care | Payer: MEDICARE

## 2022-07-09 DIAGNOSIS — R079 Chest pain, unspecified: Principal | ICD-10-CM

## 2022-07-09 DIAGNOSIS — N179 Acute kidney failure, unspecified: Principal | ICD-10-CM

## 2022-07-09 DIAGNOSIS — E860 Dehydration: Principal | ICD-10-CM

## 2022-07-09 DIAGNOSIS — K219 Gastro-esophageal reflux disease without esophagitis: Principal | ICD-10-CM

## 2022-07-09 MED ORDER — PANTOPRAZOLE 40 MG TABLET,DELAYED RELEASE
ORAL_TABLET | Freq: Two times a day (BID) | ORAL | 0 refills | 30 days | Status: CP
Start: 2022-07-09 — End: ?

## 2022-07-15 ENCOUNTER — Ambulatory Visit
Admit: 2022-07-15 | Discharge: 2022-07-16 | Payer: MEDICARE | Attending: Student in an Organized Health Care Education/Training Program | Primary: Student in an Organized Health Care Education/Training Program

## 2022-07-15 DIAGNOSIS — I259 Chronic ischemic heart disease, unspecified: Principal | ICD-10-CM

## 2022-07-15 DIAGNOSIS — I240 Acute coronary thrombosis not resulting in myocardial infarction: Principal | ICD-10-CM

## 2022-07-15 DIAGNOSIS — E1122 Type 2 diabetes mellitus with diabetic chronic kidney disease: Principal | ICD-10-CM

## 2022-07-15 DIAGNOSIS — N183 Type 2 diabetes mellitus with stage 3 chronic kidney disease, with long-term current use of insulin, unspecified whether stage 3a or 3b CKD (CMS-HCC): Principal | ICD-10-CM

## 2022-07-15 DIAGNOSIS — K219 Gastro-esophageal reflux disease without esophagitis: Principal | ICD-10-CM

## 2022-07-15 DIAGNOSIS — G4733 Obstructive sleep apnea (adult) (pediatric): Principal | ICD-10-CM

## 2022-07-15 DIAGNOSIS — J449 Chronic obstructive pulmonary disease, unspecified: Principal | ICD-10-CM

## 2022-07-15 DIAGNOSIS — Z794 Long term (current) use of insulin: Principal | ICD-10-CM

## 2022-07-16 ENCOUNTER — Institutional Professional Consult (permissible substitution)
Admit: 2022-07-16 | Discharge: 2022-07-17 | Payer: MEDICARE | Attending: Student in an Organized Health Care Education/Training Program | Primary: Student in an Organized Health Care Education/Training Program

## 2022-08-01 ENCOUNTER — Ambulatory Visit
Admit: 2022-08-01 | Discharge: 2022-08-02 | Payer: MEDICARE | Attending: Student in an Organized Health Care Education/Training Program | Primary: Student in an Organized Health Care Education/Training Program

## 2022-08-01 DIAGNOSIS — Z9181 History of falling: Secondary | ICD-10-CM | POA: Insufficient documentation

## 2022-08-01 MED ORDER — FLUTICASONE 250 MCG-SALMETEROL 50 MCG/DOSE BLISTR POWDR FOR INHALATION
Freq: Two times a day (BID) | RESPIRATORY_TRACT | 11 refills | 30 days | Status: CP
Start: 2022-08-01 — End: 2023-08-01

## 2022-08-01 MED ORDER — HUMALOG KWIKPEN (U-100) INSULIN 100 UNIT/ML SUBCUTANEOUS
3 refills | 0 days
Start: 2022-08-01 — End: ?

## 2022-08-01 MED ORDER — INSULIN LISPRO (U-100) 100 UNIT/ML SUBCUTANEOUS SOLUTION
Freq: Three times a day (TID) | SUBCUTANEOUS | 12 refills | 0 days | Status: CN
Start: 2022-08-01 — End: 2023-08-01

## 2022-08-01 MED ORDER — INSULIN ASPART (U-100) 100 UNIT/ML (3 ML) SUBCUTANEOUS PEN
3 refills | 0 days | Status: CP
Start: 2022-08-01 — End: ?

## 2022-08-01 MED ORDER — OZEMPIC 0.25 MG OR 0.5 MG (2 MG/3 ML) SUBCUTANEOUS PEN INJECTOR
SUBCUTANEOUS | 0 refills | 56 days | Status: CP
Start: 2022-08-01 — End: 2022-09-26

## 2022-08-01 MED ORDER — INSULIN LISPRO (U-100) 100 UNIT/ML SUBCUTANEOUS PEN
3 refills | 0 days | Status: CP
Start: 2022-08-01 — End: ?

## 2022-08-08 DIAGNOSIS — E1122 Type 2 diabetes mellitus with diabetic chronic kidney disease: Principal | ICD-10-CM

## 2022-08-08 DIAGNOSIS — N183 Type 2 diabetes mellitus with stage 3 chronic kidney disease, with long-term current use of insulin, unspecified whether stage 3a or 3b CKD (CMS-HCC): Principal | ICD-10-CM

## 2022-08-08 DIAGNOSIS — J449 Chronic obstructive pulmonary disease, unspecified: Principal | ICD-10-CM

## 2022-08-08 DIAGNOSIS — Z794 Long term (current) use of insulin: Principal | ICD-10-CM

## 2022-08-08 MED ORDER — INSULIN ASPART (U-100) 100 UNIT/ML (3 ML) SUBCUTANEOUS PEN
3 refills | 0 days | Status: CP
Start: 2022-08-08 — End: ?

## 2022-08-08 MED ORDER — FLUTICASONE 500 MCG-SALMETEROL 50 MCG/DOSE BLISTR POWDR FOR INHALATION
Freq: Two times a day (BID) | RESPIRATORY_TRACT | 11 refills | 30 days | Status: CP
Start: 2022-08-08 — End: 2023-08-08

## 2022-08-14 ENCOUNTER — Ambulatory Visit: Admit: 2022-08-14 | Discharge: 2022-08-15 | Payer: MEDICARE | Attending: Internal Medicine | Primary: Internal Medicine

## 2022-08-14 DIAGNOSIS — I5032 Chronic diastolic (congestive) heart failure: Principal | ICD-10-CM

## 2022-08-14 DIAGNOSIS — I152 Hypertension secondary to endocrine disorders: Principal | ICD-10-CM

## 2022-08-14 DIAGNOSIS — J449 Chronic obstructive pulmonary disease, unspecified: Principal | ICD-10-CM

## 2022-08-14 DIAGNOSIS — I259 Chronic ischemic heart disease, unspecified: Principal | ICD-10-CM

## 2022-08-14 DIAGNOSIS — E1169 Type 2 diabetes mellitus with other specified complication: Principal | ICD-10-CM

## 2022-08-14 DIAGNOSIS — E1122 Type 2 diabetes mellitus with diabetic chronic kidney disease: Principal | ICD-10-CM

## 2022-08-14 DIAGNOSIS — Z794 Long term (current) use of insulin: Principal | ICD-10-CM

## 2022-08-14 DIAGNOSIS — E785 Hyperlipidemia, unspecified: Principal | ICD-10-CM

## 2022-08-14 DIAGNOSIS — I240 Acute coronary thrombosis not resulting in myocardial infarction: Principal | ICD-10-CM

## 2022-08-14 DIAGNOSIS — N183 Type 2 diabetes mellitus with stage 3 chronic kidney disease, with long-term current use of insulin, unspecified whether stage 3a or 3b CKD (CMS-HCC): Principal | ICD-10-CM

## 2022-08-14 DIAGNOSIS — E1159 Type 2 diabetes mellitus with other circulatory complications: Principal | ICD-10-CM

## 2022-08-14 DIAGNOSIS — M25561 Pain in right knee: Principal | ICD-10-CM

## 2022-08-14 DIAGNOSIS — I2089 Chronic stable angina (CMS-HCC): Principal | ICD-10-CM

## 2022-08-15 ENCOUNTER — Ambulatory Visit
Admit: 2022-08-15 | Discharge: 2022-08-16 | Payer: MEDICARE | Attending: Student in an Organized Health Care Education/Training Program | Primary: Student in an Organized Health Care Education/Training Program

## 2022-08-15 DIAGNOSIS — I200 Unstable angina: Principal | ICD-10-CM

## 2022-08-15 DIAGNOSIS — I240 Acute coronary thrombosis not resulting in myocardial infarction: Principal | ICD-10-CM

## 2022-08-15 DIAGNOSIS — N183 Type 2 diabetes mellitus with stage 3 chronic kidney disease, with long-term current use of insulin, unspecified whether stage 3a or 3b CKD (CMS-HCC): Principal | ICD-10-CM

## 2022-08-15 DIAGNOSIS — E1122 Type 2 diabetes mellitus with diabetic chronic kidney disease: Principal | ICD-10-CM

## 2022-08-15 DIAGNOSIS — Z794 Long term (current) use of insulin: Principal | ICD-10-CM

## 2022-08-15 DIAGNOSIS — I259 Chronic ischemic heart disease, unspecified: Principal | ICD-10-CM

## 2022-08-15 MED ORDER — INSULIN SYRINGE U-100 WITH NEEDLE 1/2 ML 31 GAUGE X 15/64" (6 MM)
1 refills | 0 days | Status: CP
Start: 2022-08-15 — End: 2023-08-15

## 2022-08-15 MED ORDER — INSULIN ASPART U-100  100 UNIT/ML SUBCUTANEOUS SOLUTION
Freq: Two times a day (BID) | SUBCUTANEOUS | 3 refills | 90 days | Status: CP
Start: 2022-08-15 — End: ?

## 2022-08-15 MED ORDER — DICYCLOMINE 10 MG CAPSULE
ORAL_CAPSULE | Freq: Four times a day (QID) | ORAL | 0 refills | 8 days | Status: CP
Start: 2022-08-15 — End: 2022-09-14

## 2022-08-15 MED ORDER — BD INSULIN SYRINGE U-500 1/2 ML 31 GAUGE X 15/64" (6 MM)
11 refills | 0 days | Status: CP
Start: 2022-08-15 — End: 2022-08-15

## 2022-08-18 ENCOUNTER — Ambulatory Visit: Admit: 2022-08-18 | Discharge: 2022-08-19 | Payer: MEDICARE

## 2022-08-22 ENCOUNTER — Ambulatory Visit
Admit: 2022-08-22 | Discharge: 2022-08-23 | Payer: MEDICARE | Attending: Student in an Organized Health Care Education/Training Program | Primary: Student in an Organized Health Care Education/Training Program

## 2022-08-22 DIAGNOSIS — N183 Type 2 diabetes mellitus with stage 3 chronic kidney disease, with long-term current use of insulin, unspecified whether stage 3a or 3b CKD (CMS-HCC): Principal | ICD-10-CM

## 2022-08-22 DIAGNOSIS — R0902 Hypoxemia: Principal | ICD-10-CM

## 2022-08-22 DIAGNOSIS — E1122 Type 2 diabetes mellitus with diabetic chronic kidney disease: Principal | ICD-10-CM

## 2022-08-22 DIAGNOSIS — Z794 Long term (current) use of insulin: Principal | ICD-10-CM

## 2022-09-04 ENCOUNTER — Ambulatory Visit
Admit: 2022-09-04 | Discharge: 2022-09-04 | Disposition: A | Discharge status: Home / Self Care | Payer: MEDICARE | Attending: Family Medicine

## 2022-09-04 DIAGNOSIS — T148XXA Other injury of unspecified body region, initial encounter: Principal | ICD-10-CM

## 2022-09-04 DIAGNOSIS — Z9889 Other specified postprocedural states: Principal | ICD-10-CM

## 2022-09-15 ENCOUNTER — Ambulatory Visit: Admit: 2022-09-15 | Discharge: 2022-09-16 | Payer: MEDICARE

## 2022-09-15 DIAGNOSIS — M1711 Unilateral primary osteoarthritis, right knee: Principal | ICD-10-CM

## 2022-09-16 ENCOUNTER — Ambulatory Visit
Admit: 2022-09-16 | Discharge: 2022-09-17 | Payer: MEDICARE | Attending: Student in an Organized Health Care Education/Training Program | Primary: Student in an Organized Health Care Education/Training Program

## 2022-09-16 DIAGNOSIS — Z794 Long term (current) use of insulin: Principal | ICD-10-CM

## 2022-09-16 DIAGNOSIS — R399 Unspecified symptoms and signs involving the genitourinary system: Principal | ICD-10-CM

## 2022-09-16 DIAGNOSIS — N183 Type 2 diabetes mellitus with stage 3 chronic kidney disease, with long-term current use of insulin, unspecified whether stage 3a or 3b CKD (CMS-HCC): Principal | ICD-10-CM

## 2022-09-16 DIAGNOSIS — E1122 Type 2 diabetes mellitus with diabetic chronic kidney disease: Principal | ICD-10-CM

## 2022-09-16 MED ORDER — HYOSCYAMINE 0.125 MG SUBLINGUAL TABLET
ORAL_TABLET | Freq: Four times a day (QID) | SUBLINGUAL | 0 refills | 10 days | Status: CP | PRN
Start: 2022-09-16 — End: 2022-09-26

## 2022-09-16 MED ORDER — DICYCLOMINE 10 MG CAPSULE
ORAL_CAPSULE | Freq: Four times a day (QID) | ORAL | 0 refills | 8 days | Status: CP | PRN
Start: 2022-09-16 — End: 2022-09-16

## 2022-09-19 ENCOUNTER — Encounter: Admit: 2022-09-19 | Discharge: 2022-09-19 | Payer: MEDICARE

## 2022-09-19 ENCOUNTER — Ambulatory Visit: Admit: 2022-09-19 | Discharge: 2022-09-19 | Payer: MEDICARE

## 2022-09-19 DIAGNOSIS — R0902 Hypoxemia: Principal | ICD-10-CM

## 2022-09-19 DIAGNOSIS — E662 Morbid (severe) obesity with alveolar hypoventilation: Principal | ICD-10-CM

## 2022-09-19 DIAGNOSIS — R0600 Dyspnea, unspecified: Principal | ICD-10-CM

## 2022-09-19 DIAGNOSIS — G4733 Obstructive sleep apnea (adult) (pediatric): Principal | ICD-10-CM

## 2022-09-19 DIAGNOSIS — J984 Other disorders of lung: Principal | ICD-10-CM

## 2022-09-19 DIAGNOSIS — I5032 Chronic diastolic (congestive) heart failure: Principal | ICD-10-CM

## 2022-09-26 MED ORDER — OZEMPIC 1 MG/DOSE (4 MG/3 ML) SUBCUTANEOUS PEN INJECTOR
SUBCUTANEOUS | 0 refills | 28 days | Status: CP
Start: 2022-09-26 — End: 2022-10-18

## 2022-09-26 MED ORDER — EMPAGLIFLOZIN 10 MG TABLET
ORAL_TABLET | Freq: Every day | ORAL | 11 refills | 30 days | Status: CP
Start: 2022-09-26 — End: 2023-09-26

## 2022-09-29 ENCOUNTER — Ambulatory Visit: Admit: 2022-09-29 | Discharge: 2022-09-30 | Payer: MEDICARE

## 2022-09-29 DIAGNOSIS — M1711 Unilateral primary osteoarthritis, right knee: Principal | ICD-10-CM

## 2022-09-30 ENCOUNTER — Ambulatory Visit: Admit: 2022-09-30 | Discharge: 2022-10-01 | Payer: MEDICARE | Attending: Internal Medicine | Primary: Internal Medicine

## 2022-09-30 DIAGNOSIS — E1122 Type 2 diabetes mellitus with diabetic chronic kidney disease: Principal | ICD-10-CM

## 2022-09-30 DIAGNOSIS — E785 Hyperlipidemia, unspecified: Principal | ICD-10-CM

## 2022-09-30 DIAGNOSIS — N183 Type 2 diabetes mellitus with stage 3 chronic kidney disease, with long-term current use of insulin, unspecified whether stage 3a or 3b CKD (CMS-HCC): Principal | ICD-10-CM

## 2022-09-30 DIAGNOSIS — E1169 Type 2 diabetes mellitus with other specified complication: Principal | ICD-10-CM

## 2022-09-30 DIAGNOSIS — J9611 Chronic respiratory failure with hypoxia: Principal | ICD-10-CM

## 2022-09-30 DIAGNOSIS — Z9861 Coronary angioplasty status: Principal | ICD-10-CM

## 2022-09-30 DIAGNOSIS — I152 Hypertension secondary to endocrine disorders: Principal | ICD-10-CM

## 2022-09-30 DIAGNOSIS — I259 Chronic ischemic heart disease, unspecified: Principal | ICD-10-CM

## 2022-09-30 DIAGNOSIS — Z794 Long term (current) use of insulin: Principal | ICD-10-CM

## 2022-09-30 DIAGNOSIS — I2089 Chronic stable angina (CMS-HCC): Principal | ICD-10-CM

## 2022-09-30 DIAGNOSIS — I2510 Atherosclerotic heart disease of native coronary artery without angina pectoris: Principal | ICD-10-CM

## 2022-09-30 DIAGNOSIS — I240 Acute coronary thrombosis not resulting in myocardial infarction: Principal | ICD-10-CM

## 2022-09-30 DIAGNOSIS — E1159 Type 2 diabetes mellitus with other circulatory complications: Principal | ICD-10-CM

## 2022-09-30 DIAGNOSIS — I5032 Chronic diastolic (congestive) heart failure: Principal | ICD-10-CM

## 2022-09-30 MED ORDER — NITROGLYCERIN 0.4 MG SUBLINGUAL TABLET
ORAL_TABLET | SUBLINGUAL | 2 refills | 1 days | Status: CP | PRN
Start: 2022-09-30 — End: 2023-09-30

## 2022-10-08 ENCOUNTER — Ambulatory Visit: Admit: 2022-10-08 | Discharge: 2022-10-08 | Payer: MEDICARE

## 2022-10-08 ENCOUNTER — Ambulatory Visit
Admit: 2022-10-08 | Discharge: 2022-10-08 | Payer: MEDICARE | Attending: Student in an Organized Health Care Education/Training Program | Primary: Student in an Organized Health Care Education/Training Program

## 2022-10-08 DIAGNOSIS — I10 Essential (primary) hypertension: Principal | ICD-10-CM

## 2022-10-08 DIAGNOSIS — Z6841 Body Mass Index (BMI) 40.0 and over, adult: Principal | ICD-10-CM

## 2022-10-08 DIAGNOSIS — N183 Stage 3 chronic kidney disease, unspecified whether stage 3a or 3b CKD (CMS-HCC): Principal | ICD-10-CM

## 2022-10-10 ENCOUNTER — Ambulatory Visit
Admit: 2022-10-10 | Discharge: 2022-10-11 | Payer: MEDICARE | Attending: Student in an Organized Health Care Education/Training Program | Primary: Student in an Organized Health Care Education/Training Program

## 2022-10-10 DIAGNOSIS — Z794 Long term (current) use of insulin: Principal | ICD-10-CM

## 2022-10-10 DIAGNOSIS — N183 Type 2 diabetes mellitus with stage 3 chronic kidney disease, with long-term current use of insulin, unspecified whether stage 3a or 3b CKD (CMS-HCC): Principal | ICD-10-CM

## 2022-10-10 DIAGNOSIS — R140 Abdominal distension (gaseous): Principal | ICD-10-CM

## 2022-10-10 DIAGNOSIS — E1122 Type 2 diabetes mellitus with diabetic chronic kidney disease: Principal | ICD-10-CM

## 2022-10-18 MED ORDER — OZEMPIC 2 MG/DOSE (8 MG/3 ML) SUBCUTANEOUS PEN INJECTOR
SUBCUTANEOUS | 3 refills | 84 days | Status: CP
Start: 2022-10-18 — End: 2023-10-18

## 2022-11-04 ENCOUNTER — Ambulatory Visit: Admit: 2022-11-04 | Discharge: 2022-11-05 | Payer: MEDICARE

## 2022-11-04 DIAGNOSIS — M1711 Unilateral primary osteoarthritis, right knee: Principal | ICD-10-CM

## 2022-11-11 DIAGNOSIS — R1033 Periumbilical pain: Principal | ICD-10-CM

## 2022-11-11 DIAGNOSIS — R140 Abdominal distension (gaseous): Principal | ICD-10-CM

## 2022-11-11 DIAGNOSIS — Z8719 Personal history of other diseases of the digestive system: Principal | ICD-10-CM

## 2022-11-11 DIAGNOSIS — Z9889 Other specified postprocedural states: Principal | ICD-10-CM

## 2022-11-12 ENCOUNTER — Ambulatory Visit: Admit: 2022-11-12 | Discharge: 2022-11-13 | Payer: MEDICARE

## 2022-11-17 ENCOUNTER — Ambulatory Visit
Admit: 2022-11-17 | Discharge: 2022-11-18 | Payer: MEDICARE | Attending: Student in an Organized Health Care Education/Training Program | Primary: Student in an Organized Health Care Education/Training Program

## 2022-11-17 DIAGNOSIS — E291 Testicular hypofunction: Principal | ICD-10-CM

## 2022-11-17 DIAGNOSIS — N183 Type 2 diabetes mellitus with stage 3 chronic kidney disease, with long-term current use of insulin, unspecified whether stage 3a or 3b CKD (CMS-HCC): Principal | ICD-10-CM

## 2022-11-17 DIAGNOSIS — E1122 Type 2 diabetes mellitus with diabetic chronic kidney disease: Principal | ICD-10-CM

## 2022-11-17 DIAGNOSIS — N529 Male erectile dysfunction, unspecified: Principal | ICD-10-CM

## 2022-11-17 DIAGNOSIS — Z794 Long term (current) use of insulin: Principal | ICD-10-CM

## 2022-11-17 DIAGNOSIS — Z23 Encounter for immunization: Principal | ICD-10-CM

## 2022-11-17 DIAGNOSIS — N400 Enlarged prostate without lower urinary tract symptoms: Principal | ICD-10-CM

## 2022-11-17 HISTORY — DX: Benign prostatic hyperplasia without lower urinary tract symptoms: N40.0

## 2022-11-17 HISTORY — DX: Testicular hypofunction: E29.1

## 2022-11-17 HISTORY — DX: Male erectile dysfunction, unspecified: N52.9

## 2022-11-23 MED ORDER — RANOLAZINE ER 500 MG TABLET,EXTENDED RELEASE,12 HR
ORAL_TABLET | ORAL | 0 refills | 0 days
Start: 2022-11-23 — End: ?

## 2022-11-26 MED ORDER — RANOLAZINE ER 500 MG TABLET,EXTENDED RELEASE,12 HR
ORAL_TABLET | ORAL | 11 refills | 0 days | Status: CP
Start: 2022-11-26 — End: ?

## 2022-12-15 ENCOUNTER — Ambulatory Visit: Admit: 2022-12-15 | Discharge: 2022-12-15 | Disposition: A | Discharge status: Home / Self Care | Payer: MEDICARE

## 2022-12-15 ENCOUNTER — Emergency Department: Admit: 2022-12-15 | Discharge: 2022-12-15 | Disposition: A | Discharge status: Home / Self Care

## 2022-12-15 DIAGNOSIS — R1031 Right lower quadrant pain: Principal | ICD-10-CM

## 2022-12-15 DIAGNOSIS — N2889 Other specified disorders of kidney and ureter: Principal | ICD-10-CM

## 2022-12-16 ENCOUNTER — Ambulatory Visit
Admit: 2022-12-16 | Attending: Student in an Organized Health Care Education/Training Program | Primary: Student in an Organized Health Care Education/Training Program

## 2022-12-22 ENCOUNTER — Ambulatory Visit: Admit: 2022-12-22 | Discharge: 2022-12-23

## 2022-12-22 ENCOUNTER — Ambulatory Visit
Admit: 2022-12-22 | Discharge: 2022-12-23 | Attending: Student in an Organized Health Care Education/Training Program | Primary: Student in an Organized Health Care Education/Training Program

## 2022-12-22 DIAGNOSIS — R0902 Hypoxemia: Principal | ICD-10-CM

## 2022-12-22 DIAGNOSIS — N2889 Other specified disorders of kidney and ureter: Principal | ICD-10-CM

## 2022-12-22 DIAGNOSIS — J984 Other disorders of lung: Principal | ICD-10-CM

## 2022-12-22 DIAGNOSIS — G4733 Obstructive sleep apnea (adult) (pediatric): Principal | ICD-10-CM

## 2022-12-22 DIAGNOSIS — J9611 Chronic respiratory failure with hypoxia: Principal | ICD-10-CM

## 2022-12-22 DIAGNOSIS — J9612 Chronic respiratory failure with hypercapnia: Principal | ICD-10-CM

## 2022-12-22 DIAGNOSIS — I5032 Chronic diastolic (congestive) heart failure: Principal | ICD-10-CM

## 2022-12-22 DIAGNOSIS — E662 Morbid (severe) obesity with alveolar hypoventilation: Principal | ICD-10-CM

## 2023-01-09 ENCOUNTER — Ambulatory Visit
Admit: 2023-01-09 | Attending: Student in an Organized Health Care Education/Training Program | Primary: Student in an Organized Health Care Education/Training Program

## 2023-02-09 DIAGNOSIS — R54 Age-related physical debility: Secondary | ICD-10-CM | POA: Insufficient documentation

## 2023-02-16 MED ORDER — ISOSORBIDE MONONITRATE ER 120 MG TABLET,EXTENDED RELEASE 24 HR
ORAL_TABLET | Freq: Every day | ORAL | 0 refills | 30.00 days | Status: CP
Start: 2023-02-16 — End: 2024-02-16

## 2023-03-04 ENCOUNTER — Encounter: Payer: Self-pay | Admitting: Internal Medicine

## 2023-03-04 ENCOUNTER — Ambulatory Visit: Payer: Medicare Other | Attending: Internal Medicine | Admitting: Internal Medicine

## 2023-03-04 VITALS — BP 110/66 | HR 85 | Ht 72.0 in | Wt 293.6 lb

## 2023-03-04 DIAGNOSIS — E1100 Type 2 diabetes mellitus with hyperosmolarity without nonketotic hyperglycemic-hyperosmolar coma (NKHHC): Secondary | ICD-10-CM

## 2023-03-04 DIAGNOSIS — E785 Hyperlipidemia, unspecified: Secondary | ICD-10-CM

## 2023-03-04 DIAGNOSIS — I5032 Chronic diastolic (congestive) heart failure: Secondary | ICD-10-CM | POA: Diagnosis not present

## 2023-03-04 DIAGNOSIS — Z955 Presence of coronary angioplasty implant and graft: Secondary | ICD-10-CM

## 2023-03-04 DIAGNOSIS — E662 Morbid (severe) obesity with alveolar hypoventilation: Secondary | ICD-10-CM

## 2023-03-04 DIAGNOSIS — Z794 Long term (current) use of insulin: Secondary | ICD-10-CM

## 2023-03-04 DIAGNOSIS — I25119 Atherosclerotic heart disease of native coronary artery with unspecified angina pectoris: Secondary | ICD-10-CM | POA: Diagnosis not present

## 2023-03-04 DIAGNOSIS — I10 Essential (primary) hypertension: Secondary | ICD-10-CM | POA: Diagnosis not present

## 2023-03-04 DIAGNOSIS — Q2111 Secundum atrial septal defect: Secondary | ICD-10-CM | POA: Diagnosis not present

## 2023-03-04 DIAGNOSIS — I2089 Other forms of angina pectoris: Secondary | ICD-10-CM

## 2023-03-04 DIAGNOSIS — K219 Gastro-esophageal reflux disease without esophagitis: Secondary | ICD-10-CM

## 2023-03-04 MED ORDER — ISOSORBIDE MONONITRATE ER 120 MG PO TB24: 120.0000 mg | ORAL_TABLET | Freq: Every day | ORAL | 3 refills | Status: DC

## 2023-03-04 MED ORDER — RANOLAZINE ER 500 MG PO TB12: 500.0000 mg | ORAL_TABLET | Freq: Two times a day (BID) | ORAL | 3 refills | Status: DC

## 2023-03-04 MED ORDER — NITROGLYCERIN 0.4 MG SL SUBL: 0.4000 mg | SUBLINGUAL_TABLET | SUBLINGUAL | 3 refills | Status: AC | PRN

## 2023-03-04 MED ORDER — LISINOPRIL 20 MG PO TABS: 20.0000 mg | ORAL_TABLET | Freq: Every day | ORAL | 3 refills | Status: DC

## 2023-03-04 MED ORDER — METOPROLOL TARTRATE 50 MG PO TABS: 50.0000 mg | ORAL_TABLET | Freq: Two times a day (BID) | ORAL | 3 refills | Status: DC

## 2023-03-04 MED ORDER — TORSEMIDE 20 MG PO TABS: 20.0000 mg | ORAL_TABLET | Freq: Every day | ORAL | 3 refills | Status: DC

## 2023-03-04 MED ORDER — CLOPIDOGREL BISULFATE 75 MG PO TABS: 75.0000 mg | ORAL_TABLET | Freq: Every day | ORAL | 3 refills | Status: DC

## 2023-03-04 NOTE — Progress Notes (Signed)
 Cardiology Office Note:  .   Date:  03/04/2023  ID:  OR [name], DOB 1955-03-23, MRN [id number] PCP: Tobi Fortes, MD  [facility] Providers Cardiologist:  None    History of Present Illness: .   Benjamin Bush is a 68 y.o. male.  Discussed the use of AI scribe software for clinical note transcription with the patient, who gave verbal consent to proceed.  History of Present Illness   The patient, with a history of coronary artery disease with four stents, type 2 diabetes, high cholesterol, high blood pressure, and chronic kidney disease, presents with ongoing chest pain. The pain is described as sometimes feeling like a heart attack, but the patient is unsure if it is related to their heart condition or a side effect of their medication, Imdur  and esophageal pain per description. He has also been started on Ranexa  500 mg BID and does feel that helps. The patient also reports difficulty swallowing, which they have experienced before and required esophageal stretching. The patient has not seen a GI doctor in 15-20 years but is considering a revisit due to this issue.  The patient also reports knee pain, described as bone-on-bone, which has been treated with "nerve numbing". The patient has been advised against further surgeries due to their heart condition and stents. The patient's partner reports that the patient's diet has been modified to include more nutrient-rich foods and less meat, and the patient has been engaging in regular physical activity, including walking and cycling. The patient has lost 5-6 pounds since these lifestyle changes were implemented.  The patient's medications include clopidogrel , lisinopril , metoprolol , nitroglycerin , ranolazine , torsemide , and insulin  for diabetes. The patient has been off Jardiance  due to cost and has stopped taking amlodipine  and aspirin . The patient reports that most of their medications are nearing the need for refills.        ROS:  negative except per HPI above.  Studies Reviewed: Benjamin Bush   EKG Interpretation Date/Time:  Wednesday March 04 2023 14:46:25 EST Ventricular Rate:  85 PR Interval:  196 QRS Duration:  90 QT Interval:  394 QTC Calculation: 468 R Axis:   -13  Text Interpretation: Normal sinus rhythm Normal ECG When compared with ECG of 22-Nov-2016 07:03, No significant change was found Confirmed by Grady Lawman ([id number]) on 03/04/2023 3:15:22 PM    Results   LABS A1c: 7.8% (09/2022) A1c: 8.6% (2023) LDL: 114 mg/dL (8413) Triglycerides: 244 mg/dL (0102)  DIAGNOSTIC Echocardiogram: Normal ejection fraction, mild left ventricular hypertrophy, diastolic dysfunction, normal right ventricle, normal heart valves (06/2022) OSH cannot review images. EKG: Normal (03/04/2023)     Risk Assessment/Calculations:             Physical Exam:   VS:  BP 110/66   Pulse 85   Ht 6' (1.829 m)   Wt 293 lb 9.6 oz (133.2 kg)   BMI 39.82 kg/m    Wt Readings from Last 3 Encounters:  03/04/23 293 lb 9.6 oz (133.2 kg)  11/24/16 (!) 325 lb 9.9 oz (147.7 kg)  02/25/13 (!) 319 lb 10.7 oz (145 kg)     Physical Exam   VITALS: SaO2- 93 CHEST: Lungs clear to auscultation CARDIOVASCULAR: Heart sounds normal     GEN: Well nourished, well developed in no acute distress NECK: No JVD; No carotid bruits CARDIAC: RRR, no murmurs, rubs, gallops RESPIRATORY:  Clear to auscultation without rales, wheezing or rhonchi  ABDOMEN: Soft, non-tender, non-distended EXTREMITIES:  No edema; No deformity  ASSESSMENT AND PLAN: .    Assessment & Plan Chronic stable angina (HCC)  Coronary artery disease involving native coronary artery of native heart with angina pectoris (HCC)  Chronic diastolic CHF (congestive heart failure) (HCC)  ASD secundum  Primary hypertension  Gastroesophageal reflux disease, unspecified whether esophagitis present  Type 2 diabetes mellitus with hyperosmolarity without coma, with long-term current use of  insulin  (HCC)  History of coronary artery stent placement  Obesity hypoventilation syndrome (HCC)  Hyperlipidemia LDL goal <100   Assessment and Plan    Coronary Artery Disease Chronic stable angina. History of 4 stents with ongoing chest pain. Normal pump function on last echocardiogram. Chest pain possibly related to esophageal issues and chronic stable angina. -Continue current medications: Clopidogrel , Isosorbide  Mononitrate 120mg , Ranolazine  500mg  BID. -Referral to GI for evaluation of swallowing difficulty and potential esophageal issues per PCP given prior esophageal dilation and dysphagia.  Type 2 Diabetes On insulin  therapy, recent improvement in A1C from 8.6 to 7.8. -Continue current management. -Consider consultation with endocrinologist for further management options. I will refer to cvrr for consideration of SGLT2I.  Hyperlipidemia LDL 114, Triglycerides 250 on last check. History of taking Repatha . -Consider restarting Ezetimibe 10mg . -Consultation with pharmacist to discuss potential for patient assistance with Repatha  and Jardiance .  Hypertension On Lisinopril  20mg  and Metoprolol  50mg  BID. -Continue current management.  Follow-up in 3-4 months in [place]. Refills provided today.     ASD s/p repair - historical, pt notes this was done at [facility]. OSH echo suggets rv ok.

## 2023-03-04 NOTE — Patient Instructions (Addendum)
 Medication Instructions:  Your physician recommends that you continue on your current medications as directed. Please refer to the Current Medication list given to you today.  *If you need a refill on your cardiac medications before your next appointment, please call your pharmacy*  Lab Work: None  Follow-Up: At [facility], you and your health needs are our priority.  As part of our continuing mission to provide you with exceptional heart care, we have created designated Provider Care Teams.  These Care Teams include your primary Cardiologist (physician) and Advanced Practice Providers (APPs -  Physician Assistants and Nurse Practitioners) who all work together to provide you with the care you need, when you need it.  Your next appointment:   3 -4 month(s)  Provider:   Selene Dais Office  Provider

## 2023-03-06 ENCOUNTER — Ambulatory Visit: Admit: 2023-03-06

## 2023-03-06 ENCOUNTER — Ambulatory Visit: Payer: Self-pay | Admitting: Professional Counselor

## 2023-03-18 NOTE — Progress Notes (Signed)
New Patient Office Visit   Subjective   Patient ID: Benjamin Bush, male    DOB: Sep 09, 1955  Age: 68 y.o. MRN: [id number]  CC:  Chief Complaint  Patient presents with   Establish Care    Diabetes management     HPI Benjamin Bush 68 year old male, presents to establish care. He  has a past medical history of (HFpEF) heart failure with preserved ejection fraction (HCC), CAD (coronary artery disease), Depression, Diabetes mellitus without complication (HCC), Headache, Hypercholesteremia, Hypertension, Morbid obesity (HCC), OSA (obstructive sleep apnea), PFO (patent foramen ovale), and PUD (peptic ulcer disease).  Diabetes He presents for his initial diabetic visit. He has type 2 diabetes mellitus. His disease course has been fluctuating. Pertinent negatives for hypoglycemia include no dizziness, headaches or tremors. Associated symptoms include visual change. Pertinent negatives for diabetes include no blurred vision and no foot ulcerations. Pertinent negatives for hypoglycemia complications include no blackouts. Diabetic complications include peripheral neuropathy. Risk factors for coronary artery disease include dyslipidemia, diabetes mellitus, hypertension, male sex and stress. Current diabetic treatment includes insulin injections and oral agent (monotherapy). He is compliant with treatment all of the time. He is currently taking insulin pre-breakfast and at bedtime. Insulin injections are given by patient. Rotation sites for injection include the abdominal wall. He is following a generally healthy diet. Meal planning includes avoidance of concentrated sweets. He participates in exercise daily. His breakfast blood glucose range is generally 140-180 mg/dl. His bedtime blood glucose range is generally 140-180 mg/dl. An ACE inhibitor/angiotensin II receptor blocker is being taken. He sees a podiatrist.Eye exam is current.      Outpatient Encounter Medications as of 03/20/2023  Medication Sig    albuterol (VENTOLIN HFA) 108 (90 Base) MCG/ACT inhaler Inhale 1 puff into the lungs every 4 (four) hours as needed for shortness of breath.   buPROPion (WELLBUTRIN XL) 300 MG 24 hr tablet Take 300 mg by mouth daily.   clopidogrel (PLAVIX) 75 MG tablet Take 1 tablet (75 mg total) by mouth daily with breakfast.   isosorbide mononitrate (IMDUR) 120 MG 24 hr tablet Take 1 tablet (120 mg total) by mouth daily.   lisinopril (ZESTRIL) 20 MG tablet Take 1 tablet (20 mg total) by mouth daily.   metoprolol tartrate (LOPRESSOR) 50 MG tablet Take 1 tablet (50 mg total) by mouth 2 (two) times daily.   nitroGLYCERIN (NITROSTAT) 0.4 MG SL tablet Place 1 tablet (0.4 mg total) under the tongue every 5 (five) minutes x 2 doses as needed for chest pain.   nitroGLYCERIN (NITROSTAT) 0.4 MG SL tablet Place 0.4 mg under the tongue every 5 (five) minutes as needed.   pantoprazole (PROTONIX) 40 MG tablet Take 40 mg by mouth daily.   sertraline (ZOLOFT) 25 MG tablet Take 1 tablet (25 mg total) by mouth daily.   testosterone cypionate (DEPOTESTOSTERONE CYPIONATE) 200 MG/ML injection Inject into the muscle.   tiZANidine (ZANAFLEX) 4 MG tablet Take 4 mg by mouth every 12 (twelve) hours as needed.   [DISCONTINUED] buPROPion (WELLBUTRIN) 100 MG tablet Take by mouth.   [DISCONTINUED] buPROPion (WELLBUTRIN) 100 MG tablet Take by mouth.   [DISCONTINUED] cephALEXin (KEFLEX) 500 MG capsule Take 500 mg by mouth 2 (two) times daily.   [DISCONTINUED] glipiZIDE (GLUCOTROL) 10 MG tablet Take 10 mg by mouth 2 (two) times daily.   [DISCONTINUED] oxyCODONE-acetaminophen (PERCOCET) 10-325 MG tablet Take 1 tablet by mouth every 6 (six) hours as needed for pain.   [DISCONTINUED] ranolazine (RANEXA) 500 MG  12 hr tablet Take 1 tablet (500 mg total) by mouth 2 (two) times daily.   [DISCONTINUED] torsemide (DEMADEX) 20 MG tablet Take 1 tablet (20 mg total) by mouth daily.   glipiZIDE (GLUCOTROL) 10 MG tablet Take 1 tablet (10 mg total) by mouth 2  (two) times daily.   insulin NPH Human (NOVOLIN N) 100 UNIT/ML injection Inject into the skin. (Patient not taking: Reported on 03/20/2023)   magnesium oxide (MAG-OX) 400 MG tablet Take by mouth. (Patient not taking: Reported on 03/20/2023)   ramipril (ALTACE) 2.5 MG capsule Take by mouth. (Patient not taking: Reported on 03/20/2023)   torsemide (DEMADEX) 20 MG tablet Take 1 tablet (20 mg total) by mouth daily.   [DISCONTINUED] amitriptyline (ELAVIL) 25 MG tablet  (Patient not taking: Reported on 03/20/2023)   [DISCONTINUED] amLODipine (NORVASC) 10 MG tablet Take by mouth. (Patient not taking: Reported on 03/20/2023)   [DISCONTINUED] amLODipine (NORVASC) 10 MG tablet Take 1 tablet by mouth 2 (two) times daily. (Patient not taking: Reported on 03/20/2023)   [DISCONTINUED] amoxicillin-clavulanate (AUGMENTIN) 875-125 MG tablet Take 1 tablet by mouth 2 (two) times daily. (Patient not taking: Reported on 03/20/2023)   [DISCONTINUED] aspirin EC 81 MG tablet Take 81 mg by mouth daily. (Patient not taking: Reported on 03/20/2023)   [DISCONTINUED] atorvastatin (LIPITOR) 80 MG tablet Take 1 tablet (80 mg total) by mouth daily. (Patient not taking: Reported on 03/20/2023)   [DISCONTINUED] atorvastatin (LIPITOR) 80 MG tablet Take 1 tablet by mouth daily. (Patient not taking: Reported on 03/20/2023)   [DISCONTINUED] Carboxymethylcellulose Sodium 0.25 % SOLN Administer 2 drops to both eyes 4 (four) times a day as needed. (Patient not taking: Reported on 03/20/2023)   [DISCONTINUED] cetirizine (ZYRTEC) 10 MG tablet Take by mouth. (Patient not taking: Reported on 03/04/2023)   [DISCONTINUED] donepezil (ARICEPT) 10 MG tablet Take by mouth. (Patient not taking: Reported on 03/20/2023)   [DISCONTINUED] doxycycline (VIBRA-TABS) 100 MG tablet Take 1 tablet (100 mg total) by mouth 2 (two) times daily. (Patient not taking: Reported on 03/20/2023)   [DISCONTINUED] DULoxetine (CYMBALTA) 30 MG capsule Take by mouth. (Patient not taking:  Reported on 03/20/2023)   [DISCONTINUED] DULoxetine (CYMBALTA) 60 MG capsule Take 60 mg by mouth daily. (Patient not taking: Reported on 03/20/2023)   [DISCONTINUED] Evolocumab (REPATHA SURECLICK) 140 MG/ML SOAJ Inject into the skin. (Patient not taking: Reported on 03/20/2023)   [DISCONTINUED] ezetimibe (ZETIA) 10 MG tablet Take 10 mg by mouth daily. (Patient not taking: Reported on 03/20/2023)   [DISCONTINUED] famotidine (PEPCID) 40 MG tablet Take by mouth. (Patient not taking: Reported on 03/20/2023)   [DISCONTINUED] fexofenadine (ALLEGRA) 180 MG tablet Take by mouth. (Patient not taking: Reported on 03/20/2023)   [DISCONTINUED] fluticasone-salmeterol (ADVAIR) 500-50 MCG/ACT AEPB Inhale 1 puff into the lungs 2 (two) times daily. (Patient not taking: Reported on 03/20/2023)   [DISCONTINUED] gabapentin (NEURONTIN) 800 MG tablet Take 800 mg by mouth 3 (three) times daily. (Patient not taking: Reported on 03/20/2023)   [DISCONTINUED] HYDROcodone-acetaminophen (NORCO) 10-325 MG tablet 4 (four) times daily. (Patient not taking: Reported on 03/20/2023)   [DISCONTINUED] hydrOXYzine (ATARAX/VISTARIL) 25 MG tablet Take by mouth. (Patient not taking: Reported on 03/20/2023)   [DISCONTINUED] Insulin Pen Needle (COMFORT EZ PEN NEEDLES) 31G X 5 MM MISC  (Patient not taking: Reported on 03/20/2023)   [DISCONTINUED] Insulin Syringe-Needle U-100 (BD VEO INSULIN SYRINGE U/F) 31G X 15/64" 0.5 ML MISC  (Patient not taking: Reported on 03/20/2023)   [DISCONTINUED] ipratropium (ATROVENT) 0.03 % nasal spray 1-2 sprays per  nostril up to 4 times daily as needed for runny nose or post nasal drip (Patient not taking: Reported on 03/20/2023)   [DISCONTINUED] ipratropium-albuterol (DUONEB) 0.5-2.5 (3) MG/3ML SOLN Inhale into the lungs. (Patient not taking: Reported on 03/20/2023)   [DISCONTINUED] methylPREDNISolone (MEDROL DOSEPAK) 4 MG TBPK tablet See admin instructions. follow package directions (Patient not taking: Reported on 03/20/2023)    [DISCONTINUED] Niacin CR 1000 MG TBCR Take by mouth. (Patient not taking: Reported on 03/20/2023)   [DISCONTINUED] PARoxetine (PAXIL) 20 MG tablet Take 1 tablet by mouth daily. (Patient not taking: Reported on 03/20/2023)   [DISCONTINUED] QUEtiapine (SEROQUEL) 50 MG tablet Take 50 mg by mouth at bedtime. (Patient not taking: Reported on 03/20/2023)   [DISCONTINUED] rOPINIRole (REQUIP) 2 MG tablet Take by mouth. (Patient not taking: Reported on 03/20/2023)   [DISCONTINUED] rosuvastatin (CRESTOR) 40 MG tablet Take by mouth. (Patient not taking: Reported on 03/20/2023)   [DISCONTINUED] sulfamethoxazole-trimethoprim (BACTRIM) 400-80 MG tablet Take 1 tablet by mouth 2 (two) times daily. (Patient not taking: Reported on 03/20/2023)   [DISCONTINUED] testosterone cypionate (DEPOTESTOTERONE CYPIONATE) 100 MG/ML injection SMARTSIG:Milliliter(s) IM (Patient not taking: Reported on 03/20/2023)   No facility-administered encounter medications on file as of 03/20/2023.    Past Surgical History:  Procedure Laterality Date   CORONARY ANGIOPLASTY WITH STENT PLACEMENT     HERNIA REPAIR     JOINT REPLACEMENT Left    knee replacement   SHOULDER SURGERY     vetebral fusion      Review of Systems  Constitutional:  Negative for fever.  Eyes:  Negative for blurred vision.  Respiratory:  Negative for shortness of breath.   Cardiovascular:  Negative for palpitations.  Gastrointestinal:  Negative for abdominal pain.  Genitourinary:  Negative for dysuria.  Neurological:  Negative for dizziness, tremors and headaches.      Objective    BP (!) 167/95   Pulse 81   Ht 6' (1.829 m)   Wt 292 lb 1.3 oz (132.5 kg)   SpO2 96%   BMI 39.61 kg/m   Physical Exam Vitals reviewed.  Constitutional:      General: He is not in acute distress.    Appearance: Normal appearance. He is not ill-appearing, toxic-appearing or diaphoretic.  HENT:     Head: Normocephalic.  Eyes:     General:        Right eye: No discharge.         Left eye: No discharge.     Conjunctiva/sclera: Conjunctivae normal.  Cardiovascular:     Rate and Rhythm: Normal rate.     Pulses: Normal pulses.     Heart sounds: Normal heart sounds.  Pulmonary:     Effort: Pulmonary effort is normal. No respiratory distress.     Breath sounds: Normal breath sounds.  Abdominal:     General: Bowel sounds are normal.     Palpations: Abdomen is soft.     Tenderness: There is no abdominal tenderness. There is no right CVA tenderness, left CVA tenderness or guarding.     Hernia: A hernia is present.  Skin:    General: Skin is warm and dry.     Capillary Refill: Capillary refill takes less than 2 seconds.  Neurological:     Mental Status: He is alert.  Psychiatric:        Mood and Affect: Mood normal.        Behavior: Behavior normal.       Assessment & Plan:  Primary hypertension Assessment &  Plan: Patient being Followed by Cardiology Current regimen: Metoprolol 50 mg twice daily and Lisinopril 20 mg once daily, torsemide 20 mg once daily Labs ordered. Discussed with  patient to monitor their blood pressure regularly and maintain a heart-healthy diet rich in fruits, vegetables, whole grains, and low-fat dairy, while reducing sodium intake to less than 2,300 mg per day. Regular physical activity, such as 30 minutes of moderate exercise most days of the week, will help lower blood pressure and improve overall cardiovascular health. Avoiding smoking, limiting alcohol consumption, and managing stress. Take  prescribed medication, & take it as directed and avoid skipping doses. Seek emergency care if your blood pressure is (over 180/100) or you experience chest pain, shortness of breath, or sudden vision changes.Patient verbalizes understanding regarding plan of care and all questions answered. Follow up in 2 weeks with at home blood pressure to monitor trends via mychart  Orders: -     TSH + free T4 -     CBC with Differential/Platelet -     Lipid  panel  Type 2 diabetes mellitus with stage 3a chronic kidney disease, unspecified whether long term insulin use (HCC) -     Hemoglobin A1c -     Ambulatory referral to Ophthalmology  Testosterone deficiency in male -     Testosterone,Free and Total  Type 2 diabetes mellitus with stage 3 chronic kidney disease, with long-term current use of insulin, unspecified whether stage 3a or 3b CKD (HCC) Assessment & Plan: Last Hemoglobin A1c: 9 Labs: Ordered today, results pending; will follow up accordingly. The patient reports adhering to prescribed medications: Patient reports using a sliding scale with [employer]'s Novolin 70/30 insulin due to its affordable cost of $25. glipizide 10 twice daily   Reviewed non-pharmacological interventions, including a balanced diet rich in lean proteins, healthy fats, whole grains, and high-fiber vegetables. Emphasized reducing refined sugars and processed carbohydrates, and incorporating more fruits, leafy greens, and legumes. Education: Patient was educated on recognizing signs and symptoms of both hypoglycemia and hyperglycemia, and advised to seek emergency care if these symptoms occur. Follow-Up: Scheduled for follow-up in 3-4 months, or sooner if needed. Patient Understanding: The patient verbalized understanding of the care plan, and all questions were answered. Additional Care: Ophthalmology referral was placed. Foot exam results were within normal limits.    Other orders -     glipiZIDE; Take 1 tablet (10 mg total) by mouth 2 (two) times daily.  Dispense: 60 tablet; Refill: 2 -     Sertraline HCl; Take 1 tablet (25 mg total) by mouth daily.  Dispense: 30 tablet; Refill: 3 -     Torsemide; Take 1 tablet (20 mg total) by mouth daily.  Dispense: 90 tablet; Refill: 3    Return in about 4 months (around 07/18/2023), or if symptoms worsen or fail to improve, for type 2 diabetes.   Cruzita Lederer Newman Nip, FNP

## 2023-03-18 NOTE — Patient Instructions (Addendum)
        Great to see you today.  I have refilled the medication(s) we provide.      Check Blood Glucose 2-3 times daily   Fasting Blood Glucose (Before Meals): Target for Diabetes Management: 80-130     Postprandial Blood Glucose (1-2 Hours After Meals): Target for Diabetes Management: Less than 180    ----------------------------------------------------------------------------------------------------------------------------------------------  Start taking Zoloft 25 mg and we will increase dose every 4 weeks if needed Wellbutrin Medication Tapering Schedule   Week 1: Take regular dose every other day. Week 2: Take regular dose every third day. Week 3: Take regular dose every fourth day. Week 4: Take regular dose every fifth day. Week 5: Discontinue Wellbutrin Medication completely.    If labs were collected, we will inform you of lab results once received either by echart message or telephone call.   - echart message- for normal results that have been seen by the patient already.   - telephone call: abnormal results or if patient has not viewed results in their echart.   - Please take medications as prescribed. - Follow up with your primary health provider if any health concerns arises. - If symptoms worsen please contact your primary care provider and/or visit the emergency department.

## 2023-03-20 ENCOUNTER — Ambulatory Visit (INDEPENDENT_AMBULATORY_CARE_PROVIDER_SITE_OTHER): Payer: Medicare Other | Admitting: Family Medicine

## 2023-03-20 ENCOUNTER — Encounter: Payer: Self-pay | Admitting: Family Medicine

## 2023-03-20 VITALS — BP 167/95 | HR 81 | Ht 72.0 in | Wt 292.1 lb

## 2023-03-20 DIAGNOSIS — Z794 Long term (current) use of insulin: Secondary | ICD-10-CM

## 2023-03-20 DIAGNOSIS — I10 Essential (primary) hypertension: Secondary | ICD-10-CM

## 2023-03-20 DIAGNOSIS — E1122 Type 2 diabetes mellitus with diabetic chronic kidney disease: Secondary | ICD-10-CM

## 2023-03-20 DIAGNOSIS — E291 Testicular hypofunction: Secondary | ICD-10-CM | POA: Diagnosis not present

## 2023-03-20 DIAGNOSIS — N1831 Chronic kidney disease, stage 3a: Secondary | ICD-10-CM | POA: Diagnosis not present

## 2023-03-20 DIAGNOSIS — N183 Chronic kidney disease, stage 3 unspecified: Secondary | ICD-10-CM

## 2023-03-20 MED ORDER — TORSEMIDE 20 MG PO TABS: 20.0000 mg | ORAL_TABLET | Freq: Every day | ORAL | 3 refills | Status: DC

## 2023-03-20 MED ORDER — SERTRALINE HCL 25 MG PO TABS: 25.0000 mg | ORAL_TABLET | Freq: Every day | ORAL | 3 refills | Status: DC

## 2023-03-20 MED ORDER — GLIPIZIDE 10 MG PO TABS: 10.0000 mg | ORAL_TABLET | Freq: Two times a day (BID) | ORAL | 2 refills | Status: DC

## 2023-03-20 NOTE — Assessment & Plan Note (Signed)
Patient being Followed by Cardiology Current regimen: Metoprolol 50 mg twice daily and Lisinopril 20 mg once daily, torsemide 20 mg once daily Labs ordered. Discussed with  patient to monitor their blood pressure regularly and maintain a heart-healthy diet rich in fruits, vegetables, whole grains, and low-fat dairy, while reducing sodium intake to less than 2,300 mg per day. Regular physical activity, such as 30 minutes of moderate exercise most days of the week, will help lower blood pressure and improve overall cardiovascular health. Avoiding smoking, limiting alcohol consumption, and managing stress. Take  prescribed medication, & take it as directed and avoid skipping doses. Seek emergency care if your blood pressure is (over 180/100) or you experience chest pain, shortness of breath, or sudden vision changes.Patient verbalizes understanding regarding plan of care and all questions answered. Follow up in 2 weeks with at home blood pressure to monitor trends via mychart

## 2023-03-20 NOTE — Assessment & Plan Note (Signed)
Last Hemoglobin A1c: 9 Labs: Ordered today, results pending; will follow up accordingly. The patient reports adhering to prescribed medications: Patient reports using a sliding scale with [employer]'s Novolin 70/30 insulin due to its affordable cost of $25. glipizide 10 twice daily   Reviewed non-pharmacological interventions, including a balanced diet rich in lean proteins, healthy fats, whole grains, and high-fiber vegetables. Emphasized reducing refined sugars and processed carbohydrates, and incorporating more fruits, leafy greens, and legumes. Education: Patient was educated on recognizing signs and symptoms of both hypoglycemia and hyperglycemia, and advised to seek emergency care if these symptoms occur. Follow-Up: Scheduled for follow-up in 3-4 months, or sooner if needed. Patient Understanding: The patient verbalized understanding of the care plan, and all questions were answered. Additional Care: Ophthalmology referral was placed. Foot exam results were within normal limits.

## 2023-03-25 ENCOUNTER — Encounter: Payer: Self-pay | Admitting: Family Medicine

## 2023-03-25 ENCOUNTER — Other Ambulatory Visit: Payer: Self-pay | Admitting: Family Medicine

## 2023-03-25 DIAGNOSIS — R7989 Other specified abnormal findings of blood chemistry: Secondary | ICD-10-CM

## 2023-03-25 MED ORDER — EMPAGLIFLOZIN 10 MG PO TABS: 10.0000 mg | ORAL_TABLET | Freq: Every day | ORAL | 2 refills | Status: DC

## 2023-03-25 MED ORDER — ATORVASTATIN CALCIUM 40 MG PO TABS: 40.0000 mg | ORAL_TABLET | Freq: Every day | ORAL | 3 refills | Status: AC

## 2023-03-26 ENCOUNTER — Telehealth: Payer: Self-pay

## 2023-03-26 NOTE — Telephone Encounter (Signed)
 Copied from CRM [id number]. Topic: Clinical - Prescription Issue >> Mar 26, 2023  1:19 PM Makayla J wrote: Reason for CRM: Pt states he was prescribed empagliflozin  (JARDIANCE ) but his insurance doesn't cover it and he has to pay 250$ out of pocket. He wants to know if he can be prescribed an alternative for his diabetes   Callback # [phone]

## 2023-03-26 NOTE — Telephone Encounter (Signed)
 Please ask patient  we can start him on Metformin 

## 2023-03-27 LAB — CBC WITH DIFFERENTIAL/PLATELET
Basophils Absolute: 0.1 10*3/uL (ref 0.0–0.2)
Basos: 1 %
EOS (ABSOLUTE): 0.3 10*3/uL (ref 0.0–0.4)
Eos: 3 %
Hematocrit: 50.9 % (ref 37.5–51.0)
Hemoglobin: 16.7 g/dL (ref 13.0–17.7)
Immature Grans (Abs): 0 10*3/uL (ref 0.0–0.1)
Immature Granulocytes: 0 %
Lymphocytes Absolute: 1.7 10*3/uL (ref 0.7–3.1)
Lymphs: 21 %
MCH: 29 pg (ref 26.6–33.0)
MCHC: 32.8 g/dL (ref 31.5–35.7)
MCV: 89 fL (ref 79–97)
Monocytes Absolute: 0.6 10*3/uL (ref 0.1–0.9)
Monocytes: 8 %
Neutrophils Absolute: 5.5 10*3/uL (ref 1.4–7.0)
Neutrophils: 67 %
Platelets: 234 10*3/uL (ref 150–450)
RBC: 5.75 x10E6/uL (ref 4.14–5.80)
RDW: 15 % (ref 11.6–15.4)
WBC: 8.1 10*3/uL (ref 3.4–10.8)

## 2023-03-27 LAB — LIPID PANEL
Chol/HDL Ratio: 8.1 {ratio} — ABNORMAL HIGH (ref 0.0–5.0)
Cholesterol, Total: 227 mg/dL — ABNORMAL HIGH (ref 100–199)
HDL: 28 mg/dL — ABNORMAL LOW (ref >39)
LDL Chol Calc (NIH): 147 mg/dL — ABNORMAL HIGH (ref 0–99)
Triglycerides: 283 mg/dL — ABNORMAL HIGH (ref 0–149)
VLDL Cholesterol Cal: 52 mg/dL — ABNORMAL HIGH (ref 5–40)

## 2023-03-27 LAB — TESTOSTERONE,FREE AND TOTAL
Testosterone, Free: 9.9 pg/mL (ref 6.6–18.1)
Testosterone: 224 ng/dL — ABNORMAL LOW (ref 264–916)

## 2023-03-27 LAB — HEMOGLOBIN A1C
Est. average glucose Bld gHb Est-mCnc: 180 mg/dL
Hgb A1c MFr Bld: 7.9 % — ABNORMAL HIGH (ref 4.8–5.6)

## 2023-03-27 LAB — TSH+FREE T4
Free T4: 1.29 ng/dL (ref 0.82–1.77)
TSH: 1.43 u[IU]/mL (ref 0.450–4.500)

## 2023-03-31 ENCOUNTER — Telehealth: Payer: Self-pay

## 2023-03-31 NOTE — Telephone Encounter (Signed)
Information sent to provider for review and new plan of treatment.

## 2023-04-01 ENCOUNTER — Ambulatory Visit: Admit: 2023-04-01

## 2023-04-01 ENCOUNTER — Other Ambulatory Visit: Payer: Self-pay | Admitting: Family Medicine

## 2023-04-01 DIAGNOSIS — Z794 Long term (current) use of insulin: Secondary | ICD-10-CM

## 2023-04-01 MED ORDER — OZEMPIC (0.25 OR 0.5 MG/DOSE) 2 MG/3ML ~~LOC~~ SOPN: 0.2500 mg | PEN_INJECTOR | SUBCUTANEOUS | 0 refills | Status: DC

## 2023-04-01 NOTE — Telephone Encounter (Signed)
I sent Ozmepic 0.25 mg weekly injection as an alternative, I sent detailed message to patient via my chart

## 2023-04-09 ENCOUNTER — Ambulatory Visit: Payer: Medicare Other | Admitting: Family Medicine

## 2023-04-10 ENCOUNTER — Ambulatory Visit: Payer: Medicare Other | Attending: Cardiology | Admitting: Pharmacist

## 2023-04-10 ENCOUNTER — Other Ambulatory Visit (HOSPITAL_COMMUNITY): Payer: Self-pay

## 2023-04-10 ENCOUNTER — Telehealth: Payer: Self-pay | Admitting: Pharmacy Technician

## 2023-04-10 ENCOUNTER — Telehealth: Payer: Self-pay | Admitting: Pharmacist

## 2023-04-10 ENCOUNTER — Encounter: Payer: Self-pay | Admitting: Pharmacist

## 2023-04-10 DIAGNOSIS — E1100 Type 2 diabetes mellitus with hyperosmolarity without nonketotic hyperglycemic-hyperosmolar coma (NKHHC): Secondary | ICD-10-CM | POA: Diagnosis not present

## 2023-04-10 DIAGNOSIS — Z794 Long term (current) use of insulin: Secondary | ICD-10-CM

## 2023-04-10 DIAGNOSIS — I2089 Other forms of angina pectoris: Secondary | ICD-10-CM

## 2023-04-10 DIAGNOSIS — I25119 Atherosclerotic heart disease of native coronary artery with unspecified angina pectoris: Secondary | ICD-10-CM

## 2023-04-10 DIAGNOSIS — E785 Hyperlipidemia, unspecified: Secondary | ICD-10-CM

## 2023-04-10 MED ORDER — METFORMIN HCL 500 MG PO TABS: 500.0000 mg | ORAL_TABLET | Freq: Two times a day (BID) | ORAL | 5 refills | Status: DC

## 2023-04-10 NOTE — Telephone Encounter (Signed)
 PA request has been Submitted FOR REPATHA. New Encounter created for follow up. For additional info see Pharmacy Prior Auth telephone encounter from 04/10/23.    Ran test claim for JARIDANCE. For a 30 day supply and the co-pay is 248.95 . This test claim was processed through [facility]- copay amounts may vary at other pharmacies due to pharmacy/plan contracts, or as the patient moves through the different stages of their insurance plan.

## 2023-04-10 NOTE — Telephone Encounter (Signed)
 Pharmacy Patient Advocate Encounter   Received notification from Pt Calls Messages that prior authorization for repatha is required/requested.   Insurance verification completed.   The patient is insured through [employer] .   Per test claim: PA required; PA submitted to above mentioned insurance via CoverMyMeds Key/confirmation #/EOC [id number] Status is pending

## 2023-04-10 NOTE — Telephone Encounter (Signed)
 Please complete PA for Repatha and Jardiance

## 2023-04-10 NOTE — Progress Notes (Signed)
 Patient ID: [name]                 DOB: February 12, 1956                    MRN: [id number]     HPI: Benjamin Bush is a 68 y.o. male patient referred to lipid clinic by Dr Jacques Navy. PMH is significant for angina, CAD, CHF, PE, COPD, and T2DM. Expressed to Dr Jacques Navy at last visit he could not afford current medications.  Patient presents today with significant other. She prepares foods for him and will not allow him to eat junk food.  Has been prescribed Repatha in the past but can no longer afford. Placed on Jardiance for CHF and T2DM but also could not afford.  Wears a CGM for glucose monitoring. Reports he needs metformin refills.  Current Medications:  Atorvastatin 40mg  daily  Risk Factors:  CAD Hx of MI CHF T2DM  LDL goal: <55  Labs: TC 227, Trigs 283, HDL 28, LDL 147 (03/24/23)  Past Medical History:  Diagnosis Date   (HFpEF) heart failure with preserved ejection fraction (HCC)    a. 06/2016 Echo: Ef >55%, gr1 DD, dil RV w/ nl syst fxn, mildly dil LA.   CAD (coronary artery disease)    a. s/p stenting x 2 (RCA/LCX)- [place], [place]; b. 2014 Cath: mild RCA/LCX ISR.   Depression    Diabetes mellitus without complication (HCC)    Headache    Hypercholesteremia    Hypertension    Morbid obesity (HCC)    OSA (obstructive sleep apnea)    PFO (patent foramen ovale)    a. s/p percutaneous closure @ [facility]).   PUD (peptic ulcer disease)     Current Outpatient Medications on File Prior to Visit  Medication Sig Dispense Refill   albuterol (VENTOLIN HFA) 108 (90 Base) MCG/ACT inhaler Inhale 1 puff into the lungs every 4 (four) hours as needed for shortness of breath.     atorvastatin (LIPITOR) 40 MG tablet Take 1 tablet (40 mg total) by mouth daily. 90 tablet 3   buPROPion (WELLBUTRIN XL) 300 MG 24 hr tablet Take 300 mg by mouth daily.     clopidogrel (PLAVIX) 75 MG tablet Take 1 tablet (75 mg total) by mouth daily with breakfast. 90 tablet 3   glipiZIDE  (GLUCOTROL) 10 MG tablet Take 1 tablet (10 mg total) by mouth 2 (two) times daily. 60 tablet 2   insulin NPH Human (NOVOLIN N) 100 UNIT/ML injection Inject into the skin. (Patient not taking: Reported on 03/20/2023)     isosorbide mononitrate (IMDUR) 120 MG 24 hr tablet Take 1 tablet (120 mg total) by mouth daily. 90 tablet 3   lisinopril (ZESTRIL) 20 MG tablet Take 1 tablet (20 mg total) by mouth daily. 90 tablet 3   magnesium oxide (MAG-OX) 400 MG tablet Take by mouth. (Patient not taking: Reported on 03/20/2023)     metoprolol tartrate (LOPRESSOR) 50 MG tablet Take 1 tablet (50 mg total) by mouth 2 (two) times daily. 180 tablet 3   nitroGLYCERIN (NITROSTAT) 0.4 MG SL tablet Place 1 tablet (0.4 mg total) under the tongue every 5 (five) minutes x 2 doses as needed for chest pain. 30 tablet 3   nitroGLYCERIN (NITROSTAT) 0.4 MG SL tablet Place 0.4 mg under the tongue every 5 (five) minutes as needed.     pantoprazole (PROTONIX) 40 MG tablet Take 40 mg by mouth daily.  ramipril (ALTACE) 2.5 MG capsule Take by mouth. (Patient not taking: Reported on 03/20/2023)     Semaglutide,0.25 or 0.5MG /DOS, (OZEMPIC, 0.25 OR 0.5 MG/DOSE,) 2 MG/3ML SOPN Inject 0.25 mg into the skin once a week. 3 mL 0   sertraline (ZOLOFT) 25 MG tablet Take 1 tablet (25 mg total) by mouth daily. 30 tablet 3   testosterone cypionate (DEPOTESTOSTERONE CYPIONATE) 200 MG/ML injection Inject into the muscle.     tiZANidine (ZANAFLEX) 4 MG tablet Take 4 mg by mouth every 12 (twelve) hours as needed.     torsemide (DEMADEX) 20 MG tablet Take 1 tablet (20 mg total) by mouth daily. 90 tablet 3   No current facility-administered medications on file prior to visit.    Allergies  Allergen Reactions   Codeine Swelling    Assessment/Plan:  1. Hyperlipidemia - Patinet last LDL 147 which is aobve goal of <55. Aggressive goal due to CAD< CHF, and T2DM. Recommend addition of Repatha to atorvastatin therapy. Discussed mechansim of action,  storage, site selection and administration. Will complete PA and contact patient. Enrolled in grant program.  Continue atorvastatin 40mg  daily Start Repatha 140mg  q 2 week Recheck lipid panel in 3 months  2. DM - Patient's last A1c 7.9% is above goal of <7.0%. Will restart patient;s metformin and enrolled patient in grant program for Jardiance. Reviewed options for lowering LDL cholesterol, including statins, ezetimibe, PCSK9 inhibitors, Nexletol/Nexlizet, and Leqvio. Discussed efficacy, dosing, side effects, and copay information.   Start metformin 500mg  BID Start Jardiance 10mg  daily  Laural Golden, PharmD, BCACP, CDCES, CPP [address] [place], [place], [address] Phone: [phone], Fax: [phone]

## 2023-04-10 NOTE — Patient Instructions (Addendum)
 It was nice meeting you today  We would like your LDL (bad cholesterol) to be less than 55  I will complete the prior authorizations for your Repatha and Jardiance. I have enrolled you in our grant program so you can receive the medications for no charge  I will also refill your metformin  Once you start the Repatha we would like to recheck your fasting lipid panel in about 3 months  Laural Golden, PharmD, BCACP, CDCES, CPP [address], [address] [place], [place], [address] Phone: [phone], Fax: [phone]

## 2023-04-13 ENCOUNTER — Other Ambulatory Visit (HOSPITAL_COMMUNITY): Payer: Self-pay

## 2023-04-13 NOTE — Telephone Encounter (Signed)
 Pharmacy Patient Advocate Encounter  Received notification from [facility] that Prior Authorization for repatha has been APPROVED from 04/10/23 to 10/08/23. Ran test claim, Copay is $248.95. This test claim was processed through [facility]- copay amounts may vary at other pharmacies due to pharmacy/plan contracts, or as the patient moves through the different stages of their insurance plan.   PA #/Case ID/Reference #: [id number]

## 2023-04-15 MED ORDER — TORSEMIDE 20 MG TABLET
ORAL_TABLET | Freq: Every day | ORAL | 2 refills | 90.00 days | Status: CP
Start: 2023-04-15 — End: ?

## 2023-04-15 MED ORDER — LISINOPRIL 20 MG TABLET
ORAL_TABLET | Freq: Every day | ORAL | 2 refills | 90.00 days | Status: CP
Start: 2023-04-15 — End: ?

## 2023-04-16 ENCOUNTER — Other Ambulatory Visit (HOSPITAL_COMMUNITY): Payer: Self-pay

## 2023-04-16 ENCOUNTER — Other Ambulatory Visit: Payer: Self-pay

## 2023-04-16 MED ORDER — REPATHA SURECLICK 140 MG/ML ~~LOC~~ SOAJ
1.0000 mL | SUBCUTANEOUS | 1 refills | Status: DC
  Filled 2023-04-16: qty 6, 84d supply, fill #0

## 2023-04-16 NOTE — Addendum Note (Signed)
 Addended by: Cheree Ditto on: 04/16/2023 07:55 AM   Modules accepted: Orders

## 2023-04-21 ENCOUNTER — Ambulatory Visit: Admit: 2023-04-21

## 2023-04-21 ENCOUNTER — Other Ambulatory Visit (HOSPITAL_COMMUNITY): Payer: Self-pay

## 2023-04-21 ENCOUNTER — Other Ambulatory Visit: Payer: Self-pay

## 2023-04-21 ENCOUNTER — Telehealth: Payer: Self-pay | Admitting: Pharmacy Technician

## 2023-04-21 ENCOUNTER — Encounter: Payer: Self-pay | Admitting: Pharmacy Technician

## 2023-04-21 ENCOUNTER — Telehealth: Payer: Self-pay | Admitting: Pharmacist

## 2023-04-21 DIAGNOSIS — I25119 Atherosclerotic heart disease of native coronary artery with unspecified angina pectoris: Secondary | ICD-10-CM

## 2023-04-21 DIAGNOSIS — E785 Hyperlipidemia, unspecified: Secondary | ICD-10-CM

## 2023-04-21 MED ORDER — REPATHA SURECLICK 140 MG/ML ~~LOC~~ SOAJ: 1.0000 mL | SUBCUTANEOUS | 1 refills | Status: AC

## 2023-04-21 NOTE — Telephone Encounter (Signed)
 Patient Advocate Encounter   The patient was approved for a [employer] grant that will help cover the cost of repatha Total amount awarded, 2500.  Effective: 03/11/23 - 03/09/24   [id number] [id number] [employer] ID: [id number] Pharmacy provided with approval and processing information. Patient informed via mychart

## 2023-04-21 NOTE — Telephone Encounter (Signed)
 Patient called and left message. Had not received Repatha presription. Resent to [employer]

## 2023-04-24 MED ORDER — CLOPIDOGREL 75 MG TABLET
ORAL_TABLET | Freq: Every day | ORAL | 0 refills | 90.00 days | Status: CP
Start: 2023-04-24 — End: ?

## 2023-05-20 LAB — LIPID PANEL
Chol/HDL Ratio: 4.5 ratio (ref 0.0–5.0)
Cholesterol, Total: 121 mg/dL (ref 100–199)
HDL: 27 mg/dL — ABNORMAL LOW (ref >39)
LDL Chol Calc (NIH): 63 mg/dL (ref 0–99)
Triglycerides: 186 mg/dL — ABNORMAL HIGH (ref 0–149)
VLDL Cholesterol Cal: 31 mg/dL (ref 5–40)

## 2023-05-27 ENCOUNTER — Ambulatory Visit (INDEPENDENT_AMBULATORY_CARE_PROVIDER_SITE_OTHER): Payer: Medicare Other | Admitting: Family Medicine

## 2023-05-27 ENCOUNTER — Encounter: Payer: Self-pay | Admitting: Family Medicine

## 2023-05-27 VITALS — BP 134/77 | HR 53 | Ht 72.0 in | Wt 291.0 lb

## 2023-05-27 DIAGNOSIS — E538 Deficiency of other specified B group vitamins: Secondary | ICD-10-CM | POA: Diagnosis not present

## 2023-05-27 DIAGNOSIS — E119 Type 2 diabetes mellitus without complications: Secondary | ICD-10-CM

## 2023-05-27 DIAGNOSIS — E291 Testicular hypofunction: Secondary | ICD-10-CM

## 2023-05-27 DIAGNOSIS — E1169 Type 2 diabetes mellitus with other specified complication: Secondary | ICD-10-CM

## 2023-05-27 DIAGNOSIS — E559 Vitamin D deficiency, unspecified: Secondary | ICD-10-CM | POA: Diagnosis not present

## 2023-05-27 DIAGNOSIS — Z1211 Encounter for screening for malignant neoplasm of colon: Secondary | ICD-10-CM

## 2023-05-27 DIAGNOSIS — R1084 Generalized abdominal pain: Secondary | ICD-10-CM

## 2023-05-27 DIAGNOSIS — R109 Unspecified abdominal pain: Secondary | ICD-10-CM | POA: Insufficient documentation

## 2023-05-27 MED ORDER — SUCRALFATE 1 G PO TABS
1.0000 g | ORAL_TABLET | Freq: Two times a day (BID) | ORAL | 2 refills | Status: DC
Start: 2023-05-27 — End: 2023-08-10

## 2023-05-27 MED ORDER — SERTRALINE HCL 50 MG PO TABS: 50.0000 mg | ORAL_TABLET | Freq: Every day | ORAL | 2 refills | Status: DC

## 2023-05-27 NOTE — Progress Notes (Signed)
 Established Patient Office Visit   Subjective  Patient ID: Benjamin Bush, male    DOB: [birth year]  Age: 68 y.o. MRN: [id number]  Chief Complaint  Patient presents with   Acute Visit    GI concerns has to go to the restroom w/in 30 min of eating. Burning and stabbing pain around his hernia. Causing nausea, and stomach upset. When he lays down his hernia feels like it is pushing thru the abdomen wall.  Would like to discuss his testosterone levels.  Increase dosage of zoloft     He  has a past medical history of (HFpEF) heart failure with preserved ejection fraction (HCC), CAD (coronary artery disease), Depression, Diabetes mellitus without complication (HCC), Headache, Hypercholesteremia, Hypertension, Morbid obesity (HCC), OSA (obstructive sleep apnea), PFO (patent foramen ovale), and PUD (peptic ulcer disease).  Abdominal Pain  The patient presents with recurrent abdominal pain, with the current episode ongoing for over one month. The onset was sudden, and the pain has been constant with a rapidly worsening course. The discomfort is diffuse, involving the generalized abdominal region, and is rated at a severity of 10/10. The pain is described as colicky, burning, and sharp, radiating to the periumbilical and epigastric regions. Associated symptoms include diarrhea, hematochezia, nausea, and vomiting. The pain is exacerbated by eating and is mildly relieved with the use of herbal chews. The patient has also trialed antacids, which provided minimal relief.    Review of Systems  Constitutional:  Negative for chills and fever.  Eyes:  Negative for blurred vision.  Respiratory:  Negative for shortness of breath.   Cardiovascular:  Negative for palpitations.  Gastrointestinal:  Positive for abdominal pain, diarrhea, heartburn, melena, nausea and vomiting. Negative for blood in stool and constipation.  Neurological:  Negative for dizziness and headaches.      Objective:     BP 134/77    Pulse (!) 53   Ht 6' (1.829 m)   Wt 291 lb 0.6 oz (132 kg)   SpO2 93%   BMI 39.47 kg/m  BP Readings from Last 3 Encounters:  05/27/23 134/77  03/20/23 (!) 167/95  03/04/23 110/66      Physical Exam Vitals reviewed.  Constitutional:      General: He is not in acute distress.    Appearance: Normal appearance. He is not ill-appearing, toxic-appearing or diaphoretic.  HENT:     Head: Normocephalic.  Eyes:     General:        Right eye: No discharge.        Left eye: No discharge.     Conjunctiva/sclera: Conjunctivae normal.  Cardiovascular:     Rate and Rhythm: Normal rate.     Pulses: Normal pulses.     Heart sounds: Normal heart sounds.  Pulmonary:     Effort: Pulmonary effort is normal. No respiratory distress.     Breath sounds: Normal breath sounds.  Abdominal:     General: Bowel sounds are normal. There is distension.     Palpations: Abdomen is soft. There is mass.     Tenderness: There is abdominal tenderness. There is guarding. There is no right CVA tenderness or left CVA tenderness.     Hernia: A hernia is present.  Skin:    General: Skin is warm and dry.     Capillary Refill: Capillary refill takes less than 2 seconds.  Neurological:     Mental Status: He is alert.     Coordination: Coordination normal.     Gait:  Gait normal.  Psychiatric:        Mood and Affect: Mood normal.        Behavior: Behavior normal.      No results found for any visits on 05/27/23.  The ASCVD Risk score (Arnett DK, et al., 2019) failed to calculate for the following reasons:   Risk score cannot be calculated because patient has a medical history suggesting prior/existing ASCVD    Assessment & Plan:  Testosterone deficiency in male -     Testosterone,Free and Total  Generalized abdominal pain Assessment & Plan: Hx 5 hernia repairs, RUQ abdominal mass felt on palpitation Abdominal CT scan ordered Trial on Carafate 2 times daily with meals Increase oral fluid intake.  Bland diet as tolerated. Avoid fluids that have a lot sugar or caffeine, Avoid spicy or fatty food. Avoid alcohol. Can take OTC tylenol for pain. Follow-up in unable to keep food/fluid down x 24 hours, dizziness, fevers, worsening or persistent symptoms to present to ED or contact primary care provider. Patient verbalizes understanding regarding plan of care and all questions answered.     Orders: -     CT ABDOMEN PELVIS WO CONTRAST; Future  Vitamin D deficiency -     VITAMIN D 25 Hydroxy (Vit-D Deficiency, Fractures)  Vitamin B12 deficiency -     Vitamin B12  Type 2 diabetes mellitus without complication, without long-term current use of insulin (HCC) -     Ambulatory referral to Ophthalmology  Screening for colon cancer -     Cologuard  Other orders -     Sertraline HCl; Take 1 tablet (50 mg total) by mouth daily.  Dispense: 30 tablet; Refill: 2 -     Sucralfate; Take 1 tablet (1 g total) by mouth 2 (two) times daily with a meal.  Dispense: 60 tablet; Refill: 2    No follow-ups on file.   Cruzita Lederer Newman Nip, FNP

## 2023-05-27 NOTE — Assessment & Plan Note (Signed)
 Hx 5 hernia repairs, RUQ abdominal mass felt on palpitation Abdominal CT scan ordered Trial on Carafate 2 times daily with meals Increase oral fluid intake. Bland diet as tolerated. Avoid fluids that have a lot sugar or caffeine, Avoid spicy or fatty food. Avoid alcohol. Can take OTC tylenol for pain. Follow-up in unable to keep food/fluid down x 24 hours, dizziness, fevers, worsening or persistent symptoms to present to ED or contact primary care provider. Patient verbalizes understanding regarding plan of care and all questions answered.

## 2023-05-27 NOTE — Patient Instructions (Addendum)
        Great to see you today.  I have refilled the medication(s) we provide.    Benjamin Rend MD Address: [address] [place], [place], [place] [address] Phone: [phone]    If labs were collected, we will inform you of lab results once received either by echart message or telephone call.   - echart message- for normal results that have been seen by the patient already.   - telephone call: abnormal results or if patient has not viewed results in their echart.   - Please take medications as prescribed. - Follow up with your primary health provider if any health concerns arises. - If symptoms worsen please contact your primary care provider and/or visit the emergency department.      Check Blood Glucose 2 times daily   Fasting Blood Glucose (Before Meals): Target for Diabetes Management: 80-140     Postprandial Blood Glucose (1-2 Hours After Meals): Target for Diabetes Management: Less than 200

## 2023-05-28 ENCOUNTER — Other Ambulatory Visit: Payer: Self-pay | Admitting: Family Medicine

## 2023-05-28 DIAGNOSIS — R7989 Other specified abnormal findings of blood chemistry: Secondary | ICD-10-CM

## 2023-05-28 NOTE — Progress Notes (Signed)
 Please inform patient  Testerone levels low referral placed to endocrinologist or patient can contact [facility] [place] [phone],  Address: [address] [place], [place] [address]

## 2023-05-30 LAB — TESTOSTERONE,FREE AND TOTAL
Testosterone, Free: 8.8 pg/mL (ref 6.6–18.1)
Testosterone: 257 ng/dL — ABNORMAL LOW (ref 264–916)

## 2023-05-30 LAB — VITAMIN B12: Vitamin B-12: 855 pg/mL (ref 232–1245)

## 2023-05-30 LAB — VITAMIN D 25 HYDROXY (VIT D DEFICIENCY, FRACTURES): Vit D, 25-Hydroxy: 45 ng/mL (ref 30.0–100.0)

## 2023-06-10 ENCOUNTER — Ambulatory Visit: Payer: Medicare Other | Attending: Internal Medicine | Admitting: Internal Medicine

## 2023-06-10 ENCOUNTER — Encounter: Payer: Self-pay | Admitting: Internal Medicine

## 2023-06-10 ENCOUNTER — Other Ambulatory Visit (HOSPITAL_COMMUNITY)
Admission: RE | Admit: 2023-06-10 | Discharge: 2023-06-10 | Disposition: A | Discharge status: Home / Self Care | Source: Ambulatory Visit | Attending: Internal Medicine | Admitting: Internal Medicine

## 2023-06-10 VITALS — BP 128/66 | HR 86 | Ht 72.0 in | Wt 274.0 lb

## 2023-06-10 DIAGNOSIS — N2889 Other specified disorders of kidney and ureter: Secondary | ICD-10-CM

## 2023-06-10 DIAGNOSIS — K921 Melena: Secondary | ICD-10-CM | POA: Diagnosis not present

## 2023-06-10 DIAGNOSIS — I25119 Atherosclerotic heart disease of native coronary artery with unspecified angina pectoris: Secondary | ICD-10-CM

## 2023-06-10 HISTORY — DX: Other specified disorders of kidney and ureter: N28.89

## 2023-06-10 LAB — CBC
HCT: 52.2 % — ABNORMAL HIGH (ref 39.0–52.0)
Hemoglobin: 17 g/dL (ref 13.0–17.0)
MCH: 28.9 pg (ref 26.0–34.0)
MCHC: 32.6 g/dL (ref 30.0–36.0)
MCV: 88.6 fL (ref 80.0–100.0)
Platelets: 183 10*3/uL (ref 150–400)
RBC: 5.89 MIL/uL — ABNORMAL HIGH (ref 4.22–5.81)
RDW: 15.9 % — ABNORMAL HIGH (ref 11.5–15.5)
WBC: 9.6 10*3/uL (ref 4.0–10.5)
nRBC: 0 % (ref 0.0–0.2)

## 2023-06-10 NOTE — Patient Instructions (Signed)
 Medication Instructions:  Your physician recommends that you continue on your current medications as directed. Please refer to the Current Medication list given to you today.  *If you need a refill on your cardiac medications before your next appointment, please call your pharmacy*  Lab Work: TODAY- CBC  If you have labs (blood work) drawn today and your tests are completely normal, you will receive your results only by: MyChart Message (if you have MyChart) OR A paper copy in the mail If you have any lab test that is abnormal or we need to change your treatment, we will call you to review the results.  Testing/Procedures: None  Follow-Up: At [facility], you and your health needs are our priority.  As part of our continuing mission to provide you with exceptional heart care, our providers are all part of one team.  This team includes your primary Cardiologist (physician) and Advanced Practice Providers or APPs (Physician Assistants and Nurse Practitioners) who all work together to provide you with the care you need, when you need it.  Your next appointment:   6 month(s)  Provider:   To establish with [facility] Cardiology   We recommend signing up for the patient portal called "MyChart".  Sign up information is provided on this After Visit Summary.  MyChart is used to connect with patients for Virtual Visits (Telemedicine).  Patients are able to view lab/test results, encounter notes, upcoming appointments, etc.  Non-urgent messages can be sent to your provider as well.   To learn more about what you can do with MyChart, go to [email or web address].   Other Instructions

## 2023-06-10 NOTE — Progress Notes (Signed)
 Cardiology Office Note  Date: 06/10/2023   ID: Larz, Mark 11/29/1955, MRN [id number]  PCP:  Rosanna Comment, FNP  Cardiologist:  None Electrophysiologist:  None   History of Present Illness: JAYLEE LANTRY is a 68 y.o. male known to have CAD s/p LAD PCI in 2011, PCI to LAD ISR and D1 PCI in 2024 from accelerating angina on 2 different occasions, HLD, PFO s/p percutaneous closure in 2015 at [facility], DM 2, is here for follow-up visit.  He reported having no angina after his PCI in 2024.  He was having some intermittent chest pains but these are noncardiac in nature.  Not similar to index pain.  No DOE, dizziness, syncope, palpitations, leg swelling.  Compliant with medications.  He reported having black stool for the last 2 to 3 months and abdominal pain.  I do not have any CBC in the last couple of months.  Past Medical History:  Diagnosis Date   (HFpEF) heart failure with preserved ejection fraction (HCC)    a. 06/2016 Echo: Ef >55%, gr1 DD, dil RV w/ nl syst fxn, mildly dil LA.   CAD (coronary artery disease)    a. s/p stenting x 2 (RCA/LCX)- [place], [place]; b. 2014 Cath: mild RCA/LCX ISR.   Depression    Diabetes mellitus without complication (HCC)    Headache    Hypercholesteremia    Hypertension    Morbid obesity (HCC)    OSA (obstructive sleep apnea)    PFO (patent foramen ovale)    a. s/p percutaneous closure @ [facility], [facility]).   PUD (peptic ulcer disease)     Past Surgical History:  Procedure Laterality Date   CORONARY ANGIOPLASTY WITH STENT PLACEMENT     HERNIA REPAIR     JOINT REPLACEMENT Left    knee replacement   SHOULDER SURGERY     vetebral fusion      Current Outpatient Medications  Medication Sig Dispense Refill   albuterol (VENTOLIN HFA) 108 (90 Base) MCG/ACT inhaler Inhale 1 puff into the lungs every 4 (four) hours as needed for shortness of breath.     atorvastatin  (LIPITOR ) 40 MG tablet Take 1 tablet (40 mg total) by mouth daily.  90 tablet 3   clopidogrel  (PLAVIX ) 75 MG tablet Take 1 tablet (75 mg total) by mouth daily with breakfast. 90 tablet 3   Evolocumab  (REPATHA  SURECLICK) 140 MG/ML SOAJ Inject 140 mg into the skin every 14 (fourteen) days. 6 mL 1   gabapentin  (NEURONTIN ) 800 MG tablet Take 800 mg by mouth 3 (three) times daily.     glipiZIDE  (GLUCOTROL ) 10 MG tablet Take 1 tablet (10 mg total) by mouth 2 (two) times daily. 60 tablet 2   isosorbide  mononitrate (IMDUR ) 120 MG 24 hr tablet Take 1 tablet (120 mg total) by mouth daily. 90 tablet 3   JARDIANCE  10 MG TABS tablet Take 10 mg by mouth every morning.     lisinopril  (ZESTRIL ) 20 MG tablet Take 1 tablet (20 mg total) by mouth daily. 90 tablet 3   metFORMIN  (GLUCOPHAGE ) 500 MG tablet Take 1 tablet (500 mg total) by mouth 2 (two) times daily with a meal. 60 tablet 5   metoprolol  tartrate (LOPRESSOR ) 50 MG tablet Take 1 tablet (50 mg total) by mouth 2 (two) times daily. 180 tablet 3   nitroGLYCERIN  (NITROSTAT ) 0.4 MG SL tablet Place 1 tablet (0.4 mg total) under the tongue every 5 (five) minutes x 2 doses as needed for  chest pain. 30 tablet 3   oxyCODONE -acetaminophen  (PERCOCET) 10-325 MG tablet Take 1 tablet by mouth 3 (three) times daily as needed.     pantoprazole (PROTONIX) 40 MG tablet Take 40 mg by mouth daily.     ranolazine  (RANEXA ) 500 MG 12 hr tablet Take 500 mg by mouth 2 (two) times daily.     sertraline  (ZOLOFT ) 50 MG tablet Take 1 tablet (50 mg total) by mouth daily. 30 tablet 2   sucralfate  (CARAFATE ) 1 g tablet Take 1 tablet (1 g total) by mouth 2 (two) times daily with a meal. 60 tablet 2   testosterone  cypionate (DEPOTESTOSTERONE CYPIONATE) 200 MG/ML injection Inject into the muscle.     tiZANidine (ZANAFLEX) 4 MG tablet Take 4 mg by mouth every 12 (twelve) hours as needed.     torsemide  (DEMADEX ) 20 MG tablet Take 1 tablet (20 mg total) by mouth daily. 90 tablet 3   insulin  NPH Human (NOVOLIN N) 100 UNIT/ML injection Inject into the skin.  (Patient not taking: Reported on 03/20/2023)     magnesium oxide (MAG-OX) 400 MG tablet Take by mouth. (Patient not taking: Reported on 06/10/2023)     No current facility-administered medications for this visit.   Allergies:  Codeine   Social History: The patient  reports that he has quit smoking. His smoking use included cigarettes. His smokeless tobacco use includes chew. He reports that he does not drink alcohol and does not use drugs.   Family History: The patient's family history includes CAD in his father and mother; Hypertension in his mother.   ROS:  Please see the history of present illness. Otherwise, complete review of systems is positive for none.  All other systems are reviewed and negative.   Physical Exam: VS:  BP 128/66 (BP Location: Left Arm, Patient Position: Sitting, Cuff Size: Large)   Pulse 86   Ht 6' (1.829 m)   Wt 274 lb (124.3 kg)   SpO2 95%   BMI 37.16 kg/m , BMI Body mass index is 37.16 kg/m.  Wt Readings from Last 3 Encounters:  06/10/23 274 lb (124.3 kg)  05/27/23 291 lb 0.6 oz (132 kg)  03/20/23 292 lb 1.3 oz (132.5 kg)    General: Patient appears comfortable at rest. HEENT: Conjunctiva and lids normal, oropharynx clear with moist mucosa. Neck: Supple, no elevated JVP or carotid bruits, no thyromegaly. Lungs: Clear to auscultation, nonlabored breathing at rest. Cardiac: Regular rate and rhythm, no S3 or significant systolic murmur, no pericardial rub. Abdomen: Soft, nontender, no hepatomegaly, bowel sounds present, no guarding or rebound. Extremities: No pitting edema, distal pulses 2+. Skin: Warm and dry. Musculoskeletal: No kyphosis. Neuropsychiatric: Alert and oriented x3, affect grossly appropriate.  Recent Labwork: 03/24/2023: Hemoglobin 16.7; Platelets 234; TSH 1.430     Component Value Date/Time   CHOL 121 05/19/2023 0802   TRIG 186 (H) 05/19/2023 0802   HDL 27 (L) 05/19/2023 0802   CHOLHDL 4.5 05/19/2023 0802   CHOLHDL 5.2 11/22/2016  1048   VLDL 55 (H) 11/22/2016 1048   LDLCALC 63 05/19/2023 0802     Assessment and Plan:   Black stool: Obtain CBC.  CAD s/p LAD PCI in 2011, accelerating angina s/p PCI to LAD ISR, accelerating angina s/p D1 PCI and normal LVEF: No interval angina.  He has chest pains in the right side but these are noncardiac.  Continue Plavix  monotherapy, indefinitely.  He reported having black stools for the last 2 to 3 months and abdominal pain.  Not on iron supplements.  Obtain CBC.  Instructed him to reach out to his PCP for GI referral.  He bought a new house, is moving to [place] in the next couple of months, we will have him follow-up with [facility] cardiology.  HLD, at goal: Continue Repatha .  LDL 63 around 1 month ago.  S/p ASD closure at [facility] in 2015  Left renal mass on CT abdomen in October 2024: Report recommended MRI abdomen to characterize the mass.  Per primary.    Medication Adjustments/Labs and Tests Ordered: Current medicines are reviewed at length with the patient today.  Concerns regarding medicines are outlined above.    Disposition:  Follow up 6 months in [place].  Signed, Chauncey Bruno Priya Natania Finigan, MD, 06/10/2023 1:54 PM    [facility] at [facility] [address], [place], [place] [address]

## 2023-06-14 ENCOUNTER — Other Ambulatory Visit: Payer: Self-pay | Admitting: Family Medicine

## 2023-06-16 LAB — COLOGUARD: COLOGUARD: NEGATIVE

## 2023-06-19 ENCOUNTER — Ambulatory Visit (HOSPITAL_COMMUNITY)
Admission: RE | Admit: 2023-06-19 | Discharge: 2023-06-19 | Disposition: A | Discharge status: Home / Self Care | Source: Ambulatory Visit | Attending: Family Medicine

## 2023-06-19 DIAGNOSIS — R1084 Generalized abdominal pain: Secondary | ICD-10-CM | POA: Diagnosis present

## 2023-06-22 ENCOUNTER — Encounter: Payer: Self-pay | Admitting: Family Medicine

## 2023-07-08 ENCOUNTER — Other Ambulatory Visit: Payer: Self-pay | Admitting: Family Medicine

## 2023-07-16 ENCOUNTER — Encounter: Payer: Self-pay | Admitting: Family Medicine

## 2023-07-16 ENCOUNTER — Ambulatory Visit (INDEPENDENT_AMBULATORY_CARE_PROVIDER_SITE_OTHER): Payer: Medicare Other | Admitting: Family Medicine

## 2023-07-16 VITALS — BP 137/84

## 2023-07-16 DIAGNOSIS — E1159 Type 2 diabetes mellitus with other circulatory complications: Secondary | ICD-10-CM | POA: Diagnosis not present

## 2023-07-16 DIAGNOSIS — Z7984 Long term (current) use of oral hypoglycemic drugs: Secondary | ICD-10-CM | POA: Diagnosis not present

## 2023-07-16 DIAGNOSIS — Z7689 Persons encountering health services in other specified circumstances: Secondary | ICD-10-CM

## 2023-07-16 DIAGNOSIS — E119 Type 2 diabetes mellitus without complications: Secondary | ICD-10-CM

## 2023-07-16 DIAGNOSIS — Z125 Encounter for screening for malignant neoplasm of prostate: Secondary | ICD-10-CM

## 2023-07-16 DIAGNOSIS — N4281 Prostatodynia syndrome: Secondary | ICD-10-CM

## 2023-07-16 DIAGNOSIS — I10 Essential (primary) hypertension: Secondary | ICD-10-CM | POA: Diagnosis not present

## 2023-07-16 DIAGNOSIS — Z1211 Encounter for screening for malignant neoplasm of colon: Secondary | ICD-10-CM

## 2023-07-16 MED ORDER — LISINOPRIL 20 MG PO TABS: 20.0000 mg | ORAL_TABLET | Freq: Every day | ORAL | 3 refills | Status: DC

## 2023-07-16 MED ORDER — SERTRALINE HCL 50 MG PO TABS: 50.0000 mg | ORAL_TABLET | Freq: Every day | ORAL | 2 refills | Status: DC

## 2023-07-16 MED ORDER — TORSEMIDE 20 MG PO TABS: 20.0000 mg | ORAL_TABLET | Freq: Every day | ORAL | 3 refills | Status: DC

## 2023-07-16 MED ORDER — CLONIDINE HCL 0.1 MG PO TABS: 0.1000 mg | ORAL_TABLET | Freq: Two times a day (BID) | ORAL | 3 refills | Status: DC | PRN

## 2023-07-16 MED ORDER — METOPROLOL TARTRATE 50 MG PO TABS: 50.0000 mg | ORAL_TABLET | Freq: Two times a day (BID) | ORAL | 3 refills | Status: AC

## 2023-07-16 NOTE — Progress Notes (Signed)
 Established Patient Office Visit   Subjective  Patient ID: Benjamin Bush, male    DOB: 03-04-1955  Age: 68 y.o. MRN: [id number]  Chief Complaint  Patient presents with   Diabetes    He  has a past medical history of (HFpEF) heart failure with preserved ejection fraction (HCC), CAD (coronary artery disease), Depression, Diabetes mellitus without complication (HCC), Headache, Hypercholesteremia, Hypertension, Morbid obesity (HCC), OSA (obstructive sleep apnea), PFO (patent foramen ovale), and PUD (peptic ulcer disease).  HPI Patient presents to the clinic for chronic follow up. For the details of today's visit, please refer to assessment and plan.   Review of Systems  Constitutional:  Negative for chills and fever.  Respiratory:  Negative for shortness of breath.   Cardiovascular:  Negative for chest pain.  Neurological:  Positive for dizziness.      Objective:     BP 137/84  BP Readings from Last 3 Encounters:  07/16/23 137/84  06/10/23 128/66  05/27/23 134/77      Physical Exam Vitals reviewed.  Constitutional:      General: He is not in acute distress.    Appearance: Normal appearance. He is not ill-appearing, toxic-appearing or diaphoretic.  HENT:     Head: Normocephalic.  Eyes:     General:        Right eye: No discharge.        Left eye: No discharge.     Conjunctiva/sclera: Conjunctivae normal.  Cardiovascular:     Rate and Rhythm: Normal rate.     Pulses: Normal pulses.     Heart sounds: Normal heart sounds.  Pulmonary:     Effort: Pulmonary effort is normal. No respiratory distress.     Breath sounds: Normal breath sounds.  Abdominal:     General: Bowel sounds are normal.     Palpations: Abdomen is soft.     Tenderness: There is no abdominal tenderness. There is no right CVA tenderness, left CVA tenderness or guarding.  Skin:    Capillary Refill: Capillary refill takes less than 2 seconds.  Neurological:     Mental Status: He is alert.      Coordination: Coordination normal.     Gait: Gait normal.  Psychiatric:        Mood and Affect: Mood normal.        Behavior: Behavior normal.      No results found for any visits on 07/16/23.  The ASCVD Risk score (Arnett DK, et al., 2019) failed to calculate for the following reasons:   Risk score cannot be calculated because patient has a medical history suggesting prior/existing ASCVD    Assessment & Plan:  Prostate pain -     Ambulatory referral to Urology  Primary hypertension -     Hemoglobin A1c -     BMP8+eGFR -     CBC with Differential/Platelet  Encounter for screening prostate specific antigen (PSA) measurement -     PSA  Referral of patient -     Ambulatory referral to Cardiology  Type 2 diabetes mellitus without complication, without long-term current use of insulin  (HCC) Assessment & Plan: Last Hemoglobin A1c: 7.9 Labs: Ordered today, results pending; will follow up accordingly. The patient reports adhering to prescribed medications: Patient reports, glipizide  10 twice daily, Jardiance  10 mg once daily, Metformin  500 mg 2 times daily   Reviewed non-pharmacological interventions, including a balanced diet rich in lean proteins, healthy fats, whole grains, and high-fiber vegetables. Emphasized reducing refined sugars and processed  carbohydrates, and incorporating more fruits, leafy greens, and legumes. Education: Patient was educated on recognizing signs and symptoms of both hypoglycemia and hyperglycemia, and advised to seek emergency care if these symptoms occur. Follow-Up: Scheduled for follow-up in 3-4 months, or sooner if needed. Patient Understanding: The patient verbalized understanding of the care plan, and all questions were answered. Additional Care: Ophthalmology referral was placed. Foot exam results were within normal limits  Orders: -     Microalbumin / creatinine urine ratio -     Ambulatory referral to Ophthalmology  Screen for colon  cancer -     Cologuard  Other orders -     Sertraline  HCl; Take 1 tablet (50 mg total) by mouth daily.  Dispense: 30 tablet; Refill: 2 -     Lisinopril ; Take 1 tablet (20 mg total) by mouth daily.  Dispense: 90 tablet; Refill: 3 -     Metoprolol  Tartrate; Take 1 tablet (50 mg total) by mouth 2 (two) times daily.  Dispense: 180 tablet; Refill: 3 -     Torsemide ; Take 1 tablet (20 mg total) by mouth daily.  Dispense: 90 tablet; Refill: 3 -     cloNIDine  HCl; Take 1 tablet (0.1 mg total) by mouth 2 (two) times daily as needed. Take one tablet if B/P above 160/100  Dispense: 30 tablet; Refill: 3    Return if symptoms worsen or fail to improve.   Avelino Lek Amber Bail, FNP

## 2023-07-16 NOTE — Assessment & Plan Note (Signed)
 Last Hemoglobin A1c: 7.9 Labs: Ordered today, results pending; will follow up accordingly. The patient reports adhering to prescribed medications: Patient reports, glipizide  10 twice daily, Jardiance  10 mg once daily, Metformin  500 mg 2 times daily   Reviewed non-pharmacological interventions, including a balanced diet rich in lean proteins, healthy fats, whole grains, and high-fiber vegetables. Emphasized reducing refined sugars and processed carbohydrates, and incorporating more fruits, leafy greens, and legumes. Education: Patient was educated on recognizing signs and symptoms of both hypoglycemia and hyperglycemia, and advised to seek emergency care if these symptoms occur. Follow-Up: Scheduled for follow-up in 3-4 months, or sooner if needed. Patient Understanding: The patient verbalized understanding of the care plan, and all questions were answered. Additional Care: Ophthalmology referral was placed. Foot exam results were within normal limits

## 2023-07-16 NOTE — Patient Instructions (Addendum)
 [place] [facility] at [facility] [phone]  [facility] at [facility] [phone]  [address] [place], [address]    Referral placed to transfer care Cardiology:  [facility] at [facility] Address: [address] [place], [place] [address] Phone: [phone]

## 2023-07-17 ENCOUNTER — Ambulatory Visit: Payer: Self-pay | Admitting: Family Medicine

## 2023-07-17 LAB — BMP8+EGFR
BUN/Creatinine Ratio: 22 (ref 10–24)
BUN: 31 mg/dL — ABNORMAL HIGH (ref 8–27)
CO2: 23 mmol/L (ref 20–29)
Calcium: 9.5 mg/dL (ref 8.6–10.2)
Chloride: 100 mmol/L (ref 96–106)
Creatinine, Ser: 1.39 mg/dL — ABNORMAL HIGH (ref 0.76–1.27)
Glucose: 113 mg/dL — ABNORMAL HIGH (ref 70–99)
Potassium: 4.4 mmol/L (ref 3.5–5.2)
Sodium: 143 mmol/L (ref 134–144)
eGFR: 56 mL/min/{1.73_m2} — ABNORMAL LOW (ref >59)

## 2023-07-17 LAB — CBC WITH DIFFERENTIAL/PLATELET
Basophils Absolute: 0 10*3/uL (ref 0.0–0.2)
Basos: 0 %
EOS (ABSOLUTE): 0.2 10*3/uL (ref 0.0–0.4)
Eos: 2 %
Hematocrit: 49.5 % (ref 37.5–51.0)
Hemoglobin: 16.4 g/dL (ref 13.0–17.7)
Immature Grans (Abs): 0 10*3/uL (ref 0.0–0.1)
Immature Granulocytes: 0 %
Lymphocytes Absolute: 1.5 10*3/uL (ref 0.7–3.1)
Lymphs: 20 %
MCH: 29.4 pg (ref 26.6–33.0)
MCHC: 33.1 g/dL (ref 31.5–35.7)
MCV: 89 fL (ref 79–97)
Monocytes Absolute: 0.6 10*3/uL (ref 0.1–0.9)
Monocytes: 7 %
Neutrophils Absolute: 5.1 10*3/uL (ref 1.4–7.0)
Neutrophils: 71 %
Platelets: 174 10*3/uL (ref 150–450)
RBC: 5.57 x10E6/uL (ref 4.14–5.80)
RDW: 14.8 % (ref 11.6–15.4)
WBC: 7.4 10*3/uL (ref 3.4–10.8)

## 2023-07-17 LAB — HEMOGLOBIN A1C
Est. average glucose Bld gHb Est-mCnc: 140 mg/dL
Hgb A1c MFr Bld: 6.5 % — ABNORMAL HIGH (ref 4.8–5.6)

## 2023-07-17 LAB — PSA: Prostate Specific Ag, Serum: 0.5 ng/mL (ref 0.0–4.0)

## 2023-07-29 ENCOUNTER — Telehealth: Payer: Self-pay

## 2023-07-29 NOTE — Telephone Encounter (Signed)
 Patient needs new office to request our records for transfer

## 2023-07-29 NOTE — Telephone Encounter (Signed)
 Copied from CRM [id number]. Topic: Appointments - Transfer of Care >> Jul 29, 2023 10:15 AM Clyde Darling P wrote: Pt is requesting to transfer FROM: Kara Orts Polanco Pt is requesting to transfer TO: Trenton Frock Reason for requested transfer: [place] refer Pt to have PCP It is the responsibility of the team the patient would like to transfer to (Dr. Donnia Galas) to reach out to the patient if for any reason this transfer is not acceptable.

## 2023-08-10 ENCOUNTER — Other Ambulatory Visit: Payer: Self-pay | Admitting: Family Medicine

## 2023-08-13 ENCOUNTER — Ambulatory Visit: Admitting: "Endocrinology

## 2023-08-18 ENCOUNTER — Ambulatory Visit (INDEPENDENT_AMBULATORY_CARE_PROVIDER_SITE_OTHER): Admitting: Physician Assistant

## 2023-08-18 ENCOUNTER — Encounter: Payer: Self-pay | Admitting: Physician Assistant

## 2023-08-18 VITALS — BP 109/62 | HR 81 | Ht 72.0 in | Wt 258.0 lb

## 2023-08-18 DIAGNOSIS — Z8774 Personal history of (corrected) congenital malformations of heart and circulatory system: Secondary | ICD-10-CM

## 2023-08-18 DIAGNOSIS — I5032 Chronic diastolic (congestive) heart failure: Secondary | ICD-10-CM

## 2023-08-18 DIAGNOSIS — F339 Major depressive disorder, recurrent, unspecified: Secondary | ICD-10-CM

## 2023-08-18 DIAGNOSIS — I10 Essential (primary) hypertension: Secondary | ICD-10-CM

## 2023-08-18 DIAGNOSIS — N183 Chronic kidney disease, stage 3 unspecified: Secondary | ICD-10-CM

## 2023-08-18 DIAGNOSIS — K429 Umbilical hernia without obstruction or gangrene: Secondary | ICD-10-CM

## 2023-08-18 DIAGNOSIS — Z794 Long term (current) use of insulin: Secondary | ICD-10-CM

## 2023-08-18 DIAGNOSIS — G4733 Obstructive sleep apnea (adult) (pediatric): Secondary | ICD-10-CM

## 2023-08-18 DIAGNOSIS — E1122 Type 2 diabetes mellitus with diabetic chronic kidney disease: Secondary | ICD-10-CM | POA: Diagnosis not present

## 2023-08-18 DIAGNOSIS — I25119 Atherosclerotic heart disease of native coronary artery with unspecified angina pectoris: Secondary | ICD-10-CM | POA: Diagnosis not present

## 2023-08-18 DIAGNOSIS — R7989 Other specified abnormal findings of blood chemistry: Secondary | ICD-10-CM | POA: Diagnosis not present

## 2023-08-18 DIAGNOSIS — Z955 Presence of coronary angioplasty implant and graft: Secondary | ICD-10-CM

## 2023-08-18 NOTE — Assessment & Plan Note (Signed)
 Euvolemic today. Imdur , and torsemide  20 mg daily

## 2023-08-18 NOTE — Assessment & Plan Note (Signed)
 Now feeling some orthostatic symptoms. Recommending cut lisinopril  20 mg to 10 mg daily.  Referring back to cardiology Continue metoprolol  50 mg bid, imdur . Pt reserves clonidine  for HTN episodes, which he has not had for a long time.  Reviewed cmp

## 2023-08-18 NOTE — Assessment & Plan Note (Signed)
 No longer uses cpap machine. S/p significant weight loss Could consider repeat sleep study

## 2023-08-18 NOTE — Assessment & Plan Note (Signed)
 S/p stent placement.  Plavix , lipitor , repatha 

## 2023-08-18 NOTE — Progress Notes (Signed)
 New patient visit   Patient: Benjamin Bush   DOB: Mar 03, 1955   68 y.o. Male  MRN: [id number] Visit Date: 08/18/2023  Today's healthcare provider: Manuelita Flatness, PA-C   Cc. New pt establish care  Subjective    Benjamin Bush is a 68 y.o. male who presents today as a new patient to establish care.   He has a PMH of CHF w/ preserved EF, CAD, depression, DM2, HLD, HTN.  Discussed the use of AI scribe software for clinical note transcription with the patient, who gave verbal consent to proceed.  History of Present Illness   Benjamin Bush is a 69 year old male who presents to establish care, he is accompanied by his wife.  He experiences dizziness, especially when bending over and standing up. He has lost significant weight, from 335 pounds in November to 258 pounds currently, which may affect his medication needs.  He has adjusted his diet to include more chicken, [place], fish, and salads, with occasional red meat.  He experiences neuropathy with numbness and tingling in his feet and occasional tingling in his arms. He previously used gabapentin  but has stopped.  He has a history of COPD and OSA which has improved with weight loss. He no longer uses a CPAP machine for sleep apnea and reports improved breathing and reduced snoring. Pt reports he used to be O2 dependent and is no longer.  He has undergone five hernia surgeries and currently has a hernia causing discomfort and affecting bowel movements, but denies pain, or change in size.       Past Medical History:  Diagnosis Date   (HFpEF) heart failure with preserved ejection fraction (HCC)    a. 06/2016 Echo: Ef >55%, gr1 DD, dil RV w/ nl syst fxn, mildly dil LA.   CAD (coronary artery disease)    a. s/p stenting x 2 (RCA/LCX)- [place], [place]; b. 2014 Cath: mild RCA/LCX ISR.   Depression    Diabetes mellitus without complication (HCC)    Headache    Hypercholesteremia    Hypertension    Morbid obesity (HCC)    OSA  (obstructive sleep apnea)    PFO (patent foramen ovale)    a. s/p percutaneous closure @ [name]).   PUD (peptic ulcer disease)    Unstable angina (HCC) 02/24/2013   Past Surgical History:  Procedure Laterality Date   CORONARY ANGIOPLASTY WITH STENT PLACEMENT     HERNIA REPAIR     JOINT REPLACEMENT Left    knee replacement   SHOULDER SURGERY     vetebral fusion     Family Status  Relation Name Status   Mother  (Not Specified)   Father  (Not Specified)  No partnership data on file   Family History  Problem Relation Age of Onset   CAD Mother        MI in her 34s   Hypertension Mother    CAD Father        CABG in his 21s   Social History   Socioeconomic History   Marital status: Divorced    Spouse name: Not on file   Number of children: 2   Years of education: Not on file   Highest education level: High school graduate  Occupational History    Comment: Disabled  Tobacco Use   Smoking status: Former    Types: Cigarettes   Smokeless tobacco: Current    Types: Chew   Tobacco comments:  Chews tobacco  Vaping Use   Vaping status: Never Used  Substance and Sexual Activity   Alcohol use: No   Drug use: No   Sexual activity: Yes  Other Topics Concern   Not on file  Social History Narrative   Not on file   Social Drivers of Health   Financial Resource Strain: Medium Risk (11/08/2021)   Received from [facility]   Overall Financial Resource Strain (CARDIA)    Difficulty of Paying Living Expenses: Somewhat hard  Food Insecurity: No Food Insecurity (11/08/2021)   Received from [facility]   Hunger Vital Sign    Within the past 12 months, you worried that your food would run out before you got the money to buy more.: Never true    Within the past 12 months, the food you bought just didn't last and you didn't have money to get more.: Never true  Transportation Needs: No Transportation Needs (09/16/2022)   Received from [facility] - Transportation    Lack of Transportation (Medical): No    Lack of Transportation (Non-Medical): No  Physical Activity: Unknown (03/22/2019)   Received from [facility]   Exercise Vital Sign    On average, how many days per week do you engage in moderate to strenuous exercise (like a brisk walk)?: Patient declined    On average, how many minutes do you engage in exercise at this level?: Patient declined  Stress: Unknown (03/22/2019)   Received from [facility] - Occupational Stress Questionnaire    Feeling of Stress : Patient declined  Social Connections: Unknown (03/22/2019)   Received from [facility]   Social Connection and Isolation Panel    In a typical week, how many times do you talk on the phone with family, friends, or neighbors?: Patient declined    How often do you get together with friends or relatives?: Patient declined    How often do you attend church or religious services?: Patient declined    Do you belong to any clubs or organizations such as church groups, unions, fraternal or athletic groups, or school groups?: Patient declined    How often do you attend meetings of the clubs or organizations you belong to?: Patient declined    Are you married, widowed, divorced, separated, never married, or living with a partner?: Patient declined   Outpatient Medications Prior to Visit  Medication Sig   atorvastatin  (LIPITOR ) 40 MG tablet Take 1 tablet (40 mg total) by mouth daily.   cloNIDine  (CATAPRES ) 0.1 MG tablet Take 1 tablet (0.1 mg total) by mouth 2 (two) times daily as needed. Take one tablet if B/P above 160/100 (Patient not taking: Reported on 08/18/2023)   clopidogrel  (PLAVIX ) 75 MG tablet Take 1 tablet (75 mg total) by mouth daily with breakfast.   Evolocumab  (REPATHA  SURECLICK) 140 MG/ML SOAJ Inject 140 mg into the skin every 14 (fourteen) days.   isosorbide  mononitrate (IMDUR ) 120 MG 24 hr tablet Take 1 tablet  (120 mg total) by mouth daily.   JARDIANCE  10 MG TABS tablet TAKE 1 TABLET BY MOUTH ONCE DAILY BEFORE BREAKFAST   lisinopril  (ZESTRIL ) 20 MG tablet Take 1 tablet (20 mg total) by mouth daily.   metFORMIN  (GLUCOPHAGE ) 500 MG tablet Take 1 tablet (500 mg total) by mouth 2 (two) times daily with a meal.   metoprolol  tartrate (LOPRESSOR ) 50 MG tablet Take 1 tablet (50 mg total)  by mouth 2 (two) times daily.   nitroGLYCERIN  (NITROSTAT ) 0.4 MG SL tablet Place 1 tablet (0.4 mg total) under the tongue every 5 (five) minutes x 2 doses as needed for chest pain.   oxyCODONE -acetaminophen  (PERCOCET) 10-325 MG tablet Take 1 tablet by mouth 3 (three) times daily as needed.   ranolazine  (RANEXA ) 500 MG 12 hr tablet Take 500 mg by mouth 2 (two) times daily.   sertraline  (ZOLOFT ) 50 MG tablet Take 1 tablet (50 mg total) by mouth daily.   sucralfate  (CARAFATE ) 1 g tablet TAKE 1 TABLET BY MOUTH TWICE DAILY WITH A MEAL   testosterone  cypionate (DEPOTESTOSTERONE CYPIONATE) 200 MG/ML injection Inject into the muscle. (Patient not taking: Reported on 08/18/2023)   tiZANidine (ZANAFLEX) 4 MG tablet Take 4 mg by mouth every 12 (twelve) hours as needed.   torsemide  (DEMADEX ) 20 MG tablet Take 1 tablet (20 mg total) by mouth daily.   [DISCONTINUED] albuterol (VENTOLIN HFA) 108 (90 Base) MCG/ACT inhaler Inhale 1 puff into the lungs every 4 (four) hours as needed for shortness of breath.   [DISCONTINUED] gabapentin  (NEURONTIN ) 800 MG tablet Take 800 mg by mouth 3 (three) times daily.   [DISCONTINUED] glipiZIDE  (GLUCOTROL ) 10 MG tablet Take 1 tablet by mouth twice daily   [DISCONTINUED] insulin  NPH Human (NOVOLIN N) 100 UNIT/ML injection Inject into the skin. (Patient not taking: Reported on 07/16/2023)   [DISCONTINUED] magnesium oxide (MAG-OX) 400 MG tablet Take by mouth. (Patient not taking: Reported on 03/04/2023)   No facility-administered medications prior to visit.   Allergies  Allergen Reactions   Codeine Swelling     Immunization History  Administered Date(s) Administered   Fluzone Influenza virus vaccine,trivalent (IIV3), split virus 11/16/2012, 10/25/2014, 11/19/2015, 12/10/2016, 10/20/2017, 10/20/2018, 11/15/2019, 12/17/2020   Influenza, Seasonal, Injecte, Preservative Fre 04/24/2012   Influenza,inj,Quad PF,6+ Mos 11/09/2021   Influenza-Unspecified 02/18/2016, 11/22/2018, 11/16/2022   Moderna Sars-Covid-2 Vaccination 06/01/2019   PNEUMOCOCCAL CONJUGATE-20 11/17/2022   Pfizer(Comirnaty)Fall Seasonal Vaccine 12 years and older 11/17/2022   Pneumococcal Conjugate-13 11/17/2016   Pneumococcal Polysaccharide-23 11/16/2012   RSV,unspecified 01/01/2022   Tdap 04/07/2022   Zoster Recombinant(Shingrix) 01/01/2022, 11/16/2022    Health Maintenance  Topic Date Due   Medicare Annual Wellness (AWV)  Never done   OPHTHALMOLOGY EXAM  Never done   Diabetic kidney evaluation - Urine ACR  Never done   Hepatitis C Screening  Never done   Colonoscopy  Never done   COVID-19 Vaccine (3 - Mixed Product risk series) 12/15/2022   INFLUENZA VACCINE  09/18/2023   HEMOGLOBIN A1C  01/16/2024   Diabetic kidney evaluation - eGFR measurement  07/15/2024   FOOT EXAM  07/15/2024   DTaP/Tdap/Td (2 - Td or Tdap) 04/07/2032   Pneumococcal Vaccine: 50+ Years  Completed   Zoster Vaccines- Shingrix  Completed   Hepatitis B Vaccines  Aged Out   HPV VACCINES  Aged Out   Meningococcal B Vaccine  Aged Out    Patient Care Team: Cyndi Shaver, PA-C as PCP - General (Physician Assistant)  Review of Systems  Constitutional:  Negative for fatigue and fever.  Respiratory:  Negative for cough and shortness of breath.   Cardiovascular:  Negative for chest pain, palpitations and leg swelling.  Neurological:  Negative for dizziness and headaches.        Objective    BP 109/62   Pulse 81   Ht 6' (1.829 m)   Wt 258 lb (117 kg)   SpO2 97%   BMI 34.99 kg/m  Physical Exam Constitutional:      General: He is  awake.     Appearance: He is well-developed.  HENT:     Head: Normocephalic.   Eyes:     Conjunctiva/sclera: Conjunctivae normal.    Cardiovascular:     Rate and Rhythm: Normal rate and regular rhythm.     Heart sounds: Normal heart sounds.  Pulmonary:     Effort: Pulmonary effort is normal.     Breath sounds: Normal breath sounds.  Abdominal:     Comments: Umbilical hernia, reducible soft with no skin changes   Musculoskeletal:     Right lower leg: No edema.     Left lower leg: No edema.   Skin:    General: Skin is warm.   Neurological:     Mental Status: He is alert and oriented to person, place, and time.   Psychiatric:        Attention and Perception: Attention normal.        Mood and Affect: Mood normal.        Speech: Speech normal.        Behavior: Behavior is cooperative.    Depression Screen    05/27/2023    8:54 AM 03/20/2023    9:23 AM  PHQ 2/9 Scores  PHQ - 2 Score 0 0  PHQ- 9 Score 6 1   No results found for any visits on 08/18/23.  Assessment & Plan     Type 2 diabetes mellitus with stage 3 chronic kidney disease, with long-term current use of insulin , unspecified whether stage 3a or 3b CKD (HCC) Assessment & Plan: Last A1c 6.5% On jardiance  10 mg daily metformin  500 mg bid.  Has not checked bs since he has moved. Encouraged to monitor. On statin, ace.  Has dm neuropathy, used to take gabapentin  now manageable without medication, dm CKD. Goal is A1c < 7%.  F/b DM 2 mo  Orders: -     Ambulatory referral to Endocrinology -     Microalbumin / creatinine urine ratio  Low testosterone  -     Ambulatory referral to Endocrinology  Primary hypertension Assessment & Plan: Now feeling some orthostatic symptoms. Recommending cut lisinopril  20 mg to 10 mg daily.  Referring back to cardiology Continue metoprolol  50 mg bid, imdur . Pt reserves clonidine  for HTN episodes, which he has not had for a long time.  Reviewed cmp   Coronary artery disease  involving native coronary artery of native heart with angina pectoris [facility]) Assessment & Plan: S/p stent placement.  Plavix , lipitor , repatha   Orders: -     Ambulatory referral to Cardiology  Chronic diastolic CHF (congestive heart failure) (HCC) Assessment & Plan: Euvolemic today. Imdur , and torsemide  20 mg daily  Orders: -     Ambulatory referral to Cardiology  History of coronary artery stent placement -     Ambulatory referral to Cardiology  Hx of percutaneous transcatheter closure of congenital ASD -     Ambulatory referral to Cardiology  OSA (obstructive sleep apnea) Assessment & Plan: No longer uses cpap machine. S/p significant weight loss Could consider repeat sleep study   Recurrent major depressive disorder, remission status unspecified (HCC) Assessment & Plan: Stable with zoloft  50 mg daily   Umbilical hernia without obstruction and without gangrene   Recurrent hernia. Reducible and not causing significant pain or obstruction. Prefers conservative management unless symptoms worsen.     Return in about 2 months (around 10/19/2023) for DMII.     Manuelita  Cyndi RIGGERS  [facility] [phone] (phone) [phone] (fax)  [facility]

## 2023-08-18 NOTE — Assessment & Plan Note (Signed)
 Stable with zoloft  50 mg daily

## 2023-08-18 NOTE — Assessment & Plan Note (Addendum)
 Last A1c 6.5% On jardiance  10 mg daily metformin  500 mg bid.  Has not checked bs since he has moved. Encouraged to monitor. On statin, ace.  Has dm neuropathy, used to take gabapentin  now manageable without medication, dm CKD. Goal is A1c < 7%.  F/b DM 2 mo

## 2023-08-19 ENCOUNTER — Ambulatory Visit: Payer: Self-pay | Admitting: Physician Assistant

## 2023-08-19 LAB — MICROALBUMIN / CREATININE URINE RATIO
Creatinine,U: 43.9 mg/dL
Microalb Creat Ratio: 61 mg/g — ABNORMAL HIGH (ref 0.0–30.0)
Microalb, Ur: 2.7 mg/dL — ABNORMAL HIGH (ref 0.0–1.9)

## 2023-08-24 ENCOUNTER — Ambulatory Visit: Payer: Self-pay

## 2023-08-24 NOTE — Telephone Encounter (Signed)
 Called pt and left vm encouraging him to go to the ED.

## 2023-08-24 NOTE — Telephone Encounter (Signed)
 FYI Only or Action Required?: Action required by provider: refusing ED, requesting call back.  Patient was last seen in primary care on 07/16/2023 by Terry Wilhelmena Lloyd Hilario, FNP. Called Nurse Triage reporting Dizziness and Chest Pain. Symptoms began several weeks ago. Interventions attempted: Prescription medications: lisinopril  decreased dose per PCP. Symptoms are: not improving, did not discuss chest pain with provider at recent visit.  Triage Disposition: Go to ED Now (Notify PCP)  Patient/caregiver understands and will follow disposition?: No      Copied from CRM [id number]. Topic: Clinical - Red Word Triage >> Aug 24, 2023 12:15 PM [place] E wrote: [facility] that prompted transfer to Nurse Triage: Patient has been very dizzy in the mornings after he takes his medicine, been going on since the middle of June. Reason for Disposition  Dizziness or lightheadedness  Answer Assessment - Initial Assessment Questions 1. DESCRIPTION: Describe your dizziness.     When first get up not dizzy then take meds then after that start feeling dizzy headed, took me off of one of BP meds lisinopril  on 7/1, dizziness not changed, took BP this morning 130/72 this morning 2. LIGHTHEADED: Do you feel lightheaded? (e.g., somewhat faint, woozy, weak upon standing)     Woozy and weak upon standing 4. SEVERITY: How bad is it?  Do you feel like you are going to faint? Can you stand and walk?   - MILD: Feels slightly dizzy, but walking normally.   - MODERATE: Feels unsteady when walking, but not falling; interferes with normal activities (e.g., school, work).   - SEVERE: Unable to walk without falling, or requires assistance to walk without falling; feels like passing out now.      Unsteady while walking, no assistance needed 5. ONSET:  When did the dizziness begin?     Middle of June 6. AGGRAVATING FACTORS: Does anything make it worse? (e.g., standing, change in head position)     Every time bend  over, and like when you're drunk, dizzy, don't have my balance 7. HEART RATE: Can you tell me your heart rate? How many beats in 15 seconds?  (Note: not all patients can do this)       Sometimes my heart feels tight feeling in my chest, not for longer than 5 min at a time, comes on and goes away but still dizzy, no SOB 10. OTHER SYMPTOMS: Do you have any other symptoms? (e.g., fever, chest pain, vomiting, diarrhea, bleeding)       No fever, vomiting, diarrhea, or bleeding Tight feeling in chest, lay down and sleep an hour then fine for a while, for the past week or 2, don't think discussed with PA, put in referral for heart doc though No headache or vision changes Lost a lot of weight 335 lbs now 250 lbs, maybe medicine too strong Thought maybe meds too strong since lost lot of weight, doc lowered dose but still dizzy  Have CHF, no worsening swelling lately, constantly peeing to keep fluid off my heart  Answer Assessment - Initial Assessment Questions 1. LOCATION: Where does it hurt?       Heart feels tight in chest 3. ONSET: When did the chest pain begin? (Minutes, hours or days)      Last week or 2 4. PATTERN: Does the pain come and go, or has it been constant since it started?  Does it get worse with exertion?      Comes and goes, better with rest 5. DURATION: How long does  it last (e.g., seconds, minutes, hours)     Brief, not longer than 5 min at a times 6. SEVERITY: How bad is the pain?  (e.g., Scale 1-10; mild, moderate, or severe)    - MILD (1-3): doesn't interfere with normal activities     - MODERATE (4-7): interferes with normal activities or awakens from sleep    - SEVERE (8-10): excruciating pain, unable to do any normal activities       Not pain but discomfort, lay down and sleep and it goes away 7. CARDIAC RISK FACTORS: Do you have any history of heart problems or risk factors for heart disease? (e.g., angina, prior heart attack; diabetes, high blood  pressure, high cholesterol, smoker, or strong family history of heart disease)     significant 8. PULMONARY RISK FACTORS: Do you have any history of lung disease?  (e.g., blood clots in lung, asthma, emphysema, birth control pills)     COPD 10. OTHER SYMPTOMS: Do you have any other symptoms? (e.g., dizziness, nausea, vomiting, sweating, fever, difficulty breathing, cough)       Dizziness  Protocols used: Dizziness - Lightheadedness-A-AH, Chest Pain-A-AH

## 2023-08-27 DIAGNOSIS — E7800 Pure hypercholesterolemia, unspecified: Secondary | ICD-10-CM | POA: Insufficient documentation

## 2023-08-27 DIAGNOSIS — R519 Headache, unspecified: Secondary | ICD-10-CM | POA: Insufficient documentation

## 2023-08-27 DIAGNOSIS — K279 Peptic ulcer, site unspecified, unspecified as acute or chronic, without hemorrhage or perforation: Secondary | ICD-10-CM | POA: Insufficient documentation

## 2023-08-27 DIAGNOSIS — Q2112 Patent foramen ovale: Secondary | ICD-10-CM | POA: Insufficient documentation

## 2023-08-27 DIAGNOSIS — E119 Type 2 diabetes mellitus without complications: Secondary | ICD-10-CM | POA: Insufficient documentation

## 2023-08-27 DIAGNOSIS — I5030 Unspecified diastolic (congestive) heart failure: Secondary | ICD-10-CM | POA: Insufficient documentation

## 2023-08-28 ENCOUNTER — Ambulatory Visit

## 2023-08-28 VITALS — BP 100/58 | HR 85 | Ht 72.0 in | Wt 249.0 lb

## 2023-08-28 DIAGNOSIS — N2889 Other specified disorders of kidney and ureter: Secondary | ICD-10-CM

## 2023-08-28 DIAGNOSIS — E7800 Pure hypercholesterolemia, unspecified: Secondary | ICD-10-CM | POA: Diagnosis not present

## 2023-08-28 DIAGNOSIS — I25119 Atherosclerotic heart disease of native coronary artery with unspecified angina pectoris: Secondary | ICD-10-CM

## 2023-08-28 DIAGNOSIS — R42 Dizziness and giddiness: Secondary | ICD-10-CM

## 2023-08-28 DIAGNOSIS — I10 Essential (primary) hypertension: Secondary | ICD-10-CM

## 2023-08-28 DIAGNOSIS — I5032 Chronic diastolic (congestive) heart failure: Secondary | ICD-10-CM

## 2023-08-28 HISTORY — DX: Dizziness and giddiness: R42

## 2023-08-28 MED ORDER — ISOSORBIDE MONONITRATE ER 30 MG PO TB24
30.0000 mg | ORAL_TABLET | Freq: Every day | ORAL | 3 refills | Status: AC
Start: 2023-08-28 — End: ?

## 2023-08-28 NOTE — Assessment & Plan Note (Addendum)
 CAD s/p PCI of LAD in 2011 and with worsening angina underwent cardiac cath at PCI of LAD ISR and another cath and PCI of diagonal 1 in 2024,  Well-controlled symptoms.  current regimen of Imdur  120 mg, Ranexa  500 mg twice daily and metoprolol  tartrate 50 mg twice daily  In the context of positional lightheadedness, lower the dose of Imdur  30 mg once daily.  Continue with Plavix  75 mg once daily. On atorvastatin  40 mg once daily.

## 2023-08-28 NOTE — Assessment & Plan Note (Signed)
 Hyperlipidemia with last lipid panel 05/19/2023 total cholesterol 121, HDL 27, LDL 63, triglycerides 186. Well-controlled on current regimen with atorvastatin  40 mg and Repatha .

## 2023-08-28 NOTE — Patient Instructions (Signed)
 Medication Instructions:  Your physician has recommended you make the following change in your medication:   START: Imdur  30 mg daily STOP: Clonidine  STOP: Lisinopril   *If you need a refill on your cardiac medications before your next appointment, please call your pharmacy*  Lab Work: None If you have labs (blood work) drawn today and your tests are completely normal, you will receive your results only by: MyChart Message (if you have MyChart) OR A paper copy in the mail If you have any lab test that is abnormal or we need to change your treatment, we will call you to review the results.  Testing/Procedures: Your physician has requested that you have an echocardiogram. Echocardiography is a painless test that uses sound waves to create images of your heart. It provides your doctor with information about the size and shape of your heart and how well your heart's chambers and valves are working. This procedure takes approximately one hour. There are no restrictions for this procedure. Please do NOT wear cologne, perfume, aftershave, or lotions (deodorant is allowed). Please arrive 15 minutes prior to your appointment time.  Please note: We ask at that you not bring children with you during ultrasound (echo/ vascular) testing. Due to room size and safety concerns, children are not allowed in the ultrasound rooms during exams. Our front office staff cannot provide observation of children in our lobby area while testing is being conducted. An adult accompanying a patient to their appointment will only be allowed in the ultrasound room at the discretion of the ultrasound technician under special circumstances. We apologize for any inconvenience.   Follow-Up: At [facility], you and your health needs are our priority.  As part of our continuing mission to provide you with exceptional heart care, our providers are all part of one team.  This team includes your primary Cardiologist  (physician) and Advanced Practice Providers or APPs (Physician Assistants and Nurse Practitioners) who all work together to provide you with the care you need, when you need it.  Your next appointment:   6 week(s)  Provider:   Alean Kobus, MD    We recommend signing up for the patient portal called MyChart.  Sign up information is provided on this After Visit Summary.  MyChart is used to connect with patients for Virtual Visits (Telemedicine).  Patients are able to view lab/test results, encounter notes, upcoming appointments, etc.  Non-urgent messages can be sent to your provider as well.   To learn more about what you can do with MyChart, go to [email or web address].   Other Instructions None

## 2023-08-28 NOTE — Assessment & Plan Note (Addendum)
 Well-controlled. Orthostatic lightheadedness symptoms. Orthostatic vitals checked in the office were unremarkable although there was a drop in systolic blood pressure into 90s when he stood up from his baseline of 100.  Previously on lisinopril  that was discontinued 2 weeks ago.  Lowered the dose of Imdur  to 30 mg once daily. Continue metoprolol  tartrate 50 mg twice daily

## 2023-08-28 NOTE — Assessment & Plan Note (Signed)
 Renal mass noted on prior CT scans from September and October 2024.  Has not had follow-up MRI as recommended by the radiologist.  With his ongoing weight loss 70 pounds over the past year, recommended this needs to be evaluated promptly.  Advised to reach out to his PCP right away.

## 2023-08-28 NOTE — Assessment & Plan Note (Signed)
 Last echocardiogram May 2024 with EF 55%.  Appears euvolemic and compensated. Remains on low-dose torsemide  20 mg once a day.  Continue with low-salt diet. Advised to monitor his weights at home regularly.

## 2023-08-28 NOTE — Assessment & Plan Note (Signed)
 Vitals checked were unremarkable. Lowering Imdur  dose to 30 mg once daily. Will obtain echocardiogram. If symptoms persist despite lowering the dose of Imdur  and echocardiogram results are unremarkable, although there is low suspicion for his symptoms to be related to any arrhythmias will obtain Zio patch for comprehensive assessment.

## 2023-08-28 NOTE — Progress Notes (Signed)
 Cardiology Consultation:    Date:  08/28/2023   ID:  [name], DOB 03-29-1955, MRN [id number]  PCP:  Cyndi Shaver, PA-C  Cardiologist:  Alean SAUNDERS Tukker Byrns, MD   Referring MD: Cyndi Shaver, PA-C   No chief complaint on file.    ASSESSMENT AND PLAN:   [name] 68 year old male  history of CHF with preserved EF, CAD s/p PCI of LAD in 2011 and with worsening angina underwent cardiac cath at PCI of LAD ISR and another cath and PCI of diagonal 1 in 2024, PFO s/p closure in 2015 at [facility], diabetes mellitus, hyperlipidemia, left renal mass on CT abdomen [September 2024 and October 2024, CKD stage III, chronic back pain uses oxycodone  3 times a day, uses nicotine dip.  Last echocardiogram from May 2024 with EF 55%, normal wall motion, mild LVH, normal RV size and function.  Here to establish care after moving to [facility]. Has reported 70 pound weight loss since November 2024. Has not had follow-up visit regarding his left renal mass identified on prior CT imaging in September and October 2024.  Has been dealing with significant symptoms of lightheadedness and dizziness which are positional and associated with low blood pressures.  EKG in the clinic today sinus rhythm heart rate 83/min, PR interval 184 ms, QRS duration 96 ms, QTc 467 ms.  Problem List Items Addressed This Visit     CAD (coronary artery disease) (Chronic)   CAD s/p PCI of LAD in 2011 and with worsening angina underwent cardiac cath at PCI of LAD ISR and another cath and PCI of diagonal 1 in 2024,  Well-controlled symptoms.  current regimen of Imdur  120 mg, Ranexa  500 mg twice daily and metoprolol  tartrate 50 mg twice daily  In the context of positional lightheadedness, lower the dose of Imdur  30 mg once daily.  Continue with Plavix  75 mg once daily. On atorvastatin  40 mg once daily.        Relevant Medications   isosorbide  mononitrate (IMDUR ) 30 MG 24 hr tablet   Other Relevant Orders    ECHOCARDIOGRAM COMPLETE   EKG 12-Lead   HTN (hypertension) (Chronic)   Well-controlled. Orthostatic lightheadedness symptoms. Orthostatic vitals checked in the office were unremarkable although there was a drop in systolic blood pressure into 90s when he stood up from his baseline of 100.  Previously on lisinopril  that was discontinued 2 weeks ago.  Lowered the dose of Imdur  to 30 mg once daily. Continue metoprolol  tartrate 50 mg twice daily       Relevant Medications   isosorbide  mononitrate (IMDUR ) 30 MG 24 hr tablet   Other Relevant Orders   EKG 12-Lead   Left renal mass   Renal mass noted on prior CT scans from September and October 2024.  Has not had follow-up MRI as recommended by the radiologist.  With his ongoing weight loss 70 pounds over the past year, recommended this needs to be evaluated promptly.  Advised to reach out to his PCP right away.      (HFpEF) heart failure with preserved ejection fraction [facility]) - Primary   Last echocardiogram May 2024 with EF 55%.  Appears euvolemic and compensated. Remains on low-dose torsemide  20 mg once a day.  Continue with low-salt diet. Advised to monitor his weights at home regularly.       Relevant Medications   isosorbide  mononitrate (IMDUR ) 30 MG 24 hr tablet   Other Relevant Orders   ECHOCARDIOGRAM COMPLETE   EKG 12-Lead   Hypercholesteremia  Hyperlipidemia with last lipid panel 05/19/2023 total cholesterol 121, HDL 27, LDL 63, triglycerides 186. Well-controlled on current regimen with atorvastatin  40 mg and Repatha .       Relevant Medications   isosorbide  mononitrate (IMDUR ) 30 MG 24 hr tablet   Other Relevant Orders   EKG 12-Lead   Orthostatic lightheadedness   Vitals checked were unremarkable. Lowering Imdur  dose to 30 mg once daily. Will obtain echocardiogram. If symptoms persist despite lowering the dose of Imdur  and echocardiogram results are unremarkable, although there is low suspicion for his symptoms  to be related to any arrhythmias will obtain Zio patch for comprehensive assessment.      Relevant Medications   isosorbide  mononitrate (IMDUR ) 30 MG 24 hr tablet   I will have him return for a close follow-up in 4 to 6 weeks   History of Present Illness:    Benjamin Bush is a 68 y.o. male who is being seen today for follow-up visit. PCP is Cyndi Shaver, PA-C.  Last visit with cardiology was 06/10/2023 with Dr.Mallipeddi at heart care in [place]. They have recently moved to [facility] and reestablish care with PCP locally and here to establish care with us .  Has history of CHF with preserved EF, CAD s/p PCI of LAD in 2011 and with worsening angina underwent cardiac cath at PCI of LAD ISR and another cath and PCI of diagonal 1 in 2024, PFO s/p closure in 2015 at [facility], diabetes mellitus, hyperlipidemia, left renal mass on CT abdomen [September 2024 and October 2024, CKD stage III, chronic back pain.  There were some concerns about black stools and reportedly had Cologuard study done 06/10/2023 that was unremarkable.  Here for the visit today accompanied by his significant other.  Mentions his major issue has been how lightheaded and dizzy he has been getting along with fatigue when he gets up from supine to sitting or sitting to standing position.  This has been a constant issue and affecting his quality of life. Denies any syncopal episodes or falls.  Does report on and off episodes of chest which are momentary and not a persistent discomfort.  Mentions blood pressures have been running low systolic around 100s at home consistently. In this context he never started using clonidine  that was prescribed to him previously. His PCP recently discontinued lisinopril  about 2 weeks ago.  Denies any orthopnea or paroxysmal nocturnal dyspnea.  No pedal edema. Denies any blood in urine or stools.  Very notably he has lost about 70 pounds since November last year. He has not had follow-up  visit regarding his CT abdomen findings of exophyticLesion associated with mid to upper left kidney  Past Medical History:  Diagnosis Date   (HFpEF) heart failure with preserved ejection fraction (HCC)    a. 06/2016 Echo: Ef >55%, gr1 DD, dil RV w/ nl syst fxn, mildly dil LA.   Atrial septal defect 04/08/2013   BPH (benign prostatic hyperplasia) 11/17/2022   Bronchomalacia 11/08/2021   CAD (coronary artery disease)    a. s/p stenting x 2 (RCA/LCX)- [place], [place]; b. 2014 Cath: mild RCA/LCX ISR.   Chronic diastolic CHF (congestive heart failure) (HCC) 11/22/2016   Chronic pain disorder 06/25/2022   CKD (chronic kidney disease) stage 3, GFR 30-59 ml/min (HCC) 01/01/2017   COPD (chronic obstructive pulmonary disease) (HCC) 05/13/2013   Depression    Diabetes mellitus without complication (HCC)    Erectile dysfunction 11/17/2022   GERD (gastroesophageal reflux disease) 04/10/2012   Headache    History  of coronary artery stent placement 01/01/2017   History of pulmonary embolus (PE) 11/22/2016   HTN (hypertension) 04/10/2012   Hx of percutaneous transcatheter closure of congenital ASD 01/01/2017   Hypercholesteremia    Hyperlipidemia associated with type 2 diabetes mellitus (HCC) 11/12/2014   Hypogonadism in male 11/17/2022   Insomnia 01/03/2022   Ischemic heart disease due to coronary artery obstruction (HCC) 06/10/2022   Cath on 5/20 w/ PCI to mid LAD for in stent restonosis 80% of LAD. On plavix  and aspirin . Started on O2 while being monitored after this procedure. No recurrent angina, did go to ED for chest pain that was relieved w/ GI cocktail and increasing PPI.     Long-term current use of opiate analgesic 03/25/2021   Lumbar spondylosis 03/25/2021   Morbid obesity (HCC)    Neurocognitive disorder 03/16/2019   OA (osteoarthritis) of hip 03/03/2022   OSA (obstructive sleep apnea)    Osteoarthritis of knee 03/25/2021   Osteoarthritis of metacarpophalangeal (MCP) joint 05/01/2022    Chronic right hand pain across dorsum, pain with most movements. Suspect arthritis. XR today. Also has some carpal tunnel in that wrist, advised splint at night.     PFO (patent foramen ovale)    a. s/p percutaneous closure @ [name]).   PUD (peptic ulcer disease)    Recurrent falls 01/19/2019   Renal mass 06/10/2023   Suicidal ideation 03/16/2019   Type 2 diabetes mellitus with chronic kidney disease, with long-term current use of insulin  (HCC) 04/10/2016   50 units NPH twice daily, continue glipizide  10 twice daily  FBG have been improving, want to add a med or meal time. But costs have been issue. Will try to work on that.  Needs A1c < 7 if he needs eventually to have a knee replacement     -try to start jardiance  through mail order     Last A1c:  Lab Results   Component Value Date    A1C 9.0 (H) 07/16/2022    A1C 9.6 (A) 06/10/2022    A1C 7.4 (A)     Past Surgical History:  Procedure Laterality Date   CORONARY ANGIOPLASTY WITH STENT PLACEMENT     HERNIA REPAIR     JOINT REPLACEMENT Left    knee replacement   SHOULDER SURGERY     vetebral fusion      Current Medications: Current Meds  Medication Sig   atorvastatin  (LIPITOR ) 40 MG tablet Take 1 tablet (40 mg total) by mouth daily.   clopidogrel  (PLAVIX ) 75 MG tablet Take 1 tablet (75 mg total) by mouth daily with breakfast.   Evolocumab  (REPATHA  SURECLICK) 140 MG/ML SOAJ Inject 140 mg into the skin every 14 (fourteen) days.   isosorbide  mononitrate (IMDUR ) 30 MG 24 hr tablet Take 1 tablet (30 mg total) by mouth daily.   JARDIANCE  10 MG TABS tablet TAKE 1 TABLET BY MOUTH ONCE DAILY BEFORE BREAKFAST   metFORMIN  (GLUCOPHAGE ) 500 MG tablet Take 1 tablet (500 mg total) by mouth 2 (two) times daily with a meal.   metoprolol  tartrate (LOPRESSOR ) 50 MG tablet Take 1 tablet (50 mg total) by mouth 2 (two) times daily.   nitroGLYCERIN  (NITROSTAT ) 0.4 MG SL tablet Place 1 tablet (0.4 mg total) under the tongue every 5 (five)  minutes x 2 doses as needed for chest pain.   oxyCODONE -acetaminophen  (PERCOCET) 10-325 MG tablet Take 1 tablet by mouth 3 (three) times daily as needed.   ranolazine  (RANEXA ) 500 MG 12 hr tablet Take  500 mg by mouth 2 (two) times daily.   sertraline  (ZOLOFT ) 50 MG tablet Take 1 tablet (50 mg total) by mouth daily.   sucralfate  (CARAFATE ) 1 g tablet TAKE 1 TABLET BY MOUTH TWICE DAILY WITH A MEAL   torsemide  (DEMADEX ) 20 MG tablet Take 1 tablet (20 mg total) by mouth daily.   [DISCONTINUED] isosorbide  mononitrate (IMDUR ) 120 MG 24 hr tablet Take 1 tablet (120 mg total) by mouth daily.     Allergies:   Codeine   Social History   Socioeconomic History   Marital status: Divorced    Spouse name: Not on file   Number of children: 2   Years of education: Not on file   Highest education level: High school graduate  Occupational History    Comment: Disabled  Tobacco Use   Smoking status: Former    Types: Cigarettes   Smokeless tobacco: Current    Types: Chew   Tobacco comments:    Chews tobacco  Vaping Use   Vaping status: Never Used  Substance and Sexual Activity   Alcohol use: No   Drug use: No   Sexual activity: Yes  Other Topics Concern   Not on file  Social History Narrative   Not on file   Social Drivers of Health   Financial Resource Strain: Medium Risk (11/08/2021)   Received from [facility]   Overall Financial Resource Strain (CARDIA)    Difficulty of Paying Living Expenses: Somewhat hard  Food Insecurity: No Food Insecurity (11/08/2021)   Received from [facility]   Hunger Vital Sign    Within the past 12 months, you worried that your food would run out before you got the money to buy more.: Never true    Within the past 12 months, the food you bought just didn't last and you didn't have money to get more.: Never true  Transportation Needs: No Transportation Needs (09/16/2022)   Received from [facility] - Transportation    Lack of  Transportation (Medical): No    Lack of Transportation (Non-Medical): No  Physical Activity: Unknown (03/22/2019)   Received from [facility]   Exercise Vital Sign    On average, how many days per week do you engage in moderate to strenuous exercise (like a brisk walk)?: Patient declined    On average, how many minutes do you engage in exercise at this level?: Patient declined  Stress: Unknown (03/22/2019)   Received from [facility] - Occupational Stress Questionnaire    Feeling of Stress : Patient declined  Social Connections: Unknown (03/22/2019)   Received from [facility]   Social Connection and Isolation Panel    In a typical week, how many times do you talk on the phone with family, friends, or neighbors?: Patient declined    How often do you get together with friends or relatives?: Patient declined    How often do you attend church or religious services?: Patient declined    Do you belong to any clubs or organizations such as church groups, unions, fraternal or athletic groups, or school groups?: Patient declined    How often do you attend meetings of the clubs or organizations you belong to?: Patient declined    Are you married, widowed, divorced, separated, never married, or living with a partner?: Patient declined     Family History: The patient's family history includes CAD in his father and mother; Hypertension  in his mother. ROS:   Please see the history of present illness.    All 14 point review of systems negative except as described per history of present illness.  EKGs/Labs/Other Studies Reviewed:    The following studies were reviewed today:   EKG:       Recent Labs: 03/24/2023: TSH 1.430 07/16/2023: BUN 31; Creatinine, Ser 1.39; Hemoglobin 16.4; Platelets 174; Potassium 4.4; Sodium 143  Recent Lipid Panel    Component Value Date/Time   CHOL 121 05/19/2023 0802   TRIG 186 (H) 05/19/2023 0802   HDL 27 (L)  05/19/2023 0802   CHOLHDL 4.5 05/19/2023 0802   CHOLHDL 5.2 11/22/2016 1048   VLDL 55 (H) 11/22/2016 1048   LDLCALC 63 05/19/2023 0802    Physical Exam:    VS:  BP (!) 100/58   Pulse 85   Ht 6' (1.829 m)   Wt 249 lb (112.9 kg)   SpO2 95%   BMI 33.77 kg/m     Wt Readings from Last 3 Encounters:  08/28/23 249 lb (112.9 kg)  08/18/23 258 lb (117 kg)  06/10/23 274 lb (124.3 kg)     GENERAL:  Well nourished, well developed in no acute distress NECK: No JVD; No carotid bruits CARDIAC: RRR, S1 and S2 present, no murmurs, no rubs, no gallops CHEST:  Clear to auscultation without rales, wheezing or rhonchi  Extremities: No pitting pedal edema. Pulses bilaterally symmetric with radial 2+ and dorsalis pedis 2+ NEUROLOGIC:  Alert and oriented x 3  Medication Adjustments/Labs and Tests Ordered: Current medicines are reviewed at length with the patient today.  Concerns regarding medicines are outlined above.  Orders Placed This Encounter  Procedures   EKG 12-Lead   ECHOCARDIOGRAM COMPLETE   Meds ordered this encounter  Medications   isosorbide  mononitrate (IMDUR ) 30 MG 24 hr tablet    Sig: Take 1 tablet (30 mg total) by mouth daily.    Dispense:  90 tablet    Refill:  3    Signed, Clara Herbison reddy Dajaun Goldring, MD, MPH, [facility]. 08/28/2023 3:48 PM    [facility]

## 2023-09-02 ENCOUNTER — Encounter: Payer: Self-pay | Admitting: Physician Assistant

## 2023-09-02 ENCOUNTER — Ambulatory Visit (INDEPENDENT_AMBULATORY_CARE_PROVIDER_SITE_OTHER): Admitting: Physician Assistant

## 2023-09-02 VITALS — BP 116/70 | HR 57 | Ht 72.0 in | Wt 250.4 lb

## 2023-09-02 DIAGNOSIS — N289 Disorder of kidney and ureter, unspecified: Secondary | ICD-10-CM

## 2023-09-02 HISTORY — DX: Disorder of kidney and ureter, unspecified: N28.9

## 2023-09-02 NOTE — Progress Notes (Signed)
 Established patient visit   Patient: Benjamin Bush   DOB: [birth year]   68 y.o. Male  MRN: [id number] Visit Date: 09/02/2023  Today's healthcare provider: Manuelita Flatness, PA-C   Cc. Follow up per cardiologist  Subjective    Pt presents today as his cardiologist reviewed imaging and recommended they discuss need for MRI.   Pt reports improvement in dizziness, but still feels dizzy/nauseous after taking AM meds. Reports his BS when feeling unwell is 160-170s. He does not check his BP at this time.   He also reports continued issue w/ urine flow. He did not make a urology appointment.    Medications: Outpatient Medications Prior to Visit  Medication Sig   atorvastatin  (LIPITOR ) 40 MG tablet Take 1 tablet (40 mg total) by mouth daily.   clopidogrel  (PLAVIX ) 75 MG tablet Take 1 tablet (75 mg total) by mouth daily with breakfast.   Evolocumab  (REPATHA  SURECLICK) 140 MG/ML SOAJ Inject 140 mg into the skin every 14 (fourteen) days.   isosorbide  mononitrate (IMDUR ) 30 MG 24 hr tablet Take 1 tablet (30 mg total) by mouth daily.   JARDIANCE  10 MG TABS tablet TAKE 1 TABLET BY MOUTH ONCE DAILY BEFORE BREAKFAST   metFORMIN  (GLUCOPHAGE ) 500 MG tablet Take 1 tablet (500 mg total) by mouth 2 (two) times daily with a meal.   metoprolol  tartrate (LOPRESSOR ) 50 MG tablet Take 1 tablet (50 mg total) by mouth 2 (two) times daily.   nitroGLYCERIN  (NITROSTAT ) 0.4 MG SL tablet Place 1 tablet (0.4 mg total) under the tongue every 5 (five) minutes x 2 doses as needed for chest pain.   oxyCODONE -acetaminophen  (PERCOCET) 10-325 MG tablet Take 1 tablet by mouth 3 (three) times daily as needed.   ranolazine  (RANEXA ) 500 MG 12 hr tablet Take 500 mg by mouth 2 (two) times daily.   sertraline  (ZOLOFT ) 50 MG tablet Take 1 tablet (50 mg total) by mouth daily.   sucralfate  (CARAFATE ) 1 g tablet TAKE 1 TABLET BY MOUTH TWICE DAILY WITH A MEAL   testosterone  cypionate (DEPOTESTOSTERONE CYPIONATE) 200 MG/ML injection  Inject into the muscle.   tiZANidine (ZANAFLEX) 4 MG tablet Take 4 mg by mouth every 12 (twelve) hours as needed.   torsemide  (DEMADEX ) 20 MG tablet Take 1 tablet (20 mg total) by mouth daily.   No facility-administered medications prior to visit.    Review of Systems  Constitutional:  Negative for fatigue and fever.  Respiratory:  Negative for cough and shortness of breath.   Cardiovascular:  Negative for chest pain, palpitations and leg swelling.  Neurological:  Positive for dizziness. Negative for headaches.       Objective    BP 116/70   Pulse (!) 57   Ht 6' (1.829 m)   Wt 250 lb 6.4 oz (113.6 kg)   BMI 33.96 kg/m    Physical Exam Vitals reviewed.  Constitutional:      Appearance: He is not ill-appearing.  HENT:     Head: Normocephalic.  Eyes:     Conjunctiva/sclera: Conjunctivae normal.  Cardiovascular:     Rate and Rhythm: Normal rate.  Pulmonary:     Effort: Pulmonary effort is normal. No respiratory distress.  Neurological:     Mental Status: He is alert and oriented to person, place, and time.  Psychiatric:        Mood and Affect: Mood normal.        Behavior: Behavior normal.      No results found for  any visits on 09/02/23.  Assessment & Plan    Lesion of left native kidney Assessment & Plan: 12/15/22 ABD/Pelvic CT w/ contrast  KIDNEYS/URETERS: Symmetric renal enhancement.  No hydronephrosis. Unchanged low-density (21 HU) exophytic lesion in the mid/upper left kidney (4:85, 2:42).MRI is recommended to characterize left renal mass.  06/19/23 ABD/Pelvic CT w/o contrast L renal cyst. No follow up recommended. No comment on size or other nature of lesion.  But there was no contrast used.  Advised pt when I get conflicting reports, especially when one is benign, I would prefer to ref to specialist, nephrologist, rather than order the expensive MRI.   Pt and wife were upset at this recommendation, conversation was a bit aggressive. They feel they have  been referred to too many specialists, and that we are just making money off of them.   I advised that if an MRI is not necessary, and the lesion is a stable cyst, then this is the more cost effective solution. I cannot confidently recommend an ultrasound instead, as the initial radiologist recommendation was an MRI.  Reassuringly, there is no documentation of an increase in size of an abnormal kidney lesion, and it was most recently called a cyst.   Pt and wife prefer to wait on referral to nephrology or any additional imaging orders.  Advised my recommendation, but they are able to make their own decisions with care.    Dizziness and nausea post-morning medications may be due to hypotension. Monitoring of BP at home and cardiologist consultation recommended for medication adjustment. This was already the plan with cardiology.  Difficulty urinating with weak stream and dribbling. Urologist referral recommended for further evaluation. Given number for urologist as I have already referred. Pt already managed on tamsulosin, h/o of bph and what sounds like a partial prostatectomy. Last psa 07/16/23 normal.      Manuelita Flatness, PA-C  [facility] at [facility] [phone] (phone) [phone] (fax)  [facility]

## 2023-09-02 NOTE — Patient Instructions (Signed)
 [facility] at [facility]. Please give us  a call to schedule an appointment at [phone]

## 2023-09-02 NOTE — Assessment & Plan Note (Addendum)
 12/15/22 ABD/Pelvic CT w/ contrast  KIDNEYS/URETERS: Symmetric renal enhancement.  No hydronephrosis. Unchanged low-density (21 HU) exophytic lesion in the mid/upper left kidney (4:85, 2:42).MRI is recommended to characterize left renal mass.  06/19/23 ABD/Pelvic CT w/o contrast L renal cyst. No follow up recommended. No comment on size or other nature of lesion.  But there was no contrast used.  Advised pt when I get conflicting reports, especially when one is benign, I would prefer to ref to specialist, nephrologist, rather than order the expensive MRI.   Pt and wife were upset at this recommendation, conversation was a bit aggressive. They feel they have been referred to too many specialists, and that we are just making money off of them.   I advised that if an MRI is not necessary, and the lesion is a stable cyst, then this is the more cost effective solution. I cannot confidently recommend an ultrasound instead, as the initial radiologist recommendation was an MRI.  Reassuringly, there is no documentation of an increase in size of an abnormal kidney lesion, and it was most recently called a cyst.   Pt and wife prefer to wait on referral to nephrology or any additional imaging orders.  Advised my recommendation, but they are able to make their own decisions with care.

## 2023-09-07 ENCOUNTER — Other Ambulatory Visit: Payer: Self-pay | Admitting: Family Medicine

## 2023-09-17 ENCOUNTER — Ambulatory Visit

## 2023-09-17 DIAGNOSIS — I5032 Chronic diastolic (congestive) heart failure: Secondary | ICD-10-CM | POA: Diagnosis not present

## 2023-09-17 DIAGNOSIS — I25119 Atherosclerotic heart disease of native coronary artery with unspecified angina pectoris: Secondary | ICD-10-CM

## 2023-09-17 LAB — ECHOCARDIOGRAM COMPLETE
Area-P 1/2: 2.99 cm2
S' Lateral: 4 cm

## 2023-09-21 ENCOUNTER — Ambulatory Visit: Payer: Self-pay

## 2023-10-05 ENCOUNTER — Other Ambulatory Visit: Payer: Self-pay | Admitting: Family Medicine

## 2023-10-05 ENCOUNTER — Encounter: Payer: Self-pay | Admitting: Family Medicine

## 2023-10-05 ENCOUNTER — Ambulatory Visit: Admitting: Family Medicine

## 2023-10-05 VITALS — BP 116/74 | HR 86 | Temp 98.0°F | Resp 16 | Ht 72.0 in | Wt 243.6 lb

## 2023-10-05 DIAGNOSIS — R195 Other fecal abnormalities: Secondary | ICD-10-CM

## 2023-10-05 DIAGNOSIS — N183 Chronic kidney disease, stage 3 unspecified: Secondary | ICD-10-CM | POA: Diagnosis not present

## 2023-10-05 DIAGNOSIS — E1122 Type 2 diabetes mellitus with diabetic chronic kidney disease: Secondary | ICD-10-CM

## 2023-10-05 DIAGNOSIS — Z794 Long term (current) use of insulin: Secondary | ICD-10-CM | POA: Diagnosis not present

## 2023-10-05 MED ORDER — EMPAGLIFLOZIN 25 MG PO TABS: 25.0000 mg | ORAL_TABLET | Freq: Every day | ORAL | 2 refills | Status: AC

## 2023-10-05 NOTE — Progress Notes (Signed)
 Chief Complaint  Patient presents with   GI Problem   Diarrhea    Diarrhea     Benjamin Bush is 68 y.o. male here for complaint of diarrhea.  Duration: 4 years Abdominal pain? No Bleeding? No Recent travel? No Recent antibiotic use? No Sick contacts? No Fevers? No N/V? No Therapies tried: Kaopectate  No issues after lunch.  Cologuard neg 4 mo ago.  Stress levels are low. +Famhx of IBS.  Is on Metformin .   Past Medical History:  Diagnosis Date   (HFpEF) heart failure with preserved ejection fraction (HCC)    a. 06/2016 Echo: Ef >55%, gr1 DD, dil RV w/ nl syst fxn, mildly dil LA.   Atrial septal defect 04/08/2013   BPH (benign prostatic hyperplasia) 11/17/2022   Bronchomalacia 11/08/2021   CAD (coronary artery disease)    a. s/p stenting x 2 (RCA/LCX)- [place], [place]; b. 2014 Cath: mild RCA/LCX ISR.   Chronic diastolic CHF (congestive heart failure) (HCC) 11/22/2016   Chronic pain disorder 06/25/2022   CKD (chronic kidney disease) stage 3, GFR 30-59 ml/min (HCC) 01/01/2017   COPD (chronic obstructive pulmonary disease) (HCC) 05/13/2013   Depression    Diabetes mellitus without complication (HCC)    Erectile dysfunction 11/17/2022   GERD (gastroesophageal reflux disease) 04/10/2012   Headache    History of coronary artery stent placement 01/01/2017   History of pulmonary embolus (PE) 11/22/2016   HTN (hypertension) 04/10/2012   Hx of percutaneous transcatheter closure of congenital ASD 01/01/2017   Hypercholesteremia    Hyperlipidemia associated with type 2 diabetes mellitus (HCC) 11/12/2014   Hypogonadism in male 11/17/2022   Insomnia 01/03/2022   Ischemic heart disease due to coronary artery obstruction (HCC) 06/10/2022   Cath on 5/20 w/ PCI to mid LAD for in stent restonosis 80% of LAD. On plavix  and aspirin . Started on O2 while being monitored after this procedure. No recurrent angina, did go to ED for chest pain that was relieved w/ GI cocktail and increasing PPI.      Long-term current use of opiate analgesic 03/25/2021   Lumbar spondylosis 03/25/2021   Morbid obesity (HCC)    Neurocognitive disorder 03/16/2019   OA (osteoarthritis) of hip 03/03/2022   OSA (obstructive sleep apnea)    Osteoarthritis of knee 03/25/2021   Osteoarthritis of metacarpophalangeal (MCP) joint 05/01/2022   Chronic right hand pain across dorsum, pain with most movements. Suspect arthritis. XR today. Also has some carpal tunnel in that wrist, advised splint at night.     PFO (patent foramen ovale)    a. s/p percutaneous closure @ [name]).   PUD (peptic ulcer disease)    Recurrent falls 01/19/2019   Renal mass 06/10/2023   Suicidal ideation 03/16/2019   Type 2 diabetes mellitus with chronic kidney disease, with long-term current use of insulin  (HCC) 04/10/2016   50 units NPH twice daily, continue glipizide  10 twice daily  FBG have been improving, want to add a med or meal time. But costs have been issue. Will try to work on that.  Needs A1c < 7 if he needs eventually to have a knee replacement     -try to start jardiance  through mail order     Last A1c:  Lab Results   Component Value Date    A1C 9.0 (H) 07/16/2022    A1C 9.6 (A) 06/10/2022    A1C 7.4 (A)     BP 116/74 (BP Location: Left Arm, Patient Position: Sitting)   Pulse 86  Temp 98 F (36.7 C) (Oral)   Resp 16   Ht 6' (1.829 m)   Wt 243 lb 9.6 oz (110.5 kg)   SpO2 96%   BMI 33.04 kg/m  Gen: awake, alert, appearing stated age HEENT: MMM Heart: RRR, no LE edema Lungs: CTAB, no accessory muscle use Abd: BS+, soft, TTP, non-distended, no masses or organomegaly Psych: Age appropriate judgment and insight  Loose stools - Plan: Ambulatory referral to Gastroenterology  Type 2 diabetes mellitus with stage 3 chronic kidney disease, with long-term current use of insulin , unspecified whether stage 3a or 3b CKD (HCC) - Plan: empagliflozin  (JARDIANCE ) 25 MG TABS tablet  AE of med. Stop Metformin . Increase  Jardiance  from 10 mg/d to 25 mg/d. Appreciate endo assistance. Consider fiber supplementation. Refer GI.  F/u prn. The patient voiced understanding and agreement to the plan.  Benjamin [place], DO 10/05/23 3:20 PM

## 2023-10-05 NOTE — Patient Instructions (Signed)
 If you do not hear anything about your referral in the next 1-2 weeks, call our office and ask for an update.  Take Metamucil or Benefiber daily.  Let us  know if you need anything.

## 2023-10-15 ENCOUNTER — Ambulatory Visit

## 2023-10-15 VITALS — BP 100/60 | HR 62 | Ht 72.0 in | Wt 240.2 lb

## 2023-10-15 DIAGNOSIS — F17200 Nicotine dependence, unspecified, uncomplicated: Secondary | ICD-10-CM | POA: Insufficient documentation

## 2023-10-15 DIAGNOSIS — I10 Essential (primary) hypertension: Secondary | ICD-10-CM

## 2023-10-15 DIAGNOSIS — I5032 Chronic diastolic (congestive) heart failure: Secondary | ICD-10-CM

## 2023-10-15 DIAGNOSIS — R42 Dizziness and giddiness: Secondary | ICD-10-CM | POA: Diagnosis not present

## 2023-10-15 DIAGNOSIS — F17228 Nicotine dependence, chewing tobacco, with other nicotine-induced disorders: Secondary | ICD-10-CM

## 2023-10-15 DIAGNOSIS — I25119 Atherosclerotic heart disease of native coronary artery with unspecified angina pectoris: Secondary | ICD-10-CM | POA: Diagnosis not present

## 2023-10-15 MED ORDER — LISINOPRIL 5 MG PO TABS: 5.0000 mg | ORAL_TABLET | Freq: Every day | ORAL | 3 refills | Status: AC

## 2023-10-15 MED ORDER — TORSEMIDE 20 MG PO TABS: 20.0000 mg | ORAL_TABLET | ORAL | 3 refills | Status: AC

## 2023-10-15 NOTE — Assessment & Plan Note (Signed)
 CAD s/p PCI of LAD in 2011 and with worsening angina underwent cardiac cath at PCI of LAD ISR and another cath and PCI of diagonal 1 in 2024  At [facility]   Continue clopidogrel  75 mg once daily. On atorvastatin  40 mg once daily. Antianginal regimen currently on Imdur  30 mg once daily Ranolazine  500 mg twice daily Metoprolol  tartrate 50 mg twice daily.  Good functional status.

## 2023-10-15 NOTE — Assessment & Plan Note (Signed)
 Well-controlled in fact on the lower end. Likely contributing to lightheadedness symptoms positional.  Unclear when he resumed lisinopril . Lowered the dose of lisinopril  to 5 mg once daily. Continue Imdur  30 mg once daily Continue metoprolol  tartrate 50 mg twice daily.

## 2023-10-15 NOTE — Progress Notes (Signed)
 Cardiology Consultation:    Date:  10/15/2023   ID:  MACKEY VARRICCHIO, DOB 1955-04-13, MRN [id number]  PCP:  Cyndi Shaver, PA-C  Cardiologist:  Alean SAUNDERS Amaliya Whitel[place], MD   Referring MD: Cyndi Shaver, PA-C   No chief complaint on file.    ASSESSMENT AND PLAN:   Mr. Portocarrero 68 year old male history of CHF with preserved EF, CAD s/p PCI of LAD in 2011 and with worsening angina underwent cardiac cath at PCI of LAD ISR and another cath and PCI of diagonal 1 in 2024 at [facility], PFO s/p closure in 2015 at [facility], diabetes mellitus, hyperlipidemia, left renal mass on CT abdomen [September 2024 and October 2024; CT abdomen 06/19/2023 reported this as left renal cyst], CKD stage III, chronic back pain uses oxycodone  3 times a day, uses nicotine dip.  Ongoing weight [down from 330 pounds last November-now 240 pounds appears to have leveled off over the last 3 to 4 months].  Transthoracic echocardiogram 09/17/2023 with normal LVEF 60 to 65%, grade 1 diastolic dysfunction, no significant valve abnormalities.    Problem List Items Addressed This Visit     CAD (coronary artery disease) (Chronic)   CAD s/p PCI of LAD in 2011 and with worsening angina underwent cardiac cath at PCI of LAD ISR and another cath and PCI of diagonal 1 in 2024  At [facility]   Continue clopidogrel  75 mg once daily. On atorvastatin  40 mg once daily. Antianginal regimen currently on Imdur  30 mg once daily Ranolazine  500 mg twice daily Metoprolol  tartrate 50 mg twice daily.  Good functional status.      Relevant Medications   lisinopril  (ZESTRIL ) 5 MG tablet   torsemide  (DEMADEX ) 20 MG tablet   HTN (hypertension) (Chronic)   Well-controlled in fact on the lower end. Likely contributing to lightheadedness symptoms positional.  Unclear when he resumed lisinopril . Lowered the dose of lisinopril  to 5 mg once daily. Continue Imdur  30 mg once daily Continue metoprolol  tartrate 50 mg twice daily.       Relevant Medications    lisinopril  (ZESTRIL ) 5 MG tablet   torsemide  (DEMADEX ) 20 MG tablet   (HFpEF) heart failure with preserved ejection fraction (HCC) - Primary   Euvolemic, compensated. Good functional status NYHA class I. Continue with low-salt diet below 2 g/day. Advised avoiding caffeinated drinks and sodas and stick with regular water.  Continue with torsemide  will reduce the dose to 20 mg every other day to avoid orthostatic blood pressure changes.  Continue Jardiance  he is on diabetic dose 25 mg once daily. Continue metoprolol  tartrate 50 mg twice daily Continue lisinopril , lowered the dose to 5 mg once daily to avoid orthostatic blood pressure changes.       Relevant Medications   lisinopril  (ZESTRIL ) 5 MG tablet   torsemide  (DEMADEX ) 20 MG tablet   Orthostatic lightheadedness   Symptoms likely related to blood pressure changes with ongoing medications. Cannot exclude autonomic neuropathy in the setting of longstanding history of diabetes.  Lowered the dose of lisinopril  to 5 mg once daily. Previously lowered the dose of Imdur  to 30 mg once daily. In addition recommended he cut down torsemide  dose to every other day 20 mg.      Relevant Medications   lisinopril  (ZESTRIL ) 5 MG tablet   torsemide  (DEMADEX ) 20 MG tablet   Nicotine dependence   Continues to use nicotine. Never smoked. Uses nicotine dip. Recommended to quit given the harmful effects. He acknowledges mentions he will work on it.  Return to clinic tentatively in 3 months.   History of Present Illness:    Benjamin Bush is a 68 y.o. male who is being seen today for follow-up visit. PCP is  Cyndi Shaver, PA-C. Last visit with me in the office was 08/28/2023 when he establish care after moving to [facility] Here for the visit today accompanied by his wife  history of CHF with preserved EF, CAD s/p PCI of LAD in 2011 and with worsening angina underwent cardiac cath at PCI of LAD ISR and another cath and PCI of  diagonal 1 in 2024 at [facility], PFO s/p closure in 2015 at [facility], diabetes mellitus, hyperlipidemia, left renal mass on CT abdomen [September 2024 and October 2024; CT abdomen 06/19/2023 reported this as left renal cyst], CKD stage III, chronic back pain uses oxycodone  3 times a day, uses nicotine dip.  Ongoing weight [down from 330 pounds last November-now 240 pounds appears to have leveled off over the last 3 to 4 months].  Transthoracic echocardiogram 09/17/2023 with normal LVEF 60 to 65%, grade 1 diastolic dysfunction, no significant valve abnormalities.  Here for the visit today mentions overall his functional capacity has been excellent able to do things without any limitations at home.  Does however note symptoms of lightheadedness when he changes position notably from bending down to standing up.  He has to sit down and work instead of standing up and leaning down. Denies any falls, syncopal episodes. Denies any chest pain, shortness of breath, orthopnea or paroxysmal nocturnal dyspnea.  Mentions blood pressures at home typically on the lower end using wrist cuff systolic mostly around 100 to 889 mmHg.  Good compliance with all his medications and has good insight. Continues to take torsemide  20 mg every day, has not had any significant swelling in the legs and had not required any extra doses of torsemide .  Good compliance with his diet salt restrictions, low calorie diet and low fat diet.   Past Medical History:  Diagnosis Date   (HFpEF) heart failure with preserved ejection fraction (HCC)    a. 06/2016 Echo: Ef >55%, gr1 DD, dil RV w/ nl syst fxn, mildly dil LA.   Atrial septal defect 04/08/2013   BPH (benign prostatic hyperplasia) 11/17/2022   Bronchomalacia 11/08/2021   CAD (coronary artery disease)    a. s/p stenting x 2 (RCA/LCX)- [place], [place]; b. 2014 Cath: mild RCA/LCX ISR.   Chronic diastolic CHF (congestive heart failure) (HCC) 11/22/2016   Chronic pain disorder 06/25/2022   CKD  (chronic kidney disease) stage 3, GFR 30-59 ml/min (HCC) 01/01/2017   COPD (chronic obstructive pulmonary disease) (HCC) 05/13/2013   Depression    Diabetes mellitus without complication (HCC)    Erectile dysfunction 11/17/2022   GERD (gastroesophageal reflux disease) 04/10/2012   Headache    History of coronary artery stent placement 01/01/2017   History of pulmonary embolus (PE) 11/22/2016   HTN (hypertension) 04/10/2012   Hx of percutaneous transcatheter closure of congenital ASD 01/01/2017   Hypercholesteremia    Hyperlipidemia associated with type 2 diabetes mellitus (HCC) 11/12/2014   Hypogonadism in male 11/17/2022   Insomnia 01/03/2022   Ischemic heart disease due to coronary artery obstruction (HCC) 06/10/2022   Cath on 5/20 w/ PCI to mid LAD for in stent restonosis 80% of LAD. On plavix  and aspirin . Started on O2 while being monitored after this procedure. No recurrent angina, did go to ED for chest pain that was relieved w/ GI cocktail and increasing PPI.  Lesion of left native kidney 09/02/2023   Long-term current use of opiate analgesic 03/25/2021   Lumbar spondylosis 03/25/2021   Morbid obesity (HCC)    Neurocognitive disorder 03/16/2019   OA (osteoarthritis) of hip 03/03/2022   Orthostatic lightheadedness 08/28/2023   OSA (obstructive sleep apnea)    Osteoarthritis of knee 03/25/2021   Osteoarthritis of metacarpophalangeal (MCP) joint 05/01/2022   Chronic right hand pain across dorsum, pain with most movements. Suspect arthritis. XR today. Also has some carpal tunnel in that wrist, advised splint at night.     PFO (patent foramen ovale)    a. s/p percutaneous closure @ [name], [facility]).   PUD (peptic ulcer disease)    Recurrent falls 01/19/2019   Renal mass 06/10/2023   Suicidal ideation 03/16/2019   Type 2 diabetes mellitus with chronic kidney disease, with long-term current use of insulin  (HCC) 04/10/2016   50 units NPH twice daily, continue glipizide  10  twice daily  FBG have been improving, want to add a med or meal time. But costs have been issue. Will try to work on that.  Needs A1c < 7 if he needs eventually to have a knee replacement     -try to start jardiance  through mail order     Last A1c:  Lab Results   Component Value Date    A1C 9.0 (H) 07/16/2022    A1C 9.6 (A) 06/10/2022    A1C 7.4 (A)     Past Surgical History:  Procedure Laterality Date   CORONARY ANGIOPLASTY WITH STENT PLACEMENT     HERNIA REPAIR     JOINT REPLACEMENT Left    knee replacement   PROSTATE SURGERY     unspec.   SHOULDER SURGERY     vetebral fusion      Current Medications: Current Meds  Medication Sig   atorvastatin  (LIPITOR ) 40 MG tablet Take 1 tablet (40 mg total) by mouth daily.   clopidogrel  (PLAVIX ) 75 MG tablet Take 1 tablet (75 mg total) by mouth daily with breakfast.   empagliflozin  (JARDIANCE ) 25 MG TABS tablet Take 1 tablet (25 mg total) by mouth daily before breakfast.   Evolocumab  (REPATHA  SURECLICK) 140 MG/ML SOAJ Inject 140 mg into the skin every 14 (fourteen) days.   isosorbide  mononitrate (IMDUR ) 30 MG 24 hr tablet Take 1 tablet (30 mg total) by mouth daily.   lisinopril  (ZESTRIL ) 5 MG tablet Take 1 tablet (5 mg total) by mouth daily.   metoprolol  tartrate (LOPRESSOR ) 50 MG tablet Take 1 tablet (50 mg total) by mouth 2 (two) times daily.   nitroGLYCERIN  (NITROSTAT ) 0.4 MG SL tablet Place 1 tablet (0.4 mg total) under the tongue every 5 (five) minutes x 2 doses as needed for chest pain.   oxyCODONE -acetaminophen  (PERCOCET) 10-325 MG tablet Take 1 tablet by mouth 3 (three) times daily as needed.   ranolazine  (RANEXA ) 500 MG 12 hr tablet Take 500 mg by mouth 2 (two) times daily.   sertraline  (ZOLOFT ) 50 MG tablet Take 1 tablet (50 mg total) by mouth daily.   sucralfate  (CARAFATE ) 1 g tablet TAKE 1 TABLET BY MOUTH TWICE DAILY WITH A MEAL   testosterone  cypionate (DEPOTESTOSTERONE CYPIONATE) 200 MG/ML injection Inject into the muscle.    tiZANidine (ZANAFLEX) 4 MG tablet Take 4 mg by mouth every 12 (twelve) hours as needed.   torsemide  (DEMADEX ) 20 MG tablet Take 1 tablet (20 mg total) by mouth every other day.   [DISCONTINUED] lisinopril  (ZESTRIL ) 20 MG tablet Take 20 mg  by mouth daily.   [DISCONTINUED] torsemide  (DEMADEX ) 20 MG tablet Take 1 tablet (20 mg total) by mouth daily.     Allergies:   Codeine   Social History   Socioeconomic History   Marital status: Divorced    Spouse name: Not on file   Number of children: 2   Years of education: Not on file   Highest education level: High school graduate  Occupational History    Comment: Disabled  Tobacco Use   Smoking status: Former    Types: Cigarettes   Smokeless tobacco: Current    Types: Chew   Tobacco comments:    Chews tobacco  Vaping Use   Vaping status: Never Used  Substance and Sexual Activity   Alcohol use: No   Drug use: No   Sexual activity: Yes  Other Topics Concern   Not on file  Social History Narrative   Not on file   Social Drivers of Health   Financial Resource Strain: Medium Risk (11/08/2021)   [facility]   Overall Financial Resource Strain (CARDIA)    Difficulty of Paying Living Expenses: Somewhat hard  Food Insecurity: No Food Insecurity (11/08/2021)   Received from [facility]   Hunger Vital Sign    Within the past 12 months, you worried that your food would run out before you got the money to buy more.: Never true    Within the past 12 months, the food you bought just didn't last and you didn't have money to get more.: Never true  Transportation Needs: No Transportation Needs (09/16/2022)   Received from [facility] - Transportation    Lack of Transportation (Medical): No    Lack of Transportation (Non-Medical): No  Physical Activity: Unknown (03/22/2019)   Received from [facility]   Exercise Vital Sign    On average, how many days per week do you engage in moderate to strenuous exercise  (like a brisk walk)?: Patient declined    On average, how many minutes do you engage in exercise at this level?: Patient declined  Stress: Unknown (03/22/2019)   Received from [facility] - Occupational Stress Questionnaire    Feeling of Stress : Patient declined  Social Connections: Unknown (03/22/2019)   Received from [facility]   Social Connection and Isolation Panel    In a typical week, how many times do you talk on the phone with family, friends, or neighbors?: Patient declined    How often do you get together with friends or relatives?: Patient declined    How often do you attend church or religious services?: Patient declined    Do you belong to any clubs or organizations such as church groups, unions, fraternal or athletic groups, or school groups?: Patient declined    How often do you attend meetings of the clubs or organizations you belong to?: Patient declined    Are you married, widowed, divorced, separated, never married, or living with a partner?: Patient declined     Family History: The patient's family history includes CAD in his father and mother; Hypertension in his mother. ROS:   Please see the history of present illness.    All 14 point review of systems negative except as described per history of present illness.  EKGs/Labs/Other Studies Reviewed:    The following studies were reviewed today:   EKG:       Recent Labs: 03/24/2023: TSH  1.430 07/16/2023: BUN 31; Creatinine, Ser 1.39; Hemoglobin 16.4; Platelets 174; Potassium 4.4; Sodium 143  Recent Lipid Panel    Component Value Date/Time   CHOL 121 05/19/2023 0802   TRIG 186 (H) 05/19/2023 0802   HDL 27 (L) 05/19/2023 0802   CHOLHDL 4.5 05/19/2023 0802   CHOLHDL 5.2 11/22/2016 1048   VLDL 55 (H) 11/22/2016 1048   LDLCALC 63 05/19/2023 0802    Physical Exam:    VS:  BP 100/60   Pulse 62   Ht 6' (1.829 m)   Wt 240 lb 3.2 oz (109 kg)   SpO2 97%   BMI  32.58 kg/m     Wt Readings from Last 3 Encounters:  10/15/23 240 lb 3.2 oz (109 kg)  10/05/23 243 lb 9.6 oz (110.5 kg)  09/02/23 250 lb 6.4 oz (113.6 kg)     GENERAL:  Well nourished, well developed in no acute distress NECK: No JVD; No carotid bruits CARDIAC: RRR, S1 and S2 present, no murmurs, no rubs, no gallops CHEST:  Clear to auscultation without rales, wheezing or rhonchi  Extremities: No pitting pedal edema. Pulses bilaterally symmetric with radial 2+ and dorsalis pedis 2+ NEUROLOGIC:  Alert and oriented x 3  Medication Adjustments/Labs and Tests Ordered: Current medicines are reviewed at length with the patient today.  Concerns regarding medicines are outlined above.  No orders of the defined types were placed in this encounter.  Meds ordered this encounter  Medications   lisinopril  (ZESTRIL ) 5 MG tablet    Sig: Take 1 tablet (5 mg total) by mouth daily.    Dispense:  90 tablet    Refill:  3   torsemide  (DEMADEX ) 20 MG tablet    Sig: Take 1 tablet (20 mg total) by mouth every other day.    Dispense:  45 tablet    Refill:  3    Signed, Letticia Bhattacharyya reddy Mariama Saintvil, MD, MPH, [facility]. 10/15/2023 3:05 PM    [facility]

## 2023-10-15 NOTE — Assessment & Plan Note (Signed)
 Euvolemic, compensated. Good functional status NYHA class I. Continue with low-salt diet below 2 g/day. Advised avoiding caffeinated drinks and sodas and stick with regular water.  Continue with torsemide  will reduce the dose to 20 mg every other day to avoid orthostatic blood pressure changes.  Continue Jardiance  he is on diabetic dose 25 mg once daily. Continue metoprolol  tartrate 50 mg twice daily Continue lisinopril , lowered the dose to 5 mg once daily to avoid orthostatic blood pressure changes.

## 2023-10-15 NOTE — Assessment & Plan Note (Signed)
 Symptoms likely related to blood pressure changes with ongoing medications. Cannot exclude autonomic neuropathy in the setting of longstanding history of diabetes.  Lowered the dose of lisinopril  to 5 mg once daily. Previously lowered the dose of Imdur  to 30 mg once daily. In addition recommended he cut down torsemide  dose to every other day 20 mg.

## 2023-10-15 NOTE — Patient Instructions (Signed)
 Medication Instructions:  Your physician has recommended you make the following change in your medication:   START: Lisinopril  5 mg daily START: Torsemide  20 mg every other day  *If you need a refill on your cardiac medications before your next appointment, please call your pharmacy*  Lab Work: None If you have labs (blood work) drawn today and your tests are completely normal, you will receive your results only by: MyChart Message (if you have MyChart) OR A paper copy in the mail If you have any lab test that is abnormal or we need to change your treatment, we will call you to review the results.  Testing/Procedures: None  Follow-Up: At [facility], you and your health needs are our priority.  As part of our continuing mission to provide you with exceptional heart care, our providers are all part of one team.  This team includes your primary Cardiologist (physician) and Advanced Practice Providers or APPs (Physician Assistants and Nurse Practitioners) who all work together to provide you with the care you need, when you need it.  Your next appointment:   3 month(s)  Provider:   Alean Kobus, MD    We recommend signing up for the patient portal called MyChart.  Sign up information is provided on this After Visit Summary.  MyChart is used to connect with patients for Virtual Visits (Telemedicine).  Patients are able to view lab/test results, encounter notes, upcoming appointments, etc.  Non-urgent messages can be sent to your provider as well.   To learn more about what you can do with MyChart, go to [email or web address].   Other Instructions None

## 2023-10-15 NOTE — Assessment & Plan Note (Signed)
 Continues to use nicotine. Never smoked. Uses nicotine dip. Recommended to quit given the harmful effects. He acknowledges mentions he will work on it.

## 2023-10-20 ENCOUNTER — Other Ambulatory Visit: Payer: Self-pay | Admitting: Family Medicine

## 2023-10-26 ENCOUNTER — Ambulatory Visit (INDEPENDENT_AMBULATORY_CARE_PROVIDER_SITE_OTHER): Admitting: Family Medicine

## 2023-10-26 ENCOUNTER — Ambulatory Visit: Admitting: Physician Assistant

## 2023-10-26 ENCOUNTER — Encounter: Payer: Self-pay | Admitting: Family Medicine

## 2023-10-26 ENCOUNTER — Ambulatory Visit: Payer: Self-pay

## 2023-10-26 VITALS — BP 120/55 | HR 52 | Ht 72.0 in | Wt 241.0 lb

## 2023-10-26 DIAGNOSIS — M62838 Other muscle spasm: Secondary | ICD-10-CM | POA: Diagnosis not present

## 2023-10-26 NOTE — Progress Notes (Signed)
 Acute Office Visit  Subjective:     Patient ID: Benjamin Bush, male    DOB: Sep 05, 1955, 68 y.o.   MRN: [id number]  Chief Complaint  Patient presents with   Neck Pain   Shoulder Pain    HPI Patient is in today for right sided neck/shoulder pain.   Discussed the use of AI scribe software for clinical note transcription with the patient, who gave verbal consent to proceed.  History of Present Illness Benjamin Bush is a 68 year old male who presents with right upper trapezius pain following yard work.  He has been experiencing pain on the right side, radiating from the neck down to the shoulder, after performing yard work involving cutting and lifting limbs and vines approximately one week ago. No pop was heard or felt at the time of injury, but soreness developed two days later.  The pain is described as an 8 out of 10 in severity, persistent even at rest, and exacerbated by movements such as looking to the right or left, and attempting to touch his ear to his shoulder or chin to his armpit. The pain is localized to the right upper trapezius region.  He has been using Percocet and tizanidine for pain and muscle relaxation, respectively, but reports no relief from these medications. He has also tried using Icy Hot cream and a heating pad without significant improvement. The pain affects his sleep, allowing only three to four hours of rest despite taking sleep medications. He has been taking tizanidine twice daily, as prescribed by his pain doctor, but it does not provide adequate relief or induce sleepiness during the day.  He mentions using his right hand predominantly for tasks, as he is right-handed, and has been involved in activities such as pressure washing and clearing mold from an outdoor building, which may have contributed to his symptoms.  No significant weakness is noted, as grip strength is equal on both sides, and he is able to push and pull without difficulty. Both sides  hurt when turning his head, but the pain is worse on the right side.         ROS All review of systems negative except what is listed in the HPI      Objective:    BP (!) 120/55   Pulse (!) 52   Ht 6' (1.829 m)   Wt 241 lb (109.3 kg)   SpO2 98%   BMI 32.69 kg/m    Physical Exam Vitals reviewed.  Constitutional:      Appearance: Normal appearance.  Musculoskeletal:        General: Normal range of motion.     Cervical back: No bony tenderness.     Thoracic back: No bony tenderness.       Back:     Comments: Significant right upper trap tension, palpable knots, tender  Skin:    General: Skin is warm and dry.  Neurological:     Mental Status: He is oriented to person, place, and time.  Psychiatric:        Mood and Affect: Mood normal.        Behavior: Behavior normal.        Thought Content: Thought content normal.        Judgment: Judgment normal.        No results found for any visits on 10/26/23.      Assessment & Plan:   Problem List Items Addressed This Visit   None Visit Diagnoses  Muscle spasms of neck    -  Primary   Relevant Orders   Ambulatory referral to Physical Therapy       Assessment & Plan Right upper trapezius muscle strain with spasm Right upper trapezius strain/spasms, pain 8/10, worsened by movement. Ineffective pain management with current medications.  - Refer to PT for evaluation and treatment, including potential dry needling and massage therapy. - Increase tizanidine dosage to three times a day PRN temporarily. - Provide printed stretches for home use. - Advise use of heating pad three to four times daily followed by massage and stretches. - Instruct to remain active but avoid pain-exacerbating activities. - Consider further imaging or orthopedic referral if no improvement.      No orders of the defined types were placed in this encounter.   Return if symptoms worsen or fail to improve.  Waddell KATHEE Mon,  NP

## 2023-10-26 NOTE — Telephone Encounter (Signed)
 FYI Only or Action Required?: FYI only for provider.  Patient was last seen in primary care on 10/05/2023 by Frann Mabel Mt, DO.  Called Nurse Triage reporting Neck Pain, Shoulder Pain, and knot to back of neck.  Symptoms began a week ago.  Interventions attempted: Prescription medications: oxycontin  and Rest, hydration, or home remedies.  Symptoms are: gradually worsening.  Triage Disposition: See HCP Within 4 Hours (Or PCP Triage)  Patient/caregiver understands and will follow disposition?: Yes      Copied from CRM #[id number]. Topic: Clinical - Red Word Triage >> Oct 26, 2023  8:35 AM Suzen RAMAN wrote: Red Word that prompted transfer to Nurse Triage: neck and shoulder pain Reason for Disposition  [1] SEVERE neck pain (e.g., excruciating, unable to do any normal activities) AND [2] not improved after 2 hours of pain medicine  Answer Assessment - Initial Assessment Questions 1. ONSET: When did the pain begin?      about a week 2. LOCATION: Where does it hurt?      Neck and shoulder 3. PATTERN Does the pain come and go, or has it been constant since it started?     hurts every time move neck, shoulder hurts so much, taken pain meds but doesn't even touch it, oxycontin  4. SEVERITY: How bad is the pain?  (Scale 0-10; or none or slight stiffness, mild, moderate, severe)     8-9/10, even sitting here 5. RADIATION: Does the pain go anywhere else, shoot into your arms?     Just right side of neck down shoulder 6. CORD SYMPTOMS: Any weakness or numbness of the arms or legs?     no 8. NECK OVERUSE: Any recent activities that involved turning or twisting the neck?     Was out trimming bushes, think maybe it happened by reaching up cutting up limbs and stuff 9. OTHER SYMPTOMS: Do you have any other symptoms? (e.g., headache, fever, chest pain, difficulty breathing, neck swelling)     Interrupted sleep Any pressure to back of neck hurts like you know what No  headache Knot on back of neck right where goes up spine, hard, noticed 2 days ago No chest pain, SOB, fever, numbness/tingling   Advised pt be examined in ED if any worsening or new symptoms, advised pt be examined in next 4 hours, scheduled with PCP office this am.  Protocols used: Neck Pain or Stiffness-A-AH

## 2023-11-02 ENCOUNTER — Encounter: Payer: Self-pay | Admitting: Physician Assistant

## 2023-11-08 ENCOUNTER — Other Ambulatory Visit: Payer: Self-pay | Admitting: Internal Medicine

## 2023-11-08 DIAGNOSIS — E785 Hyperlipidemia, unspecified: Secondary | ICD-10-CM

## 2023-11-08 DIAGNOSIS — I25119 Atherosclerotic heart disease of native coronary artery with unspecified angina pectoris: Secondary | ICD-10-CM

## 2023-11-18 ENCOUNTER — Other Ambulatory Visit: Payer: Self-pay | Admitting: Family Medicine

## 2023-11-20 ENCOUNTER — Ambulatory Visit: Admitting: *Deleted

## 2023-11-20 VITALS — BP 110/65 | HR 71 | Temp 98.1°F | Resp 16 | Ht 72.0 in | Wt 236.8 lb

## 2023-11-20 DIAGNOSIS — Z23 Encounter for immunization: Secondary | ICD-10-CM | POA: Diagnosis not present

## 2023-11-20 DIAGNOSIS — Z0000 Encounter for general adult medical examination without abnormal findings: Secondary | ICD-10-CM | POA: Diagnosis not present

## 2023-11-20 MED ORDER — SERTRALINE HCL 50 MG PO TABS: 50.0000 mg | ORAL_TABLET | Freq: Every day | ORAL | 2 refills | Status: DC

## 2023-11-20 NOTE — Progress Notes (Signed)
 Please attest this visit in the absence of patient primary care provider.    Subjective:   Benjamin Bush is a 68 y.o. who presents for a Medicare Wellness preventive visit.  As a reminder, Annual Wellness Visits don't include a physical exam, and some assessments may be limited, especially if this visit is performed virtually. We may recommend an in-person follow-up visit with your provider if needed.  Visit Complete: In person   Persons Participating in Visit: Patient.  AWV Questionnaire: No: Patient Medicare AWV questionnaire was not completed prior to this visit.  Cardiac Risk Factors include: advanced age (>45men, >34 women);diabetes mellitus;dyslipidemia;hypertension;male gender;Other (see comment), Risk factor comments: CKD, COPD, CAD, CHF     Objective:    Today's Vitals   11/20/23 1122  Pulse: 71  Resp: 16  Temp: 98.1 F (36.7 C)  TempSrc: Oral  SpO2: 98%  Weight: 236 lb 12.8 oz (107.4 kg)  Height: 6' (1.829 m)   Body mass index is 32.12 kg/m.     11/20/2023   11:36 AM 11/21/2016   11:35 PM  Advanced Directives  Does Patient Have a Medical Advance Directive? Yes No   Type of Advance Directive Out of facility DNR (pink MOST or yellow form)   Does patient want to make changes to medical advance directive? No - Patient declined   Would patient like information on creating a medical advance directive?  No - Patient declined      Data saved with a previous flowsheet row definition    Current Medications (verified) Outpatient Encounter Medications as of 11/20/2023  Medication Sig   atorvastatin  (LIPITOR ) 40 MG tablet Take 1 tablet (40 mg total) by mouth daily.   clopidogrel  (PLAVIX ) 75 MG tablet Take 1 tablet (75 mg total) by mouth daily with breakfast.   empagliflozin  (JARDIANCE ) 25 MG TABS tablet Take 1 tablet (25 mg total) by mouth daily before breakfast.   Evolocumab  (REPATHA  SURECLICK) 140 MG/ML SOAJ Inject 140 mg into the skin every 14 (fourteen) days.    isosorbide  mononitrate (IMDUR ) 30 MG 24 hr tablet Take 1 tablet (30 mg total) by mouth daily.   lisinopril  (ZESTRIL ) 5 MG tablet Take 1 tablet (5 mg total) by mouth daily.   metoprolol  tartrate (LOPRESSOR ) 50 MG tablet Take 1 tablet (50 mg total) by mouth 2 (two) times daily.   nitroGLYCERIN  (NITROSTAT ) 0.4 MG SL tablet Place 1 tablet (0.4 mg total) under the tongue every 5 (five) minutes x 2 doses as needed for chest pain.   oxyCODONE -acetaminophen  (PERCOCET) 10-325 MG tablet Take 1 tablet by mouth 3 (three) times daily as needed.   ranolazine  (RANEXA ) 500 MG 12 hr tablet Take 500 mg by mouth 2 (two) times daily.   sucralfate  (CARAFATE ) 1 g tablet TAKE 1 TABLET BY MOUTH TWICE DAILY WITH A MEAL   testosterone  cypionate (DEPOTESTOSTERONE CYPIONATE) 200 MG/ML injection Inject into the muscle.   tiZANidine (ZANAFLEX) 4 MG tablet Take 4 mg by mouth every 12 (twelve) hours as needed.   torsemide  (DEMADEX ) 20 MG tablet Take 1 tablet (20 mg total) by mouth every other day.   [DISCONTINUED] sertraline  (ZOLOFT ) 50 MG tablet Take 1 tablet (50 mg total) by mouth daily.   sertraline  (ZOLOFT ) 50 MG tablet Take 1 tablet (50 mg total) by mouth daily.   No facility-administered encounter medications on file as of 11/20/2023.    Allergies (verified) Codeine   History: Past Medical History:  Diagnosis Date   (HFpEF) heart failure with preserved ejection  fraction (HCC)    a. 06/2016 Echo: Ef >55%, gr1 DD, dil RV w/ nl syst fxn, mildly dil LA.   Atrial septal defect 04/08/2013   BPH (benign prostatic hyperplasia) 11/17/2022   Bronchomalacia 11/08/2021   CAD (coronary artery disease)    a. s/p stenting x 2 (RCA/LCX)- [place], [place]; b. 2014 Cath: mild RCA/LCX ISR.   Chronic diastolic CHF (congestive heart failure) (HCC) 11/22/2016   Chronic pain disorder 06/25/2022   CKD (chronic kidney disease) stage 3, GFR 30-59 ml/min (HCC) 01/01/2017   COPD (chronic obstructive pulmonary disease) (HCC) 05/13/2013    Depression    Diabetes mellitus without complication (HCC)    Erectile dysfunction 11/17/2022   GERD (gastroesophageal reflux disease) 04/10/2012   Headache    History of coronary artery stent placement 01/01/2017   History of pulmonary embolus (PE) 11/22/2016   HTN (hypertension) 04/10/2012   Hx of percutaneous transcatheter closure of congenital ASD 01/01/2017   Hypercholesteremia    Hyperlipidemia associated with type 2 diabetes mellitus (HCC) 11/12/2014   Hypogonadism in male 11/17/2022   Insomnia 01/03/2022   Ischemic heart disease due to coronary artery obstruction 06/10/2022   Cath on 5/20 w/ PCI to mid LAD for in stent restonosis 80% of LAD. On plavix  and aspirin . Started on O2 while being monitored after this procedure. No recurrent angina, did go to ED for chest pain that was relieved w/ GI cocktail and increasing PPI.     Lesion of left native kidney 09/02/2023   Long-term current use of opiate analgesic 03/25/2021   Lumbar spondylosis 03/25/2021   Morbid obesity (HCC)    Neurocognitive disorder 03/16/2019   OA (osteoarthritis) of hip 03/03/2022   Orthostatic lightheadedness 08/28/2023   OSA (obstructive sleep apnea)    Osteoarthritis of knee 03/25/2021   Osteoarthritis of metacarpophalangeal (MCP) joint 05/01/2022   Chronic right hand pain across dorsum, pain with most movements. Suspect arthritis. XR today. Also has some carpal tunnel in that wrist, advised splint at night.     PFO (patent foramen ovale)    a. s/p percutaneous closure @ [name], [facility]).   PUD (peptic ulcer disease)    Recurrent falls 01/19/2019   Renal mass 06/10/2023   Suicidal ideation 03/16/2019   Type 2 diabetes mellitus with chronic kidney disease, with long-term current use of insulin  (HCC) 04/10/2016   50 units NPH twice daily, continue glipizide  10 twice daily  FBG have been improving, want to add a med or meal time. But costs have been issue. Will try to work on that.  Needs A1c < 7 if he  needs eventually to have a knee replacement     -try to start jardiance  through mail order     Last A1c:  Lab Results   Component Value Date    A1C 9.0 (H) 07/16/2022    A1C 9.6 (A) 06/10/2022    A1C 7.4 (A)    Past Surgical History:  Procedure Laterality Date   CORONARY ANGIOPLASTY WITH STENT PLACEMENT     HERNIA REPAIR     JOINT REPLACEMENT Left    knee replacement   PROSTATE SURGERY     unspec.   SHOULDER SURGERY     vetebral fusion     Family History  Problem Relation Age of Onset   CAD Mother        MI in her 80s   Hypertension Mother    CAD Father        CABG in his 59s  Social History   Socioeconomic History   Marital status: Divorced    Spouse name: Not on file   Number of children: 2   Years of education: Not on file   Highest education level: High school graduate  Occupational History    Comment: Disabled  Tobacco Use   Smoking status: Some Days    Types: Cigarettes   Smokeless tobacco: Current    Types: Chew   Tobacco comments:    Chews tobacco  Vaping Use   Vaping status: Never Used  Substance and Sexual Activity   Alcohol use: No   Drug use: No   Sexual activity: Yes  Other Topics Concern   Not on file  Social History Narrative   Not on file   Social Drivers of Health   Financial Resource Strain: Low Risk  (11/20/2023)   Overall Financial Resource Strain (CARDIA)    Difficulty of Paying Living Expenses: Not very hard  Food Insecurity: No Food Insecurity (11/20/2023)   Hunger Vital Sign    Worried About Running Out of Food in the Last Year: Never true    Ran Out of Food in the Last Year: Never true  Transportation Needs: No Transportation Needs (11/20/2023)   PRAPARE - Administrator, Civil Service (Medical): No    Lack of Transportation (Non-Medical): No  Physical Activity: Sufficiently Active (11/20/2023)   Exercise Vital Sign    Days of Exercise per Week: 7 days    Minutes of Exercise per Session: 40 min  Stress: Stress Concern  Present (11/20/2023)   [employer] of Occupational Health - Occupational Stress Questionnaire    Feeling of Stress: Rather much  Social Connections: Moderately Isolated (11/20/2023)   Social Connection and Isolation Panel    Frequency of Communication with Friends and Family: Once a week    Frequency of Social Gatherings with Friends and Family: More than three times a week    Attends Religious Services: More than 4 times per year    Active Member of [employer] or Organizations: No    Attends Engineer, structural: Never    Marital Status: Divorced    Tobacco Counseling Ready to quit: Not Answered Counseling given: Not Answered Tobacco comments: Chews tobacco    Clinical Intake:  Pre-visit preparation completed: Yes        BMI - recorded: 32.12 Nutritional Status: BMI > 30  Obese Nutritional Risks: None Diabetes: Yes CBG done?: No Did pt. bring in CBG monitor from home?: No  Lab Results  Component Value Date   HGBA1C 6.5 (H) 07/16/2023   HGBA1C 7.9 (H) 03/24/2023   HGBA1C 7.2 (H) 11/22/2016     How often do you need to have someone help you when you read instructions, pamphlets, or other written materials from your doctor or pharmacy?: 1 - Never What is the last grade level you completed in school?: 12  Interpreter Needed?: No  Information entered by :: Toi Stelly, CMA(AAMA)   Activities of Daily Living     11/20/2023   11:29 AM  In your present state of health, do you have any difficulty performing the following activities:  Hearing? 1  Vision? 0  Difficulty concentrating or making decisions? 0  Walking or climbing stairs? 0  Dressing or bathing? 0  Doing errands, shopping? 0  Preparing Food and eating ? N  Using the Toilet? N  In the past six months, have you accidently leaked urine? N  Do you have problems with loss  of bowel control? N  Managing your Medications? N  Managing your Finances? N  Housekeeping or managing your Housekeeping?  N    Patient Care Team: Cyndi Shaver, PA-C (Inactive) as PCP - General (Physician Assistant)  I have updated your Care Teams any recent Medical Services you may have received from other providers in the past year.     Assessment:   This is a routine wellness examination for Benjamin Bush.  Hearing/Vision screen Hearing Screening - Comments:: No hearing in left ear, insurance won't cover hearing aids. Vision Screening - Comments:: Past due for eye exam. Plans to estalish with someone his girlfriend sees.   Goals Addressed   None    Depression Screen     11/20/2023   11:32 AM 05/27/2023    8:54 AM 03/20/2023    9:23 AM  PHQ 2/9 Scores  PHQ - 2 Score 0 0 0  PHQ- 9 Score 9 6 1     Fall Risk     11/20/2023   11:39 AM  Fall Risk   Falls in the past year? 0  Number falls in past yr: 0  Injury with Fall? 0  Risk for fall due to : Other (Comment)  Risk for fall due to: Comment has neuropathy  Follow up Education provided    MEDICARE RISK AT HOME:  Medicare Risk at Home Any stairs in or around the home?: Yes If so, are there any without handrails?: No Home free of loose throw rugs in walkways, pet beds, electrical cords, etc?: Yes Adequate lighting in your home to reduce risk of falls?: No Life alert?: No Use of a cane, walker or w/c?: No Grab bars in the bathroom?: Yes Shower chair or bench in shower?: No Elevated toilet seat or a handicapped toilet?: No  TIMED UP AND GO:  Was the test performed?  Yes  Length of time to ambulate 10 feet: 6 sec Gait steady and fast without use of assistive device  Cognitive Function: 6CIT completed        11/20/2023   11:39 AM  6CIT Screen  What Year? 0 points  What month? 0 points  What time? 0 points  Count back from 20 2 points  Months in reverse 0 points  Repeat phrase 0 points  Total Score 2 points    Immunizations Immunization History  Administered Date(s) Administered   Fluzone Influenza virus vaccine,trivalent  (IIV3), split virus 11/16/2012, 10/25/2014, 11/19/2015, 12/10/2016, 10/20/2017, 10/20/2018, 11/15/2019, 12/17/2020   Influenza Inj Mdck Quad With Preservative 10/20/2018, 11/15/2019   Influenza, Seasonal, Injecte, Preservative Fre 04/24/2012   Influenza,inj,Quad PF,6+ Mos 11/09/2021   Influenza-Unspecified 02/18/2016, 11/22/2018, 11/16/2022   Moderna Sars-Covid-2 Vaccination 06/01/2019   PNEUMOCOCCAL CONJUGATE-20 11/17/2022   Pfizer(Comirnaty)Fall Seasonal Vaccine 12 years and older 11/17/2022   Pneumococcal Conjugate-13 11/17/2016   Pneumococcal Polysaccharide-23 11/16/2012   RSV,unspecified 01/01/2022   Tdap 04/07/2022   Zoster Recombinant(Shingrix) 01/01/2022, 11/16/2022    Screening Tests Health Maintenance  Topic Date Due   Medicare Annual Wellness (AWV)  Never done   OPHTHALMOLOGY EXAM  Never done   Hepatitis C Screening  Never done   Influenza Vaccine  09/18/2023   COVID-19 Vaccine (3 - 2024-25 season) 10/19/2023   HEMOGLOBIN A1C  01/16/2024   Diabetic kidney evaluation - eGFR measurement  07/15/2024   FOOT EXAM  07/15/2024   Diabetic kidney evaluation - Urine ACR  08/17/2024   Fecal DNA (Cologuard)  06/10/2026   DTaP/Tdap/Td (2 - Td or Tdap) 04/07/2032   Pneumococcal Vaccine:  50+ Years  Completed   Zoster Vaccines- Shingrix  Completed   HPV VACCINES  Aged Out   Meningococcal B Vaccine  Aged Out    Health Maintenance Items Addressed: Flu vaccine given today. Past due for DM eye exam and will schedule soon. Will consider Hep C screen at next OV  Additional Screening:  Vision Screening: Recommended annual ophthalmology exams for early detection of glaucoma and other disorders of the eye. Is the patient up to date with their annual eye exam?  No  Who is the provider or what is the name of the office in which the patient attends annual eye exams? Will schedule with new doctor that girlfriend sees but he doesn't know the name.  Dental Screening: Recommended annual dental  exams for proper oral hygiene  Community Resource Referral / Chronic Care Management: CRR required this visit?  No   CCM required this visit?  No   Plan:    I have personally reviewed and noted the following in the patient's chart:   Medical and social history Use of alcohol, tobacco or illicit drugs  Current medications and supplements including opioid prescriptions. Patient is currently taking opioid prescriptions. Information provided to patient regarding non-opioid alternatives. Patient advised to discuss non-opioid treatment plan with their provider. Functional ability and status Nutritional status Physical activity Advanced directives List of other physicians Hospitalizations, surgeries, and ER visits in previous 12 months Vitals Screenings to include cognitive, depression, and falls Referrals and appointments  In addition, I have reviewed and discussed with patient certain preventive protocols, quality metrics, and best practice recommendations. A written personalized care plan for preventive services as well as general preventive health recommendations were provided to patient.   Lolita Libra, CMA   11/20/2023   After Visit Summary: (In Person-Printed) AVS printed and given to the patient  Notes: Nothing significant to report at this time.

## 2023-11-20 NOTE — Patient Instructions (Addendum)
 [name] , Thank you for taking time out of your busy schedule to complete your Annual Wellness Visit with me. I enjoyed our conversation and look forward to speaking with you again next year. I, as well as your care team,  appreciate your ongoing commitment to your health goals. Please review the following plan we discussed and let me know if I can assist you in the future. Your Game plan/ To Do List    Referrals: If you haven't heard from the office you've been referred to, please reach out to them at the phone provided.   Please schedule your diabetic eye exam and have them send us  the report.  Follow up Visits: Next Medicare AWV with our clinical staff: 11/24/24 11am, telephone.     Next Office Visit with your provider: 12/03/23 11am, Harlene Jolly, NP   Clinician Recommendations:  Aim for 30 minutes of exercise or brisk walking, 6-8 glasses of water, and 5 servings of fruits and vegetables each day.       This is a list of the screening recommended for you and due dates:  Health Maintenance  Topic Date Due   Medicare Annual Wellness Visit  Never done   Eye exam for diabetics  Never done   Hepatitis C Screening  Never done   Flu Shot  09/18/2023   COVID-19 Vaccine (3 - 2024-25 season) 10/19/2023   Hemoglobin A1C  01/16/2024   Yearly kidney function blood test for diabetes  07/15/2024   Complete foot exam   07/15/2024   Yearly kidney health urinalysis for diabetes  08/17/2024   Cologuard (Stool DNA test)  06/10/2026   DTaP/Tdap/Td vaccine (2 - Td or Tdap) 04/07/2032   Pneumococcal Vaccine for age over 42  Completed   Zoster (Shingles) Vaccine  Completed   HPV Vaccine  Aged Out   Meningitis B Vaccine  Aged Out    Advanced directives: (Copy Requested) Please bring a copy of your health care power of attorney and living will to the office to be added to your chart at your convenience. You can mail to [facility] [address] [place], [place] [address] or email  to [email or web address] Advance Care Planning is important because it:  [x]  Makes sure you receive the medical care that is consistent with your values, goals, and preferences  [x]  It provides guidance to your family and loved ones and reduces their decisional burden about whether or not they are making the right decisions based on your wishes.  Follow the link provided in your after visit summary or read over the paperwork we have mailed to you to help you started getting your Advance Directives in place. If you need assistance in completing these, please reach out to us  so that we can help you!  See attachments for Preventive Care and Fall Prevention Tips.

## 2023-11-23 DIAGNOSIS — E1165 Type 2 diabetes mellitus with hyperglycemia: Secondary | ICD-10-CM | POA: Diagnosis not present

## 2023-11-23 DIAGNOSIS — E782 Mixed hyperlipidemia: Secondary | ICD-10-CM | POA: Diagnosis not present

## 2023-11-25 DIAGNOSIS — E782 Mixed hyperlipidemia: Secondary | ICD-10-CM | POA: Diagnosis not present

## 2023-11-25 DIAGNOSIS — E1165 Type 2 diabetes mellitus with hyperglycemia: Secondary | ICD-10-CM | POA: Diagnosis not present

## 2023-12-02 NOTE — Progress Notes (Unsigned)
 Subjective:     Patient ID: Benjamin Bush, male    DOB: May 09, 1955, 68 y.o.   MRN: [id number]  No chief complaint on file.   HPI  Discussed the use of AI scribe software for clinical note transcription with the patient, who gave verbal consent to proceed.  Follows with cardiology, orthopedics??  Physical therapy for neck pain?  PMHx-OSA, neurocognitive disorder, CAD  Plavix --why? Oxycodone ? Pan manf Testosterone ?  Specialists  HTN-, Toprol  5 mg daily, meToprolol  50 mg twice daily  CAD-Imdore 30 mg daily  HLD-atorvastatin  40 mg daily, Repatha   DM type II-Jardiance  25 mg daily  Depression and anxiety-Zoloft  50 mg daily   History of Present Illness              Health Maintenance Due  Topic Date Due   OPHTHALMOLOGY EXAM  Never done   Hepatitis C Screening  Never done   COVID-19 Vaccine (3 - 2025-26 season) 10/19/2023    Past Medical History:  Diagnosis Date   (HFpEF) heart failure with preserved ejection fraction (HCC)    a. 06/2016 Echo: Ef >55%, gr1 DD, dil RV w/ nl syst fxn, mildly dil LA.   Atrial septal defect 04/08/2013   BPH (benign prostatic hyperplasia) 11/17/2022   Bronchomalacia 11/08/2021   CAD (coronary artery disease)    a. s/p stenting x 2 (RCA/LCX)- [place], [place]; b. 2014 Cath: mild RCA/LCX ISR.   Chronic diastolic CHF (congestive heart failure) (HCC) 11/22/2016   Chronic pain disorder 06/25/2022   CKD (chronic kidney disease) stage 3, GFR 30-59 ml/min (HCC) 01/01/2017   COPD (chronic obstructive pulmonary disease) (HCC) 05/13/2013   Depression    Diabetes mellitus without complication (HCC)    Erectile dysfunction 11/17/2022   GERD (gastroesophageal reflux disease) 04/10/2012   Headache    History of coronary artery stent placement 01/01/2017   History of pulmonary embolus (PE) 11/22/2016   HTN (hypertension) 04/10/2012   Hx of percutaneous transcatheter closure of congenital ASD 01/01/2017   Hypercholesteremia    Hyperlipidemia  associated with type 2 diabetes mellitus (HCC) 11/12/2014   Hypogonadism in male 11/17/2022   Insomnia 01/03/2022   Ischemic heart disease due to coronary artery obstruction 06/10/2022   Cath on 5/20 w/ PCI to mid LAD for in stent restonosis 80% of LAD. On plavix  and aspirin . Started on O2 while being monitored after this procedure. No recurrent angina, did go to ED for chest pain that was relieved w/ GI cocktail and increasing PPI.     Lesion of left native kidney 09/02/2023   Long-term current use of opiate analgesic 03/25/2021   Lumbar spondylosis 03/25/2021   Morbid obesity (HCC)    Neurocognitive disorder 03/16/2019   OA (osteoarthritis) of hip 03/03/2022   Orthostatic lightheadedness 08/28/2023   OSA (obstructive sleep apnea)    Osteoarthritis of knee 03/25/2021   Osteoarthritis of metacarpophalangeal (MCP) joint 05/01/2022   Chronic right hand pain across dorsum, pain with most movements. Suspect arthritis. XR today. Also has some carpal tunnel in that wrist, advised splint at night.     PFO (patent foramen ovale)    a. s/p percutaneous closure @ [name], [facility]).   PUD (peptic ulcer disease)    Recurrent falls 01/19/2019   Renal mass 06/10/2023   Suicidal ideation 03/16/2019   Type 2 diabetes mellitus with chronic kidney disease, with long-term current use of insulin  (HCC) 04/10/2016   50 units NPH twice daily, continue glipizide  10 twice daily  FBG have been improving, want  to add a med or meal time. But costs have been issue. Will try to work on that.  Needs A1c < 7 if he needs eventually to have a knee replacement     -try to start jardiance  through mail order     Last A1c:  Lab Results   Component Value Date    A1C 9.0 (H) 07/16/2022    A1C 9.6 (A) 06/10/2022    A1C 7.4 (A)     Past Surgical History:  Procedure Laterality Date   CORONARY ANGIOPLASTY WITH STENT PLACEMENT     HERNIA REPAIR     JOINT REPLACEMENT Left    knee replacement   PROSTATE SURGERY     unspec.    SHOULDER SURGERY     vetebral fusion      Family History  Problem Relation Age of Onset   CAD Mother        MI in her 19s   Hypertension Mother    CAD Father        CABG in his 29s    Social History   Socioeconomic History   Marital status: Divorced    Spouse name: Not on file   Number of children: 2   Years of education: Not on file   Highest education level: High school graduate  Occupational History    Comment: Disabled  Tobacco Use   Smoking status: Some Days    Types: Cigarettes   Smokeless tobacco: Current    Types: Chew   Tobacco comments:    Chews tobacco  Vaping Use   Vaping status: Never Used  Substance and Sexual Activity   Alcohol use: No   Drug use: No   Sexual activity: Yes  Other Topics Concern   Not on file  Social History Narrative   Not on file   Social Drivers of Health   Financial Resource Strain: Low Risk  (11/20/2023)   Overall Financial Resource Strain (CARDIA)    Difficulty of Paying Living Expenses: Not very hard  Food Insecurity: No Food Insecurity (11/20/2023)   Hunger Vital Sign    Worried About Running Out of Food in the Last Year: Never true    Ran Out of Food in the Last Year: Never true  Transportation Needs: No Transportation Needs (11/20/2023)   PRAPARE - Administrator, Civil Service (Medical): No    Lack of Transportation (Non-Medical): No  Physical Activity: Sufficiently Active (11/20/2023)   Exercise Vital Sign    Days of Exercise per Week: 7 days    Minutes of Exercise per Session: 40 min  Stress: Stress Concern Present (11/20/2023)   [employer] of Occupational Health - Occupational Stress Questionnaire    Feeling of Stress: Rather much  Social Connections: Moderately Isolated (11/20/2023)   Social Connection and Isolation Panel    Frequency of Communication with Friends and Family: Once a week    Frequency of Social Gatherings with Friends and Family: More than three times a week    Attends  Religious Services: More than 4 times per year    Active Member of [employer] or Organizations: No    Attends Banker Meetings: Never    Marital Status: Divorced  Catering manager Violence: Not At Risk (11/20/2023)   Humiliation, Afraid, Rape, and Kick questionnaire    Fear of Current or Ex-Partner: No    Emotionally Abused: No    Physically Abused: No    Sexually Abused: No    Outpatient Medications Prior  to Visit  Medication Sig Dispense Refill   atorvastatin  (LIPITOR ) 40 MG tablet Take 1 tablet (40 mg total) by mouth daily. 90 tablet 3   clopidogrel  (PLAVIX ) 75 MG tablet Take 1 tablet (75 mg total) by mouth daily with breakfast. 90 tablet 3   empagliflozin  (JARDIANCE ) 25 MG TABS tablet Take 1 tablet (25 mg total) by mouth daily before breakfast. 30 tablet 2   Evolocumab  (REPATHA  SURECLICK) 140 MG/ML SOAJ Inject 140 mg into the skin every 14 (fourteen) days. 6 mL 1   isosorbide  mononitrate (IMDUR ) 30 MG 24 hr tablet Take 1 tablet (30 mg total) by mouth daily. 90 tablet 3   lisinopril  (ZESTRIL ) 5 MG tablet Take 1 tablet (5 mg total) by mouth daily. 90 tablet 3   metoprolol  tartrate (LOPRESSOR ) 50 MG tablet Take 1 tablet (50 mg total) by mouth 2 (two) times daily. 180 tablet 3   nitroGLYCERIN  (NITROSTAT ) 0.4 MG SL tablet Place 1 tablet (0.4 mg total) under the tongue every 5 (five) minutes x 2 doses as needed for chest pain. 30 tablet 3   oxyCODONE -acetaminophen  (PERCOCET) 10-325 MG tablet Take 1 tablet by mouth 3 (three) times daily as needed.     ranolazine  (RANEXA ) 500 MG 12 hr tablet Take 500 mg by mouth 2 (two) times daily.     sertraline  (ZOLOFT ) 50 MG tablet Take 1 tablet (50 mg total) by mouth daily. 30 tablet 2   sucralfate  (CARAFATE ) 1 g tablet TAKE 1 TABLET BY MOUTH TWICE DAILY WITH A MEAL 60 tablet 0   testosterone  cypionate (DEPOTESTOSTERONE CYPIONATE) 200 MG/ML injection Inject into the muscle.     tiZANidine (ZANAFLEX) 4 MG tablet Take 4 mg by mouth every 12 (twelve)  hours as needed.     torsemide  (DEMADEX ) 20 MG tablet Take 1 tablet (20 mg total) by mouth every other day. 45 tablet 3   No facility-administered medications prior to visit.    Allergies  Allergen Reactions   Codeine Swelling    ROS     Objective:    Physical Exam   There were no vitals taken for this visit. Wt Readings from Last 3 Encounters:  11/20/23 236 lb 12.8 oz (107.4 kg)  10/26/23 241 lb (109.3 kg)  10/15/23 240 lb 3.2 oz (109 kg)       Assessment & Plan:   Problem List Items Addressed This Visit   None   I am having Daril IVAR Sharps maintain his tiZANidine, testosterone  cypionate, clopidogrel , nitroGLYCERIN , atorvastatin , Repatha  SureClick, oxyCODONE -acetaminophen , ranolazine , metoprolol  tartrate, sucralfate , isosorbide  mononitrate, empagliflozin , lisinopril , torsemide , and sertraline .  No orders of the defined types were placed in this encounter.

## 2023-12-03 ENCOUNTER — Ambulatory Visit: Admitting: Student

## 2023-12-03 ENCOUNTER — Encounter: Payer: Self-pay | Admitting: Student

## 2023-12-03 ENCOUNTER — Encounter: Payer: Self-pay | Admitting: *Deleted

## 2023-12-03 VITALS — BP 138/77 | HR 52 | Temp 97.8°F | Resp 12 | Ht 72.0 in | Wt 238.6 lb

## 2023-12-03 DIAGNOSIS — M47816 Spondylosis without myelopathy or radiculopathy, lumbar region: Secondary | ICD-10-CM | POA: Diagnosis not present

## 2023-12-03 DIAGNOSIS — G894 Chronic pain syndrome: Secondary | ICD-10-CM

## 2023-12-03 DIAGNOSIS — G4733 Obstructive sleep apnea (adult) (pediatric): Secondary | ICD-10-CM

## 2023-12-03 DIAGNOSIS — Z794 Long term (current) use of insulin: Secondary | ICD-10-CM

## 2023-12-03 DIAGNOSIS — I5032 Chronic diastolic (congestive) heart failure: Secondary | ICD-10-CM | POA: Diagnosis not present

## 2023-12-03 DIAGNOSIS — K429 Umbilical hernia without obstruction or gangrene: Secondary | ICD-10-CM

## 2023-12-03 DIAGNOSIS — H9192 Unspecified hearing loss, left ear: Secondary | ICD-10-CM

## 2023-12-03 DIAGNOSIS — F339 Major depressive disorder, recurrent, unspecified: Secondary | ICD-10-CM

## 2023-12-03 DIAGNOSIS — E1122 Type 2 diabetes mellitus with diabetic chronic kidney disease: Secondary | ICD-10-CM | POA: Diagnosis not present

## 2023-12-03 DIAGNOSIS — I10 Essential (primary) hypertension: Secondary | ICD-10-CM | POA: Diagnosis not present

## 2023-12-03 DIAGNOSIS — N183 Chronic kidney disease, stage 3 unspecified: Secondary | ICD-10-CM

## 2023-12-03 DIAGNOSIS — R7989 Other specified abnormal findings of blood chemistry: Secondary | ICD-10-CM | POA: Diagnosis not present

## 2023-12-03 DIAGNOSIS — I25119 Atherosclerotic heart disease of native coronary artery with unspecified angina pectoris: Secondary | ICD-10-CM

## 2023-12-03 NOTE — Assessment & Plan Note (Signed)
Refer to Urology for further evaluation.

## 2023-12-03 NOTE — Assessment & Plan Note (Addendum)
 Stable with Zoloft  50 mh daily. Denies SI/HI.

## 2023-12-03 NOTE — Assessment & Plan Note (Signed)
 Sees pain management- [facility] in [place].

## 2023-12-03 NOTE — Assessment & Plan Note (Addendum)
 Chronic. Sees pain management. Pain level 7-8/10, exacerbated by sitting and lying down.  - Refer to orthopedics for possible injections, further evaluation.

## 2023-12-03 NOTE — Assessment & Plan Note (Signed)
 Hearing loss in left ear, was previously recommendation for hearing aids years ago per Pt, but insurance  did not cover at the time. - Refer to audiology for hearing test and hearing aid evaluation.

## 2023-12-03 NOTE — Assessment & Plan Note (Addendum)
 Well controlled, no changes to meds. Encouraged heart healthy diet such as the DASH diet and exercise as tolerated.  On Imdur , Lisinopril , Metoprolol  BID.

## 2023-12-03 NOTE — Assessment & Plan Note (Addendum)
 Benjamin Bush

## 2023-12-03 NOTE — Assessment & Plan Note (Signed)
 Euvolemic today. Imdur , and torsemide  20 mg daily   Monitor fluid status and weight.

## 2023-12-03 NOTE — Assessment & Plan Note (Signed)
 No longer uses cpap machine. S/p significant weight loss Could consider repeat sleep study

## 2023-12-03 NOTE — Assessment & Plan Note (Signed)
 Last A1c 7.5% Following with Endocrine.  Metformin  d/c r/t GI Ses. Jardiance  increased by Endocrine to 25 mg daily. On statin, repatha , and acei.  Has dm neuropathy, used to take gabapentin  now manageable without medication, dm CKD. Goal is A1c < 7%.  Continue FU with endocrine.

## 2023-12-07 DIAGNOSIS — E1142 Type 2 diabetes mellitus with diabetic polyneuropathy: Secondary | ICD-10-CM | POA: Insufficient documentation

## 2023-12-22 ENCOUNTER — Ambulatory Visit: Admitting: Physical Medicine and Rehabilitation

## 2023-12-22 NOTE — Progress Notes (Unsigned)
 Chief Complaint:  HPI:    Mr. Kedzierski is a 68 year old male with a past medical history as listed below including heart failure after CAD with stenting on Plavix  (08/28/2023 echo with LVEF 60-65%) who was referred to me by Frann Mabel Mt* for a complaint of *** .      06/10/23 Cologuard negative.    06/19/2023 CTAP without contrast done for abdominal pain showed no acute findings in the abdomen or pelvis.    11/25/23 TSH normal, CBC normal, cortisol normal, BMP with a creatinine of 1.32 and a glucose of 142.    12/03/2023 patient seen by PCP.  At that time discussed experiencing GI distress from metformin  which led to its discontinuation.  On Jardiance  for diabetes.  At that time ongoing diarrhea, gas and bloating the activities.  Chronic low back pain.  At that time recommended to try Metamucil and adequate hydration.  Past Medical History:  Diagnosis Date   (HFpEF) heart failure with preserved ejection fraction (HCC)    a. 06/2016 Echo: Ef >55%, gr1 DD, dil RV w/ nl syst fxn, mildly dil LA.   Atrial septal defect 04/08/2013   BPH (benign prostatic hyperplasia) 11/17/2022   Bronchomalacia 11/08/2021   CAD (coronary artery disease)    a. s/p stenting x 2 (RCA/LCX)- [place], [place]; b. 2014 Cath: mild RCA/LCX ISR.   Chronic diastolic CHF (congestive heart failure) (HCC) 11/22/2016   Chronic pain disorder 06/25/2022   CKD (chronic kidney disease) stage 3, GFR 30-59 ml/min (HCC) 01/01/2017   COPD (chronic obstructive pulmonary disease) (HCC) 05/13/2013   Depression    Diabetes mellitus without complication (HCC)    Erectile dysfunction 11/17/2022   GERD (gastroesophageal reflux disease) 04/10/2012   Headache    History of coronary artery stent placement 01/01/2017   History of pulmonary embolus (PE) 11/22/2016   HTN (hypertension) 04/10/2012   Hx of percutaneous transcatheter closure of congenital ASD 01/01/2017   Hypercholesteremia    Hyperlipidemia associated with type 2 diabetes  mellitus (HCC) 11/12/2014   Hypogonadism in male 11/17/2022   Insomnia 01/03/2022   Ischemic heart disease due to coronary artery obstruction 06/10/2022   Cath on 5/20 w/ PCI to mid LAD for in stent restonosis 80% of LAD. On plavix  and aspirin . Started on O2 while being monitored after this procedure. No recurrent angina, did go to ED for chest pain that was relieved w/ GI cocktail and increasing PPI.     Lesion of left native kidney 09/02/2023   Long-term current use of opiate analgesic 03/25/2021   Lumbar spondylosis 03/25/2021   Morbid obesity (HCC)    Neurocognitive disorder 03/16/2019   OA (osteoarthritis) of hip 03/03/2022   Orthostatic lightheadedness 08/28/2023   OSA (obstructive sleep apnea)    Osteoarthritis of knee 03/25/2021   Osteoarthritis of metacarpophalangeal (MCP) joint 05/01/2022   Chronic right hand pain across dorsum, pain with most movements. Suspect arthritis. XR today. Also has some carpal tunnel in that wrist, advised splint at night.     PFO (patent foramen ovale)    a. s/p percutaneous closure @ [name], [facility]).   PUD (peptic ulcer disease)    Recurrent falls 01/19/2019   Renal mass 06/10/2023   Suicidal ideation 03/16/2019   Type 2 diabetes mellitus with chronic kidney disease, with long-term current use of insulin  (HCC) 04/10/2016   50 units NPH twice daily, continue glipizide  10 twice daily  FBG have been improving, want to add a med or meal time. But costs have  been issue. Will try to work on that.  Needs A1c < 7 if he needs eventually to have a knee replacement     -try to start jardiance  through mail order     Last A1c:  Lab Results   Component Value Date    A1C 9.0 (H) 07/16/2022    A1C 9.6 (A) 06/10/2022    A1C 7.4 (A)     Past Surgical History:  Procedure Laterality Date   CORONARY ANGIOPLASTY WITH STENT PLACEMENT     HERNIA REPAIR     JOINT REPLACEMENT Left    knee replacement   PROSTATE SURGERY     unspec.   SHOULDER SURGERY     vetebral  fusion      Current Outpatient Medications  Medication Sig Dispense Refill   atorvastatin  (LIPITOR ) 40 MG tablet Take 1 tablet (40 mg total) by mouth daily. 90 tablet 3   clopidogrel  (PLAVIX ) 75 MG tablet Take 1 tablet (75 mg total) by mouth daily with breakfast. 90 tablet 3   empagliflozin  (JARDIANCE ) 25 MG TABS tablet Take 1 tablet (25 mg total) by mouth daily before breakfast. 30 tablet 2   Evolocumab  (REPATHA  SURECLICK) 140 MG/ML SOAJ Inject 140 mg into the skin every 14 (fourteen) days. 6 mL 1   isosorbide  mononitrate (IMDUR ) 30 MG 24 hr tablet Take 1 tablet (30 mg total) by mouth daily. 90 tablet 3   lisinopril  (ZESTRIL ) 5 MG tablet Take 1 tablet (5 mg total) by mouth daily. 90 tablet 3   metoprolol  tartrate (LOPRESSOR ) 50 MG tablet Take 1 tablet (50 mg total) by mouth 2 (two) times daily. 180 tablet 3   nitroGLYCERIN  (NITROSTAT ) 0.4 MG SL tablet Place 1 tablet (0.4 mg total) under the tongue every 5 (five) minutes x 2 doses as needed for chest pain. 30 tablet 3   oxyCODONE -acetaminophen  (PERCOCET) 10-325 MG tablet Take 1 tablet by mouth 3 (three) times daily as needed.     ranolazine  (RANEXA ) 500 MG 12 hr tablet Take 500 mg by mouth 2 (two) times daily.     sertraline  (ZOLOFT ) 50 MG tablet Take 1 tablet (50 mg total) by mouth daily. 30 tablet 2   sucralfate  (CARAFATE ) 1 g tablet TAKE 1 TABLET BY MOUTH TWICE DAILY WITH A MEAL 60 tablet 0   testosterone  cypionate (DEPOTESTOSTERONE CYPIONATE) 200 MG/ML injection Inject into the muscle.     tiZANidine (ZANAFLEX) 4 MG tablet Take 4 mg by mouth every 12 (twelve) hours as needed.     torsemide  (DEMADEX ) 20 MG tablet Take 1 tablet (20 mg total) by mouth every other day. 45 tablet 3   No current facility-administered medications for this visit.    Allergies as of 12/23/2023 - Review Complete 12/03/2023  Allergen Reaction Noted   Codeine Swelling 02/24/2013    Family History  Problem Relation Age of Onset   CAD Mother        MI in her  27s   Hypertension Mother    CAD Father        CABG in his 18s    Social History   Socioeconomic History   Marital status: Divorced    Spouse name: Not on file   Number of children: 2   Years of education: Not on file   Highest education level: High school graduate  Occupational History    Comment: Disabled  Tobacco Use   Smoking status: Some Days    Types: Cigarettes   Smokeless tobacco: Current  Types: Chew   Tobacco comments:    Chews tobacco  Vaping Use   Vaping status: Never Used  Substance and Sexual Activity   Alcohol use: No   Drug use: No   Sexual activity: Yes  Other Topics Concern   Not on file  Social History Narrative   Not on file   Social Drivers of Health   Financial Resource Strain: Low Risk  (11/20/2023)   Overall Financial Resource Strain (CARDIA)    Difficulty of Paying Living Expenses: Not very hard  Food Insecurity: No Food Insecurity (11/20/2023)   Hunger Vital Sign    Worried About Running Out of Food in the Last Year: Never true    Ran Out of Food in the Last Year: Never true  Transportation Needs: No Transportation Needs (11/20/2023)   PRAPARE - Administrator, Civil Service (Medical): No    Lack of Transportation (Non-Medical): No  Physical Activity: Sufficiently Active (11/20/2023)   Exercise Vital Sign    Days of Exercise per Week: 7 days    Minutes of Exercise per Session: 40 min  Stress: Stress Concern Present (11/20/2023)   [employer] of Occupational Health - Occupational Stress Questionnaire    Feeling of Stress: Rather much  Social Connections: Moderately Isolated (11/20/2023)   Social Connection and Isolation Panel    Frequency of Communication with Friends and Family: Once a week    Frequency of Social Gatherings with Friends and Family: More than three times a week    Attends Religious Services: More than 4 times per year    Active Member of [employer] or Organizations: No    Attends Banker  Meetings: Never    Marital Status: Divorced  Catering Manager Violence: Not At Risk (11/20/2023)   Humiliation, Afraid, Rape, and Kick questionnaire    Fear of Current or Ex-Partner: No    Emotionally Abused: No    Physically Abused: No    Sexually Abused: No    Review of Systems:    Constitutional: No weight loss, fever, chills, weakness or fatigue HEENT: Eyes: No change in vision               Ears, Nose, Throat:  No change in hearing or congestion Skin: No rash or itching Cardiovascular: No chest pain, chest pressure or palpitations   Respiratory: No SOB or cough Gastrointestinal: See HPI and otherwise negative Genitourinary: No dysuria or change in urinary frequency Neurological: No headache, dizziness or syncope Musculoskeletal: No new muscle or joint pain Hematologic: No bleeding or bruising Psychiatric: No history of depression or anxiety    Physical Exam:  Vital signs: There were no vitals taken for this visit.  Constitutional:   Pleasant Caucasian male appears to be in NAD, Well developed, Well nourished, alert and cooperative Head:  Normocephalic and atraumatic. Eyes:   PEERL, EOMI. No icterus. Conjunctiva pink. Ears:  Normal auditory acuity. Neck:  Supple Throat: Oral cavity and pharynx without inflammation, swelling or lesion.  Respiratory: Respirations even and unlabored. Lungs clear to auscultation bilaterally.   No wheezes, crackles, or rhonchi.  Cardiovascular: Normal S1, S2. No MRG. Regular rate and rhythm. No peripheral edema, cyanosis or pallor.  Gastrointestinal:  Soft, nondistended, nontender. No rebound or guarding. Normal bowel sounds. No appreciable masses or hepatomegaly. Rectal:  Not performed.  Msk:  Symmetrical without gross deformities. Without edema, no deformity or joint abnormality.  Neurologic:  Alert and  oriented x4;  grossly normal neurologically.  Skin:  Dry and intact without significant lesions or rashes. Psychiatric: Oriented to  person, place and time. Demonstrates good judgement and reason without abnormal affect or behaviors.  RELEVANT LABS AND IMAGING: CBC    Component Value Date/Time   WBC 7.4 07/16/2023 1000   WBC 9.6 06/10/2023 1422   RBC 5.57 07/16/2023 1000   RBC 5.89 (H) 06/10/2023 1422   HGB 16.4 07/16/2023 1000   HCT 49.5 07/16/2023 1000   PLT 174 07/16/2023 1000   MCV 89 07/16/2023 1000   MCH 29.4 07/16/2023 1000   MCH 28.9 06/10/2023 1422   MCHC 33.1 07/16/2023 1000   MCHC 32.6 06/10/2023 1422   RDW 14.8 07/16/2023 1000   LYMPHSABS 1.5 07/16/2023 1000   EOSABS 0.2 07/16/2023 1000   BASOSABS 0.0 07/16/2023 1000    CMP     Component Value Date/Time   NA 143 07/16/2023 1000   K 4.4 07/16/2023 1000   CL 100 07/16/2023 1000   CO2 23 07/16/2023 1000   GLUCOSE 113 (H) 07/16/2023 1000   GLUCOSE 180 (H) 11/24/2016 0305   BUN 31 (H) 07/16/2023 1000   CREATININE 1.39 (H) 07/16/2023 1000   CALCIUM  9.5 07/16/2023 1000   PROT 6.8 11/22/2016 0111   ALBUMIN  3.6 11/22/2016 0111   AST 20 11/22/2016 0111   ALT 17 11/22/2016 0111   ALKPHOS 50 11/22/2016 0111   BILITOT 0.6 11/22/2016 0111   GFRNONAA >60 11/24/2016 0305   GFRAA >60 11/24/2016 0305    Assessment: 1. ***  Plan: 1. ***     Delon Failing, PA-C [facility] 12/22/2023, 1:55 PM  Cc: Frann Mabel Mt*

## 2023-12-23 ENCOUNTER — Encounter: Payer: Self-pay | Admitting: Physician Assistant

## 2023-12-23 ENCOUNTER — Other Ambulatory Visit (INDEPENDENT_AMBULATORY_CARE_PROVIDER_SITE_OTHER)

## 2023-12-23 ENCOUNTER — Ambulatory Visit: Admitting: Physician Assistant

## 2023-12-23 VITALS — BP 142/70 | HR 61 | Ht 72.0 in | Wt 232.2 lb

## 2023-12-23 DIAGNOSIS — R634 Abnormal weight loss: Secondary | ICD-10-CM

## 2023-12-23 DIAGNOSIS — R143 Flatulence: Secondary | ICD-10-CM | POA: Diagnosis not present

## 2023-12-23 DIAGNOSIS — R197 Diarrhea, unspecified: Secondary | ICD-10-CM

## 2023-12-23 LAB — CBC WITH DIFFERENTIAL/PLATELET
Basophils Absolute: 0 K/uL (ref 0.0–0.1)
Basophils Relative: 0.4 % (ref 0.0–3.0)
Eosinophils Absolute: 0.1 K/uL (ref 0.0–0.7)
Eosinophils Relative: 2 % (ref 0.0–5.0)
HCT: 51.1 % (ref 39.0–52.0)
Hemoglobin: 17.3 g/dL — ABNORMAL HIGH (ref 13.0–17.0)
Lymphocytes Relative: 23.5 % (ref 12.0–46.0)
Lymphs Abs: 1.7 K/uL (ref 0.7–4.0)
MCHC: 33.8 g/dL (ref 30.0–36.0)
MCV: 88.7 fl (ref 78.0–100.0)
Monocytes Absolute: 0.5 K/uL (ref 0.1–1.0)
Monocytes Relative: 7 % (ref 3.0–12.0)
Neutro Abs: 5 K/uL (ref 1.4–7.7)
Neutrophils Relative %: 67.1 % (ref 43.0–77.0)
Platelets: 170 K/uL (ref 150.0–400.0)
RBC: 5.76 Mil/uL (ref 4.22–5.81)
RDW: 14.2 % (ref 11.5–15.5)
WBC: 7.4 K/uL (ref 4.0–10.5)

## 2023-12-23 LAB — COMPREHENSIVE METABOLIC PANEL WITH GFR
ALT: 15 U/L (ref 0–53)
AST: 13 U/L (ref 0–37)
Albumin: 4.5 g/dL (ref 3.5–5.2)
Alkaline Phosphatase: 76 U/L (ref 39–117)
BUN: 25 mg/dL — ABNORMAL HIGH (ref 6–23)
CO2: 34 meq/L — ABNORMAL HIGH (ref 19–32)
Calcium: 10.2 mg/dL (ref 8.4–10.5)
Chloride: 99 meq/L (ref 96–112)
Creatinine, Ser: 1.56 mg/dL — ABNORMAL HIGH (ref 0.40–1.50)
GFR: 46.03 mL/min — ABNORMAL LOW (ref >60.00)
Glucose, Bld: 127 mg/dL — ABNORMAL HIGH (ref 70–99)
Potassium: 4.3 meq/L (ref 3.5–5.1)
Sodium: 141 meq/L (ref 135–145)
Total Bilirubin: 0.7 mg/dL (ref 0.2–1.2)
Total Protein: 7.2 g/dL (ref 6.0–8.3)

## 2023-12-23 NOTE — Patient Instructions (Signed)
 Your provider has requested that you go to the basement level for lab work before leaving today. Press B on the elevator. The lab is located at the first door on the left as you exit the elevator.  Due to recent changes in healthcare laws, you may see the results of your imaging and laboratory studies on MyChart before your provider has had a chance to review them.  We understand that in some cases there may be results that are confusing or concerning to you. Not all laboratory results come back in the same time frame and the provider may be waiting for multiple results in order to interpret others.  Please give us  48 hours in order for your provider to thoroughly review all the results before contacting the office for clarification of your results.   _______________________________________________________  If your blood pressure at your visit was 140/90 or greater, please contact your primary care physician to follow up on this.  _______________________________________________________  If you are age 15 or older, your body mass index should be between 23-30. Your Body mass index is 31.5 kg/m. If this is out of the aforementioned range listed, please consider follow up with your Primary Care Provider.  If you are age 74 or younger, your body mass index should be between 19-25. Your Body mass index is 31.5 kg/m. If this is out of the aformentioned range listed, please consider follow up with your Primary Care Provider.   ________________________________________________________  The [facility] providers would like to encourage you to use MYCHART to communicate with providers for non-urgent requests or questions.  Due to long hold times on the telephone, sending your provider a message by [facility] may be a faster and more efficient way to get a response.  Please allow 48 business hours for a response.  Please remember that this is for non-urgent requests.   _______________________________________________________  [facility] is using a team-based approach to care.  Your team is made up of your doctor and two to three APPS. Our APPS (Nurse Practitioners and Physician Assistants) work with your physician to ensure care continuity for you. They are fully qualified to address your health concerns and develop a treatment plan. They communicate directly with your gastroenterologist to care for you. Seeing the Advanced Practice Practitioners on your physician's team can help you by facilitating care more promptly, often allowing for earlier appointments, access to diagnostic testing, procedures, and other specialty referrals.   Thank you for choosing me and [facility].  Delon Failing, PA-C

## 2023-12-23 NOTE — Progress Notes (Signed)
 Attending Physician's Attestation   I have reviewed the chart.   I agree with the Advanced Practitioner's note, impression, and recommendations with any updates as below.    Corliss Parish, MD [facility]oscopy Office # [phone]

## 2023-12-24 LAB — TSH: TSH: 0.83 u[IU]/mL (ref 0.35–5.50)

## 2023-12-24 LAB — IGA: Immunoglobulin A: 266 mg/dL (ref 70–320)

## 2023-12-24 LAB — TISSUE TRANSGLUTAMINASE, IGA: (tTG) Ab, IgA: <1 U/mL

## 2023-12-25 ENCOUNTER — Ambulatory Visit: Payer: Self-pay | Admitting: Physician Assistant

## 2023-12-25 LAB — FECAL LACTOFERRIN, QUANT
Fecal Lactoferrin: NEGATIVE
MICRO NUMBER:: 17200655
SPECIMEN QUALITY:: ADEQUATE

## 2023-12-26 LAB — GI PROFILE, STOOL, PCR

## 2023-12-29 LAB — CALPROTECTIN: Calprotectin: 15 ug/g

## 2023-12-30 NOTE — Progress Notes (Signed)
 Please check in with patient and see how he is doing.  If still with his diarrheal symptoms then would recommend that we complete SIBO breath testing first.  If that is normal then would recommend an EGD and colonoscopy.  Sincerely, Delon Osler, PA-C

## 2024-01-08 DIAGNOSIS — M160 Bilateral primary osteoarthritis of hip: Secondary | ICD-10-CM | POA: Insufficient documentation

## 2024-01-12 ENCOUNTER — Ambulatory Visit: Admitting: Audiologist

## 2024-01-19 ENCOUNTER — Ambulatory Visit

## 2024-01-19 VITALS — BP 122/50 | HR 70 | Ht 72.0 in | Wt 242.4 lb

## 2024-01-19 DIAGNOSIS — I5032 Chronic diastolic (congestive) heart failure: Secondary | ICD-10-CM | POA: Diagnosis not present

## 2024-01-19 DIAGNOSIS — I2510 Atherosclerotic heart disease of native coronary artery without angina pectoris: Secondary | ICD-10-CM

## 2024-01-19 DIAGNOSIS — Z01810 Encounter for preprocedural cardiovascular examination: Secondary | ICD-10-CM

## 2024-01-19 DIAGNOSIS — R42 Dizziness and giddiness: Secondary | ICD-10-CM

## 2024-01-19 NOTE — Patient Instructions (Signed)

## 2024-01-19 NOTE — Assessment & Plan Note (Signed)
 CAD s/p PCI of LAD in 2011 and with worsening angina underwent cardiac cath at PCI of LAD ISR and another cath and PCI of diagonal 1 in 2024  At [facility] asymptomatic. Continue Plavix  75 mg once daily. Continue atorvastatin  40 mg once daily.  On antianginal regimen with ranolazine  500 mg twice daily Imdur  30 mg once daily Metoprolol  tartrate 50 mg twice daily.

## 2024-01-19 NOTE — Assessment & Plan Note (Signed)
 Appears euvolemic and compensated. NYHA class I somewhat limited due to his hip pain.  Continue with low-salt diet less than 2 g/day. Continue with torsemide  20 mg every other day.  Continue Jardiance , on diabetic dose 25 mg once daily. Continue metoprolol  tartrate 50 mg twice daily. Continue lisinopril  5 mg once daily.

## 2024-01-19 NOTE — Assessment & Plan Note (Signed)
 Symptoms improved after making adjustments at his last visit and dose of lisinopril  and Imdur  lowered and torsemide  reduced to every other day.  No further reported symptoms of postural lightheadedness since the changes at last visit.

## 2024-01-19 NOTE — Assessment & Plan Note (Signed)
 awaiting surgery for his hip osteoarthritis and anticipating this tentatively in February 2026.  From cardiac standpoint currently has no active anginal or heart failure symptoms and appears compensated.  Okay to proceed with elective noncardiac surgery as being planned. Overall low perioperative risk for cardiac complications.  Given his CAD history would recommend to continue at least single antiplatelet agent perioperatively. Currently on Plavix  75 mg once daily, ideally this can be continued if agreeable with the surgeon, or switch to aspirin  81 mg once daily if Plavix  is deemed prohibitive.  If any significant change in his functional status prior to surgery, please contact us  for further evaluation.

## 2024-01-19 NOTE — Progress Notes (Signed)
 Cardiology Consultation:    Date:  01/19/2024   ID:  GREYDEN BESECKER, DOB 1955/11/24, MRN [id number]  PCP:  [name], NP  Cardiologist:  Alean JONELLE Kobus, MD   Referring MD: Cyndi Shaver, PA-C   No chief complaint on file.    ASSESSMENT AND PLAN:   Benjamin Bush 68 year old male  history of CHF with preserved LVEF, CAD s/p PCI of LAD in 2011, later in 2024 at [facility] underwent cardiac cath for worsening angina underwent PCI of LAD and diagonal 1, s/p PFO closure in 2015 at [facility], diabetes, hyperlipidemia,left renal mass on CT abdomen [September 2024 and October 2024; CT abdomen 06/19/2023 reported this as left renal cyst], CKD stage III, chronic back pain uses oxycodone  3 times a day, uses nicotine dip (quit 2 days ago).  Has been gradually losing weight close 240 pounds last November], orthostatic lightheadedness, hip pain. Transthoracic echocardiogram 09/17/2023 with LVEF 60 to 65%, grade 1 diastolic dysfunction without significant valve abnormalities.   Here for routine follow-up visit.  Problem List Items Addressed This Visit     CAD (coronary artery disease) (Chronic)   CAD s/p PCI of LAD in 2011 and with worsening angina underwent cardiac cath at PCI of LAD ISR and another cath and PCI of diagonal 1 in 2024  At [facility] asymptomatic. Continue Plavix  75 mg once daily. Continue atorvastatin  40 mg once daily.  On antianginal regimen with ranolazine  500 mg twice daily Imdur  30 mg once daily Metoprolol  tartrate 50 mg twice daily.       Preop cardiovascular exam    awaiting surgery for his hip osteoarthritis and anticipating this tentatively in February 2026.  From cardiac standpoint currently has no active anginal or heart failure symptoms and appears compensated.  Okay to proceed with elective noncardiac surgery as being planned. Overall low perioperative risk for cardiac complications.  Given his CAD history would recommend to continue at least single  antiplatelet agent perioperatively. Currently on Plavix  75 mg once daily, ideally this can be continued if agreeable with the surgeon, or switch to aspirin  81 mg once daily if Plavix  is deemed prohibitive.  If any significant change in his functional status prior to surgery, please contact us  for further evaluation.      (HFpEF) heart failure with preserved ejection fraction (HCC) - Primary   Appears euvolemic and compensated. NYHA class I somewhat limited due to his hip pain.  Continue with low-salt diet less than 2 g/day. Continue with torsemide  20 mg every other day.  Continue Jardiance , on diabetic dose 25 mg once daily. Continue metoprolol  tartrate 50 mg twice daily. Continue lisinopril  5 mg once daily.      Orthostatic lightheadedness   Symptoms improved after making adjustments at his last visit and dose of lisinopril  and Imdur  lowered and torsemide  reduced to every other day.  No further reported symptoms of postural lightheadedness since the changes at last visit.      Return to clinic tentatively in 6 months.  Earlier follow-up on an as-needed basis.   History of Present Illness:    Benjamin Bush is a 68 y.o. male who is being seen today for follow-up visit. PCP is Kelly Schimke at [facility] Last visit with me in the office was 10/15/2023.  Pleasant man here for the visit today by himself.  Has history of CHF with preserved LVEF, CAD s/p PCI of LAD in 2011, later in 2024 at [facility] underwent cardiac cath for worsening angina underwent  PCI of LAD and diagonal 1, s/p PFO closure in 2015 at [facility], diabetes, hyperlipidemia,left renal mass on CT abdomen [September 2024 and October 2024; CT abdomen 06/19/2023 reported this as left renal cyst], CKD stage III, chronic back pain uses oxycodone  3 times a day, uses nicotine dip (quit 2 days ago).  Has been gradually losing weight close 240 pounds last November], orthostatic lightheadedness, hip pain. Transthoracic echocardiogram  09/17/2023 with LVEF 60 to 65%, grade 1 diastolic dysfunction without significant valve abnormalities.  At last office visit with symptoms of postural lightheadedness medications were changed lowering torsemide  to every other day and Imdur  was decreased to 30 mg once daily.  Mentions this is improved his symptoms and he does not have postural lightheadedness.  He does not have any cardiac symptoms at this time.  Has atypical description of chest pain that is sharp and lasting for few seconds, occurs randomly. No significant functional limitations other than due to his hip pain which is bilateral and has recently establish care with orthopedic surgeon at [facility] and is anticipating surgery tentatively in February 2026.  Good compliance with his medications.  Mentions he has quit using nicotine dip 2 days ago.  Recent blood work from 12/31/2023 notes sodium 140, potassium 4.4. BUN 27, creatinine 1.48, eGFR 52 consistent with his known history of CKD stage III. Normal transaminases and alkaline phosphatase. TSH normal 0.83. Last lipid panel to review is from 11/25/2023 total cholesterol 138, triglycerides 223, HDL 30 and LDL 77.   Past Medical History:  Diagnosis Date   (HFpEF) heart failure with preserved ejection fraction (HCC)    a. 06/2016 Echo: Ef >55%, gr1 DD, dil RV w/ nl syst fxn, mildly dil LA.   Atrial septal defect 04/08/2013   BPH (benign prostatic hyperplasia) 11/17/2022   Bronchomalacia 11/08/2021   CAD (coronary artery disease)    a. s/p stenting x 2 (RCA/LCX)- [place], [place]; b. 2014 Cath: mild RCA/LCX ISR.   Chronic diastolic CHF (congestive heart failure) (HCC) 11/22/2016   Chronic pain disorder 06/25/2022   CKD (chronic kidney disease) stage 3, GFR 30-59 ml/min (HCC) 01/01/2017   COPD (chronic obstructive pulmonary disease) (HCC) 05/13/2013   Depression    Diabetes mellitus without complication (HCC)    Erectile dysfunction 11/17/2022   GERD (gastroesophageal  reflux disease) 04/10/2012   Headache    History of coronary artery stent placement 01/01/2017   History of pulmonary embolus (PE) 11/22/2016   HTN (hypertension) 04/10/2012   Hx of percutaneous transcatheter closure of congenital ASD 01/01/2017   Hypercholesteremia    Hyperlipidemia associated with type 2 diabetes mellitus (HCC) 11/12/2014   Hypogonadism in male 11/17/2022   Insomnia 01/03/2022   Ischemic heart disease due to coronary artery obstruction 06/10/2022   Cath on 5/20 w/ PCI to mid LAD for in stent restonosis 80% of LAD. On plavix  and aspirin . Started on O2 while being monitored after this procedure. No recurrent angina, did go to ED for chest pain that was relieved w/ GI cocktail and increasing PPI.     Lesion of left native kidney 09/02/2023   Long-term current use of opiate analgesic 03/25/2021   Lumbar spondylosis 03/25/2021   Morbid obesity (HCC)    Neurocognitive disorder 03/16/2019   OA (osteoarthritis) of hip 03/03/2022   Orthostatic lightheadedness 08/28/2023   OSA (obstructive sleep apnea)    Osteoarthritis of knee 03/25/2021   Osteoarthritis of metacarpophalangeal (MCP) joint 05/01/2022   Chronic right hand pain across dorsum, pain with most movements. Suspect  arthritis. XR today. Also has some carpal tunnel in that wrist, advised splint at night.     PFO (patent foramen ovale)    a. s/p percutaneous closure @ [name]).   PUD (peptic ulcer disease)    Recurrent falls 01/19/2019   Renal mass 06/10/2023   Suicidal ideation 03/16/2019   Type 2 diabetes mellitus with chronic kidney disease, with long-term current use of insulin  (HCC) 04/10/2016   50 units NPH twice daily, continue glipizide  10 twice daily  FBG have been improving, want to add a med or meal time. But costs have been issue. Will try to work on that.  Needs A1c < 7 if he needs eventually to have a knee replacement     -try to start jardiance  through mail order     Last A1c:  Lab Results    Component Value Date    A1C 9.0 (H) 07/16/2022    A1C 9.6 (A) 06/10/2022    A1C 7.4 (A)    Type 2 diabetes mellitus with hyperglycemia (HCC) 04/10/2012    Past Surgical History:  Procedure Laterality Date   CORONARY ANGIOPLASTY WITH STENT PLACEMENT     HERNIA REPAIR     JOINT REPLACEMENT Left    knee replacement   PROSTATE SURGERY     unspec.   SHOULDER SURGERY     vetebral fusion      Current Medications: Current Meds  Medication Sig   atorvastatin  (LIPITOR ) 40 MG tablet Take 1 tablet (40 mg total) by mouth daily.   clopidogrel  (PLAVIX ) 75 MG tablet Take 1 tablet (75 mg total) by mouth daily with breakfast.   empagliflozin  (JARDIANCE ) 25 MG TABS tablet Take 1 tablet (25 mg total) by mouth daily before breakfast.   Evolocumab  (REPATHA  SURECLICK) 140 MG/ML SOAJ Inject 140 mg into the skin every 14 (fourteen) days.   gabapentin  (NEURONTIN ) 300 MG capsule Take 300 mg by mouth 3 (three) times daily as needed.   isosorbide  mononitrate (IMDUR ) 30 MG 24 hr tablet Take 1 tablet (30 mg total) by mouth daily.   lisinopril  (ZESTRIL ) 5 MG tablet Take 1 tablet (5 mg total) by mouth daily.   metFORMIN  (GLUCOPHAGE -XR) 500 MG 24 hr tablet Take 500 mg by mouth every morning.   metoprolol  tartrate (LOPRESSOR ) 50 MG tablet Take 1 tablet (50 mg total) by mouth 2 (two) times daily.   nitroGLYCERIN  (NITROSTAT ) 0.4 MG SL tablet Place 1 tablet (0.4 mg total) under the tongue every 5 (five) minutes x 2 doses as needed for chest pain.   oxyCODONE -acetaminophen  (PERCOCET) 10-325 MG tablet Take 1 tablet by mouth 3 (three) times daily as needed.   pantoprazole (PROTONIX) 40 MG tablet Take 40 mg by mouth daily.   ranolazine  (RANEXA ) 500 MG 12 hr tablet Take 500 mg by mouth 2 (two) times daily.   sertraline  (ZOLOFT ) 50 MG tablet Take 1 tablet (50 mg total) by mouth daily.   sucralfate  (CARAFATE ) 1 g tablet TAKE 1 TABLET BY MOUTH TWICE DAILY WITH A MEAL   testosterone  cypionate (DEPOTESTOSTERONE CYPIONATE) 200  MG/ML injection Inject into the muscle.   tiZANidine (ZANAFLEX) 4 MG tablet Take 4 mg by mouth every 12 (twelve) hours as needed.   torsemide  (DEMADEX ) 20 MG tablet Take 1 tablet (20 mg total) by mouth every other day.     Allergies:   Codeine and Metformin    Social History   Socioeconomic History   Marital status: Divorced    Spouse name: Not on file  Number of children: 2   Years of education: Not on file   Highest education level: High school graduate  Occupational History    Comment: Disabled  Tobacco Use   Smoking status: Some Days    Types: Cigarettes   Smokeless tobacco: Current    Types: Chew   Tobacco comments:    Chews tobacco  Vaping Use   Vaping status: Never Used  Substance and Sexual Activity   Alcohol use: No   Drug use: No   Sexual activity: Yes  Other Topics Concern   Not on file  Social History Narrative   Not on file   Social Drivers of Health   Financial Resource Strain: Low Risk  (11/20/2023)   Overall Financial Resource Strain (CARDIA)    Difficulty of Paying Living Expenses: Not very hard  Food Insecurity: Low Risk  (12/31/2023)   Received from [facility]   Hunger Vital Sign    Within the past 12 months, you worried that your food would run out before you got money to buy more: Never true    Within the past 12 months, the food you bought just didn't last and you didn't have money to get more. : Never true  Transportation Needs: No Transportation Needs (12/31/2023)   Received from [facility]    In the past 12 months, has lack of reliable transportation kept you from medical appointments, meetings, work or from getting things needed for daily living? : No  Physical Activity: Sufficiently Active (11/20/2023)   Exercise Vital Sign    Days of Exercise per Week: 7 days    Minutes of Exercise per Session: 40 min  Stress: Stress Concern Present (11/20/2023)   [employer] of Occupational Health - Occupational Stress  Questionnaire    Feeling of Stress: Rather much  Social Connections: Moderately Isolated (11/20/2023)   Social Connection and Isolation Panel    Frequency of Communication with Friends and Family: Once a week    Frequency of Social Gatherings with Friends and Family: More than three times a week    Attends Religious Services: More than 4 times per year    Active Member of [employer] or Organizations: No    Attends Engineer, Structural: Never    Marital Status: Divorced     Family History: The patient's family history includes CAD in his father and mother; Cancer - Lung in his brother; Hypertension in his mother; Lymphoma in his mother. ROS:   Please see the history of present illness.    All 14 point review of systems negative except as described per history of present illness.  EKGs/Labs/Other Studies Reviewed:    The following studies were reviewed today:   EKG:       Recent Labs: 12/23/2023: ALT 15; BUN 25; Creatinine, Ser 1.56; Hemoglobin 17.3; Platelets 170.0; Potassium 4.3; Sodium 141; TSH 0.83  Recent Lipid Panel    Component Value Date/Time   CHOL 121 05/19/2023 0802   TRIG 186 (H) 05/19/2023 0802   HDL 27 (L) 05/19/2023 0802   CHOLHDL 4.5 05/19/2023 0802   CHOLHDL 5.2 11/22/2016 1048   VLDL 55 (H) 11/22/2016 1048   LDLCALC 63 05/19/2023 0802    Physical Exam:    VS:  BP (!) 122/50   Pulse 70   Ht 6' (1.829 m)   Wt 242 lb 6.4 oz (110 kg)   SpO2 97%   BMI 32.88 kg/m     Wt Readings from Last 3 Encounters:  01/19/24 242 lb 6.4 oz (110 kg)  12/23/23 232 lb 4 oz (105.3 kg)  12/03/23 238 lb 9.6 oz (108.2 kg)     GENERAL:  Well nourished, well developed in no acute distress NECK: No JVD; No carotid bruits CARDIAC: RRR, S1 and S2 present, no murmurs, no rubs, no gallops CHEST:  Clear to auscultation without rales, wheezing or rhonchi  Extremities: No pitting pedal edema. Pulses bilaterally symmetric with radial 2+ and dorsalis pedis 2+ NEUROLOGIC:  Alert  and oriented x 3  Medication Adjustments/Labs and Tests Ordered: Current medicines are reviewed at length with the patient today.  Concerns regarding medicines are outlined above.  No orders of the defined types were placed in this encounter.  No orders of the defined types were placed in this encounter.   Signed, Alean jess Kobus, MD, MPH, [facility]. 01/19/2024 3:02 PM    [facility]

## 2024-01-20 ENCOUNTER — Ambulatory Visit: Admitting: Urology

## 2024-02-13 ENCOUNTER — Other Ambulatory Visit: Payer: Self-pay | Admitting: Internal Medicine

## 2024-02-24 NOTE — Progress Notes (Signed)
 "  Chief Complaint: Bloating and gassy  HPI:    [name] is a 69 year old Caucasian male, assigned to Dr. Wilhelmenia, with a past medical history as listed below including CAD status post stenting on Plavix  (08/28/2023 echo with LVEF 60-65%), CHF, CKD stage III, COPD, GERD, diabetes, OSA, hypertension and multiple others, who was referred to me by [name], NP for a complaint of being bloated and gassy.      06/10/23 Cologuard negative.    06/19/2023 CTAP without contrast done for abdominal pain showed no acute findings in the abdomen or pelvis.    11/25/23 TSH normal, CBC normal, cortisol normal, BMP with a creatinine of 1.32 and a glucose of 142.    12/03/2023 patient seen by PCP.  At that time discussed experiencing GI distress from metformin  which led to its discontinuation.  On Jardiance  for diabetes.  At that time ongoing diarrhea, gas and bloating inhibiting activities.  Chronic low back pain.  At that time recommended to try Metamucil and adequate hydration    12/23/2023 patient seen in clinic with his partner at that time they discussed that he had been on a diet and lost around 100 pounds.  Over the past 6 months had watery loose stools 3-4 times in the morning that were urgent.  Also foul-smelling flatulence.  Discussed umbilical discomfort, discussed history of umbilical hernia repaired on 3 separate occasions, last 10 years ago.  At that time labs including CBC, CMP, IgA, high TTG and TSH, stool studies, GI path panel, fecal Cal and fecal lactoferrin.  Discussed using Imodium 1-2 tabs as needed prior to leaving the house.  Discussed testing for SIBO +/- empiric treatment if stool studies were unrevealing.  Also consider EGD and colonoscopy.  Recommend he discontinue diet [facility] and sugar-free products.    12/23/2023 CMP with a glucose of 127, creatinine 1.56 BUN 25.  TSH normal.  Celiac testing negative.  CBC normal.    12/24/2023 stool studies negative/normal.  Discussed the use  of AI scribe software for clinical note transcription with the patient, who gave verbal consent to proceed.  History of Present Illness Over the past three to four months, bowel habits have shifted from prior diarrhea to constipation, with straining during defecation and occasional rectal bleeding, which he attributes to hemorrhoids. The patient reports that rectal bleeding is not present when he does not have to strain during bowel movements. Weight has remained stable, with 242 pounds in December and 241 pounds at the current visit.  Miralax is used intermittently, approximately every two to three days mixed in 16 ounces of water, resulting in a bowel movement every couple of days. He does not use Miralax daily. A stool softener was recently recommended by his diabetic doctor, but he continues to use Miralax as needed. The patient reports that he does not notice rectal bleeding when he does not have to strain.  He experiences persistent, foul-smelling gas that has impacted social activities, including attending church. The gas is present regardless of bowel movement frequency and is not associated with abdominal pain or discomfort. Stomach growling and nocturnal bowel sounds are noted. He has reduced carbonated beverages, switching from diet [facility] to flavored water, in an effort to decrease gas.  Metformin  XR was discontinued earlier this week by his diabetic doctor to assess for improvement in gas symptoms. No abdominal pain or discomfort is reported.  Denies fever, chills, continued weight loss, nausea, vomiting or abdominal pain.    Past  Medical History:  Diagnosis Date   (HFpEF) heart failure with preserved ejection fraction (HCC)    a. 06/2016 Echo: Ef >55%, gr1 DD, dil RV w/ nl syst fxn, mildly dil LA.   Atrial septal defect 04/08/2013   BPH (benign prostatic hyperplasia) 11/17/2022   Bronchomalacia 11/08/2021   CAD (coronary artery disease)    a. s/p stenting x 2 (RCA/LCX)-  [place], [place]; b. 2014 Cath: mild RCA/LCX ISR.   Chronic diastolic CHF (congestive heart failure) (HCC) 11/22/2016   Chronic pain disorder 06/25/2022   CKD (chronic kidney disease) stage 3, GFR 30-59 ml/min (HCC) 01/01/2017   COPD (chronic obstructive pulmonary disease) (HCC) 05/13/2013   Depression    Diabetes mellitus without complication (HCC)    Erectile dysfunction 11/17/2022   GERD (gastroesophageal reflux disease) 04/10/2012   Headache    History of coronary artery stent placement 01/01/2017   History of pulmonary embolus (PE) 11/22/2016   HTN (hypertension) 04/10/2012   Hx of percutaneous transcatheter closure of congenital ASD 01/01/2017   Hypercholesteremia    Hyperlipidemia associated with type 2 diabetes mellitus (HCC) 11/12/2014   Hypogonadism in male 11/17/2022   Insomnia 01/03/2022   Ischemic heart disease due to coronary artery obstruction 06/10/2022   Cath on 5/20 w/ PCI to mid LAD for in stent restonosis 80% of LAD. On plavix  and aspirin . Started on O2 while being monitored after this procedure. No recurrent angina, did go to ED for chest pain that was relieved w/ GI cocktail and increasing PPI.     Lesion of left native kidney 09/02/2023   Long-term current use of opiate analgesic 03/25/2021   Lumbar spondylosis 03/25/2021   Morbid obesity (HCC)    Neurocognitive disorder 03/16/2019   OA (osteoarthritis) of hip 03/03/2022   Orthostatic lightheadedness 08/28/2023   OSA (obstructive sleep apnea)    Osteoarthritis of knee 03/25/2021   Osteoarthritis of metacarpophalangeal (MCP) joint 05/01/2022   Chronic right hand pain across dorsum, pain with most movements. Suspect arthritis. XR today. Also has some carpal tunnel in that wrist, advised splint at night.     PFO (patent foramen ovale)    a. s/p percutaneous closure @ [name], MD).   PUD (peptic ulcer disease)    Recurrent falls 01/19/2019   Renal mass 06/10/2023   Suicidal ideation 03/16/2019   Type 2  diabetes mellitus with chronic kidney disease, with long-term current use of insulin  (HCC) 04/10/2016   50 units NPH twice daily, continue glipizide  10 twice daily  FBG have been improving, want to add a med or meal time. But costs have been issue. Will try to work on that.  Needs A1c < 7 if he needs eventually to have a knee replacement     -try to start jardiance  through mail order     Last A1c:  Lab Results   Component Value Date    A1C 9.0 (H) 07/16/2022    A1C 9.6 (A) 06/10/2022    A1C 7.4 (A)    Type 2 diabetes mellitus with hyperglycemia (HCC) 04/10/2012    Past Surgical History:  Procedure Laterality Date   CORONARY ANGIOPLASTY WITH STENT PLACEMENT     HERNIA REPAIR     JOINT REPLACEMENT Left    knee replacement   PROSTATE SURGERY     unspec.   SHOULDER SURGERY     vetebral fusion      Current Outpatient Medications  Medication Sig Dispense Refill   atorvastatin  (LIPITOR ) 40 MG tablet Take 1 tablet (40  mg total) by mouth daily. 90 tablet 3   clopidogrel  (PLAVIX ) 75 MG tablet Take 1 tablet (75 mg total) by mouth daily with breakfast. 90 tablet 3   empagliflozin  (JARDIANCE ) 25 MG TABS tablet Take 1 tablet (25 mg total) by mouth daily before breakfast. 30 tablet 2   Evolocumab  (REPATHA  SURECLICK) 140 MG/ML SOAJ Inject 140 mg into the skin every 14 (fourteen) days. 6 mL 1   gabapentin  (NEURONTIN ) 300 MG capsule Take 300 mg by mouth 3 (three) times daily as needed.     isosorbide  mononitrate (IMDUR ) 30 MG 24 hr tablet Take 1 tablet (30 mg total) by mouth daily. 90 tablet 3   lisinopril  (ZESTRIL ) 5 MG tablet Take 1 tablet (5 mg total) by mouth daily. 90 tablet 3   metFORMIN  (GLUCOPHAGE -XR) 500 MG 24 hr tablet Take 500 mg by mouth every morning.     metoprolol  tartrate (LOPRESSOR ) 50 MG tablet Take 1 tablet (50 mg total) by mouth 2 (two) times daily. 180 tablet 3   nitroGLYCERIN  (NITROSTAT ) 0.4 MG SL tablet Place 1 tablet (0.4 mg total) under the tongue every 5 (five) minutes x 2 doses  as needed for chest pain. 30 tablet 3   oxyCODONE -acetaminophen  (PERCOCET) 10-325 MG tablet Take 1 tablet by mouth 3 (three) times daily as needed.     pantoprazole (PROTONIX) 40 MG tablet Take 40 mg by mouth daily.     ranolazine  (RANEXA ) 500 MG 12 hr tablet Take 500 mg by mouth 2 (two) times daily.     sertraline  (ZOLOFT ) 50 MG tablet Take 1 tablet (50 mg total) by mouth daily. 90 tablet 0   sucralfate  (CARAFATE ) 1 g tablet TAKE 1 TABLET BY MOUTH TWICE DAILY WITH A MEAL 60 tablet 0   testosterone  cypionate (DEPOTESTOSTERONE CYPIONATE) 200 MG/ML injection Inject into the muscle.     tiZANidine (ZANAFLEX) 4 MG tablet Take 4 mg by mouth every 12 (twelve) hours as needed.     torsemide  (DEMADEX ) 20 MG tablet Take 1 tablet (20 mg total) by mouth every other day. 45 tablet 3   No current facility-administered medications for this visit.    Allergies as of 02/25/2024 - Review Complete 01/19/2024  Allergen Reaction Noted   Codeine Swelling 02/24/2013   Metformin  Nausea Only 11/23/2023    Family History  Problem Relation Age of Onset   CAD Mother        MI in her 64s   Hypertension Mother    Lymphoma Mother    CAD Father        CABG in his 21s   Cancer - Lung Brother     Social History   Socioeconomic History   Marital status: Divorced    Spouse name: Not on file   Number of children: 2   Years of education: Not on file   Highest education level: High school graduate  Occupational History    Comment: Disabled  Tobacco Use   Smoking status: Some Days    Types: Cigarettes   Smokeless tobacco: Current    Types: Chew   Tobacco comments:    Chews tobacco  Vaping Use   Vaping status: Never Used  Substance and Sexual Activity   Alcohol use: No   Drug use: No   Sexual activity: Yes  Other Topics Concern   Not on file  Social History Narrative   Not on file   Social Drivers of Health   Tobacco Use: High Risk (01/19/2024)   Patient History  Smoking Tobacco Use: Some Days     Smokeless Tobacco Use: Current    Passive Exposure: Not on file  Financial Resource Strain: Low Risk (11/20/2023)   Overall Financial Resource Strain (CARDIA)    Difficulty of Paying Living Expenses: Not very hard  Food Insecurity: Low Risk (12/31/2023)   Received from [facility]   Epic    Within the past 12 months, you worried that your food would run out before you got money to buy more: Never true    Within the past 12 months, the food you bought just didn't last and you didn't have money to get more. : Never true  Transportation Needs: No Transportation Needs (12/31/2023)   Received from [facility]    In the past 12 months, has lack of reliable transportation kept you from medical appointments, meetings, work or from getting things needed for daily living? : No  Physical Activity: Sufficiently Active (11/20/2023)   Exercise Vital Sign    Days of Exercise per Week: 7 days    Minutes of Exercise per Session: 40 min  Stress: Stress Concern Present (11/20/2023)   [employer] of Occupational Health - Occupational Stress Questionnaire    Feeling of Stress: Rather much  Social Connections: Moderately Isolated (11/20/2023)   Social Connection and Isolation Panel    Frequency of Communication with Friends and Family: Once a week    Frequency of Social Gatherings with Friends and Family: More than three times a week    Attends Religious Services: More than 4 times per year    Active Member of [employer] or Organizations: No    Attends Banker Meetings: Never    Marital Status: Divorced  Catering Manager Violence: Not At Risk (11/20/2023)   Epic    Fear of Current or Ex-Partner: No    Emotionally Abused: No    Physically Abused: No    Sexually Abused: No  Depression (PHQ2-9): Medium Risk (12/03/2023)   Depression (PHQ2-9)    PHQ-2 Score: 6  Alcohol Screen: Low Risk (11/20/2023)   Alcohol Screen    Last Alcohol Screening Score (AUDIT): 0  Housing:  Low Risk (12/31/2023)   Received from [facility]   Epic    What is your living situation today?: I have a steady place to live    Think about the place you live. Do you have problems with any of the following? Choose all that apply:: None/None on this list  Recent Concern: Housing - High Risk (11/20/2023)   Epic    Unable to Pay for Housing in the Last Year: Yes    Number of Times Moved in the Last Year: 1    Homeless in the Last Year: Yes  Utilities: Low Risk (12/31/2023)   Received from [facility]   Utilities    In the past 12 months has the electric, gas, oil, or water company threatened to shut off services in your home? : No  Health Literacy: Adequate Health Literacy (11/20/2023)   B1300 Health Literacy    Frequency of need for help with medical instructions: Never    Review of Systems:    Constitutional: No weight loss, fever or chills Cardiovascular: No chest pain  Respiratory: No SOB  Gastrointestinal: See HPI and otherwise negative   Physical Exam:  Vital signs: BP 120/64   Pulse 77   Ht 6' (1.829 m)   Wt 241 lb 9.6 oz (109.6 kg)   SpO2 98%   BMI 32.77  kg/m   Constitutional:   Pleasant Caucasian male appears to be in NAD, Well developed, Well nourished, alert and cooperative Respiratory: Respirations even and unlabored. Lungs clear to auscultation bilaterally.   No wheezes, crackles, or rhonchi.  Cardiovascular: Normal S1, S2. No MRG. Regular rate and rhythm. No peripheral edema, cyanosis or pallor.  Gastrointestinal:  Soft, nondistended, nontender. No rebound or guarding. Normal bowel sounds. No appreciable masses or hepatomegaly. Rectal:  Not performed.  Psychiatric: Oriented to person, place and time. Demonstrates good judgement and reason without abnormal affect or behaviors.  RELEVANT LABS AND IMAGING: CBC    Component Value Date/Time   WBC 7.4 12/23/2023 1135   RBC 5.76 12/23/2023 1135   HGB 17.3 (H) 12/23/2023 1135   HGB 16.4 07/16/2023 1000   HCT  51.1 12/23/2023 1135   HCT 49.5 07/16/2023 1000   PLT 170.0 12/23/2023 1135   PLT 174 07/16/2023 1000   MCV 88.7 12/23/2023 1135   MCV 89 07/16/2023 1000   MCH 29.4 07/16/2023 1000   MCH 28.9 06/10/2023 1422   MCHC 33.8 12/23/2023 1135   RDW 14.2 12/23/2023 1135   RDW 14.8 07/16/2023 1000   LYMPHSABS 1.7 12/23/2023 1135   LYMPHSABS 1.5 07/16/2023 1000   MONOABS 0.5 12/23/2023 1135   EOSABS 0.1 12/23/2023 1135   EOSABS 0.2 07/16/2023 1000   BASOSABS 0.0 12/23/2023 1135   BASOSABS 0.0 07/16/2023 1000    CMP     Component Value Date/Time   NA 141 12/23/2023 1135   NA 143 07/16/2023 1000   K 4.3 12/23/2023 1135   CL 99 12/23/2023 1135   CO2 34 (H) 12/23/2023 1135   GLUCOSE 127 (H) 12/23/2023 1135   BUN 25 (H) 12/23/2023 1135   BUN 31 (H) 07/16/2023 1000   CREATININE 1.56 (H) 12/23/2023 1135   CALCIUM  10.2 12/23/2023 1135   PROT 7.2 12/23/2023 1135   ALBUMIN  4.5 12/23/2023 1135   AST 13 12/23/2023 1135   ALT 15 12/23/2023 1135   ALKPHOS 76 12/23/2023 1135   BILITOT 0.7 12/23/2023 1135   GFRNONAA >60 11/24/2016 0305   GFRAA >60 11/24/2016 0305    Assessment & Plan Constipation with hemorrhoids Chronic constipation with intermittent hemorrhoidal bleeding, likely exacerbated by straining and dietary factors. Symptoms have improved with Miralax. - Recommended increasing Miralax to every other day to enhance stool frequency and minimize straining as well as hopefully gas. - Advised continuation of Miralax as long as it remains effective and well-tolerated. - Discussed equivalence of Miralax and stool softeners for his symptoms. - Encouraged reduction of carbonated beverages to decrease gas and bloating.  Suspected small intestinal bacterial overgrowth (SIBO) Persistent flatulence and bloating with foul odor, significantly impacting quality of life and raising suspicion for SIBO. Empiric therapy selected per his preference to proceed without confirmatory testing. -  Prescribed Xifaxan  (rifaximin ) three times daily for two weeks as empiric therapy. - Advised notification if Xifaxan  is not approved by insurance or is cost prohibitive; alternative antibiotics such as metronidazole may be considered.  IBS-D-previously with diarrheal symptoms, stool studies negative, now more of a constipation picture  Patient to follow in clinic with me in 2 to 3 months or sooner if necessary.  If symptoms continue would discuss colonoscopy which patient had previously declined.   Delon Failing, PA-C [facility] 02/24/2024, 2:23 PM  Cc: Yacopino, Jessica L, NP  "

## 2024-02-25 ENCOUNTER — Encounter: Payer: Self-pay | Admitting: Physician Assistant

## 2024-02-25 ENCOUNTER — Ambulatory Visit: Admitting: Physician Assistant

## 2024-02-25 VITALS — BP 120/64 | HR 77 | Ht 72.0 in | Wt 241.6 lb

## 2024-02-25 DIAGNOSIS — K5900 Constipation, unspecified: Secondary | ICD-10-CM

## 2024-02-25 DIAGNOSIS — R140 Abdominal distension (gaseous): Secondary | ICD-10-CM

## 2024-02-25 DIAGNOSIS — K580 Irritable bowel syndrome with diarrhea: Secondary | ICD-10-CM

## 2024-02-25 DIAGNOSIS — R143 Flatulence: Secondary | ICD-10-CM

## 2024-02-25 DIAGNOSIS — K649 Unspecified hemorrhoids: Secondary | ICD-10-CM | POA: Diagnosis not present

## 2024-02-25 DIAGNOSIS — K5909 Other constipation: Secondary | ICD-10-CM | POA: Diagnosis not present

## 2024-02-25 MED ORDER — RIFAXIMIN 550 MG PO TABS: 550.0000 mg | ORAL_TABLET | Freq: Three times a day (TID) | ORAL | 0 refills | Status: DC

## 2024-02-25 NOTE — Patient Instructions (Signed)
 Take your Miralax every other day as needed.   We have sent the following medications to your pharmacy for you to pick up at your convenience: Xifaxan    _______________________________________________________  If your blood pressure at your visit was 140/90 or greater, please contact your primary care physician to follow up on this.  _______________________________________________________  If you are age 67 or older, your body mass index should be between 23-30. Your Body mass index is 32.77 kg/m. If this is out of the aforementioned range listed, please consider follow up with your Primary Care Provider.  If you are age 22 or younger, your body mass index should be between 19-25. Your Body mass index is 32.77 kg/m. If this is out of the aformentioned range listed, please consider follow up with your Primary Care Provider.   ________________________________________________________  The [facility] providers would like to encourage you to use MYCHART to communicate with providers for non-urgent requests or questions.  Due to long hold times on the telephone, sending your provider a message by [facility] may be a faster and more efficient way to get a response.  Please allow 48 business hours for a response.  Please remember that this is for non-urgent requests.  _______________________________________________________  [facility] is using a team-based approach to care.  Your team is made up of your doctor and two to three APPS. Our APPS (Nurse Practitioners and Physician Assistants) work with your physician to ensure care continuity for you. They are fully qualified to address your health concerns and develop a treatment plan. They communicate directly with your gastroenterologist to care for you. Seeing the Advanced Practice Practitioners on your physician's team can help you by facilitating care more promptly, often allowing for earlier appointments, access to diagnostic testing,  procedures, and other specialty referrals.   Thank you for choosing me and [facility].  Delon Failing, PA-C

## 2024-02-25 NOTE — Progress Notes (Signed)
"   Attending Physician's Attestation   I have reviewed the chart.   I agree with the Advanced Practitioner's note, impression, and recommendations with any updates as below. Agree if symptoms persist after workup as outlined above the colonoscopy and endoscopic evaluation is reasonable, but patient has declined this previously.  Time will tell hopefully things improve if not endoscopic evaluation strongly considered again.   Aloha Finner, MD [facility] Advanced Endoscopy Office # [phone]  "

## 2024-03-01 ENCOUNTER — Other Ambulatory Visit (HOSPITAL_COMMUNITY): Payer: Self-pay

## 2024-03-01 ENCOUNTER — Telehealth: Payer: Self-pay

## 2024-03-01 ENCOUNTER — Other Ambulatory Visit: Payer: Self-pay | Admitting: Internal Medicine

## 2024-03-01 NOTE — Telephone Encounter (Signed)
 Pharmacy Patient Advocate Encounter   Received notification from CoverMyMeds that prior authorization for Xifaxan  550MG  tablets is required/requested.   Insurance verification completed.   The patient is insured through [facility].   Prior Authorization for Xifaxan  550MG  tablets has been APPROVED from 03-01-2024 to 03-15-2024. Ran test claim, Copay is $828.41**. This test claim was processed through [facility]- copay amounts may vary at other pharmacies due to pharmacy/plan contracts, or as the patient moves through the different stages of their insurance plan.   PA #/Case ID/Reference #: [id number]   **Patient is eligible for the Medicare Prescription Payment Plan Program. Patient can call insurance for further information and to sign up.

## 2024-03-04 ENCOUNTER — Telehealth: Payer: Self-pay | Admitting: Physician Assistant

## 2024-03-04 NOTE — Telephone Encounter (Signed)
 Inbound call from patient stating his symptoms have worsen and would like to speak to nurse and be advised on what to do. Please advise  Thank you

## 2024-03-07 ENCOUNTER — Telehealth: Payer: Self-pay | Admitting: Physician Assistant

## 2024-03-07 MED ORDER — METRONIDAZOLE 250 MG PO TABS: 250.0000 mg | ORAL_TABLET | Freq: Three times a day (TID) | ORAL | 0 refills | Status: AC

## 2024-03-07 NOTE — Telephone Encounter (Signed)
 See telephone encounter of 03/07/24.

## 2024-03-07 NOTE — Telephone Encounter (Signed)
 Patient says the gas is worse. Can't even stand up without it happening.  Also unable to afford the Xifaxan .

## 2024-03-07 NOTE — Telephone Encounter (Signed)
 Inbound call from patient stating that he can not afford Xifaxan  due to it being 828.41. Patient stated that his gas has been getting worse.Patient is requesting an alternative medication. Please advise.

## 2024-03-07 NOTE — Telephone Encounter (Signed)
 Verbal order from provider Delon Failing, PA-C Flagyl  250 mg TID x 10 days. Patient is advised and agrees to this plan of care. No further questions at this time.

## 2024-03-08 ENCOUNTER — Other Ambulatory Visit: Payer: Self-pay | Admitting: Family Medicine

## 2024-03-23 NOTE — Telephone Encounter (Signed)
 In accordance with refill protocols, please review and address the following requirements before this medication refill can be authorized:  Labs  Pt needs labs within 365 days within normal range. Pt has an upcoming appt on 07/25/2024 with Dr. Liborio

## 2024-03-23 NOTE — Telephone Encounter (Signed)
 Patient is calling to check status of refill. 6 month Follow up is scheduled.

## 2024-03-24 ENCOUNTER — Other Ambulatory Visit: Payer: Self-pay | Admitting: Family Medicine

## 2024-04-06 ENCOUNTER — Ambulatory Visit: Admitting: Student

## 2024-07-25 ENCOUNTER — Ambulatory Visit

## 2024-11-24 ENCOUNTER — Ambulatory Visit
